# Patient Record
Sex: Female | Born: 1985 | Race: White | Hispanic: No | Marital: Single | Smoking: Current every day smoker
Health system: Southern US, Community
[De-identification: ages and names within clinical notes are randomized; demographics above are authoritative.]

## PROBLEM LIST (undated history)

## (undated) DIAGNOSIS — R319 Hematuria, unspecified: Secondary | ICD-10-CM

## (undated) DIAGNOSIS — M6282 Rhabdomyolysis: Principal | ICD-10-CM

## (undated) DIAGNOSIS — M6289 Other specified disorders of muscle: Secondary | ICD-10-CM

## (undated) DIAGNOSIS — Z87898 Personal history of other specified conditions: Secondary | ICD-10-CM

## (undated) DIAGNOSIS — R011 Cardiac murmur, unspecified: Secondary | ICD-10-CM

## (undated) DIAGNOSIS — I96 Gangrene, not elsewhere classified: Secondary | ICD-10-CM

## (undated) DIAGNOSIS — F1910 Other psychoactive substance abuse, uncomplicated: Secondary | ICD-10-CM

## (undated) DIAGNOSIS — F419 Anxiety disorder, unspecified: Secondary | ICD-10-CM

## (undated) HISTORY — PX: BACK SURGERY: SHX140

---
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
## 2006-03-11 ENCOUNTER — Emergency Department (HOSPITAL_COMMUNITY): Admission: EM | Admit: 2006-03-11 | Discharge: 2006-03-12 | Payer: Self-pay | Admitting: Emergency Medicine

## 2006-05-21 ENCOUNTER — Emergency Department (HOSPITAL_COMMUNITY): Admission: EM | Admit: 2006-05-21 | Discharge: 2006-05-21 | Payer: Self-pay | Admitting: Emergency Medicine

## 2006-08-02 ENCOUNTER — Emergency Department (HOSPITAL_COMMUNITY): Admission: EM | Admit: 2006-08-02 | Discharge: 2006-08-02 | Payer: Self-pay | Admitting: Emergency Medicine

## 2006-08-06 ENCOUNTER — Inpatient Hospital Stay (HOSPITAL_COMMUNITY): Admission: EM | Admit: 2006-08-06 | Discharge: 2006-08-10 | Payer: Self-pay | Admitting: Emergency Medicine

## 2006-08-11 ENCOUNTER — Observation Stay (HOSPITAL_COMMUNITY): Admission: EM | Admit: 2006-08-11 | Discharge: 2006-08-12 | Payer: Self-pay | Admitting: Emergency Medicine

## 2007-03-21 ENCOUNTER — Ambulatory Visit: Payer: Self-pay | Admitting: Obstetrics & Gynecology

## 2007-03-21 ENCOUNTER — Inpatient Hospital Stay (HOSPITAL_COMMUNITY): Admission: AD | Admit: 2007-03-21 | Discharge: 2007-03-23 | Payer: Self-pay | Admitting: Obstetrics & Gynecology

## 2007-11-03 ENCOUNTER — Emergency Department (HOSPITAL_COMMUNITY): Admission: EM | Admit: 2007-11-03 | Discharge: 2007-11-03 | Payer: Self-pay | Admitting: Emergency Medicine

## 2008-05-31 ENCOUNTER — Emergency Department (HOSPITAL_COMMUNITY): Admission: EM | Admit: 2008-05-31 | Discharge: 2008-05-31 | Payer: Self-pay | Admitting: Emergency Medicine

## 2008-07-17 ENCOUNTER — Emergency Department (HOSPITAL_COMMUNITY): Admission: EM | Admit: 2008-07-17 | Discharge: 2008-07-17 | Payer: Self-pay | Admitting: Emergency Medicine

## 2008-09-30 ENCOUNTER — Emergency Department (HOSPITAL_COMMUNITY): Admission: EM | Admit: 2008-09-30 | Discharge: 2008-09-30 | Payer: Self-pay | Admitting: Emergency Medicine

## 2008-11-13 ENCOUNTER — Emergency Department (HOSPITAL_COMMUNITY): Admission: EM | Admit: 2008-11-13 | Discharge: 2008-11-13 | Payer: Self-pay | Admitting: Emergency Medicine

## 2009-03-03 ENCOUNTER — Emergency Department (HOSPITAL_COMMUNITY): Admission: EM | Admit: 2009-03-03 | Discharge: 2009-03-03 | Payer: Self-pay | Admitting: Emergency Medicine

## 2009-03-04 ENCOUNTER — Other Ambulatory Visit: Admission: RE | Admit: 2009-03-04 | Discharge: 2009-03-04 | Payer: Self-pay | Admitting: Obstetrics & Gynecology

## 2009-05-14 ENCOUNTER — Emergency Department: Payer: Self-pay | Admitting: Emergency Medicine

## 2010-01-20 ENCOUNTER — Emergency Department: Payer: Self-pay | Admitting: Emergency Medicine

## 2010-04-23 ENCOUNTER — Emergency Department (HOSPITAL_COMMUNITY)
Admission: EM | Admit: 2010-04-23 | Discharge: 2010-04-23 | Payer: Self-pay | Source: Home / Self Care | Admitting: Emergency Medicine

## 2010-05-01 ENCOUNTER — Emergency Department (HOSPITAL_COMMUNITY)
Admission: EM | Admit: 2010-05-01 | Discharge: 2010-05-01 | Payer: Self-pay | Source: Home / Self Care | Admitting: Emergency Medicine

## 2010-06-06 ENCOUNTER — Emergency Department (HOSPITAL_COMMUNITY)
Admission: EM | Admit: 2010-06-06 | Discharge: 2010-06-06 | Disposition: A | Discharge status: Home / Self Care | Payer: Self-pay | Attending: Emergency Medicine | Admitting: Emergency Medicine

## 2010-06-06 ENCOUNTER — Emergency Department (HOSPITAL_COMMUNITY): Payer: Self-pay

## 2010-06-06 DIAGNOSIS — S0990XA Unspecified injury of head, initial encounter: Secondary | ICD-10-CM | POA: Insufficient documentation

## 2010-06-06 DIAGNOSIS — Y92009 Unspecified place in unspecified non-institutional (private) residence as the place of occurrence of the external cause: Secondary | ICD-10-CM | POA: Insufficient documentation

## 2010-06-06 DIAGNOSIS — R51 Headache: Secondary | ICD-10-CM | POA: Insufficient documentation

## 2010-06-06 DIAGNOSIS — M542 Cervicalgia: Secondary | ICD-10-CM | POA: Insufficient documentation

## 2010-07-16 LAB — CBC
HCT: 34.1 % — ABNORMAL LOW (ref 36.0–46.0)
MCV: 90.3 fL (ref 78.0–100.0)
Platelets: 203 10*3/uL (ref 150–400)
RBC: 3.77 MIL/uL — ABNORMAL LOW (ref 3.87–5.11)
WBC: 10 10*3/uL (ref 4.0–10.5)

## 2010-07-16 LAB — TYPE AND SCREEN
ABO/RH(D): O POS
Antibody Screen: NEGATIVE

## 2010-07-16 LAB — DIFFERENTIAL
Eosinophils Absolute: 0.3 10*3/uL (ref 0.0–0.7)
Eosinophils Relative: 3 % (ref 0–5)
Lymphocytes Relative: 19 % (ref 12–46)
Lymphs Abs: 1.9 10*3/uL (ref 0.7–4.0)
Monocytes Relative: 6 % (ref 3–12)
Neutrophils Relative %: 73 % (ref 43–77)

## 2010-07-16 LAB — URINALYSIS, ROUTINE W REFLEX MICROSCOPIC
Glucose, UA: NEGATIVE mg/dL
Ketones, ur: NEGATIVE mg/dL
Leukocytes, UA: NEGATIVE
Protein, ur: NEGATIVE mg/dL
pH: 6.5 (ref 5.0–8.0)

## 2010-07-16 LAB — URINE MICROSCOPIC-ADD ON

## 2010-07-19 LAB — URINALYSIS, ROUTINE W REFLEX MICROSCOPIC
Glucose, UA: 250 mg/dL — AB
Nitrite: POSITIVE — AB
Protein, ur: 100 mg/dL — AB
Urobilinogen, UA: 4 mg/dL — ABNORMAL HIGH (ref 0.0–1.0)

## 2010-07-19 LAB — URINE MICROSCOPIC-ADD ON

## 2010-07-19 LAB — URINE CULTURE: Colony Count: 60000

## 2010-07-19 LAB — PREGNANCY, URINE: Preg Test, Ur: NEGATIVE

## 2010-07-23 LAB — URINALYSIS, ROUTINE W REFLEX MICROSCOPIC
Bilirubin Urine: NEGATIVE
Glucose, UA: NEGATIVE mg/dL
Ketones, ur: NEGATIVE mg/dL
Nitrite: NEGATIVE
Protein, ur: 30 mg/dL — AB
Specific Gravity, Urine: 1.022 (ref 1.005–1.030)
Urobilinogen, UA: 1 mg/dL (ref 0.0–1.0)
pH: 7.5 (ref 5.0–8.0)

## 2010-07-23 LAB — URINE MICROSCOPIC-ADD ON

## 2010-07-29 LAB — URINALYSIS, ROUTINE W REFLEX MICROSCOPIC
Bilirubin Urine: NEGATIVE
Glucose, UA: NEGATIVE mg/dL
Hgb urine dipstick: NEGATIVE
Ketones, ur: NEGATIVE mg/dL
Protein, ur: NEGATIVE mg/dL
Urobilinogen, UA: 0.2 mg/dL (ref 0.0–1.0)

## 2010-07-29 LAB — COMPREHENSIVE METABOLIC PANEL
ALT: 20 U/L (ref 0–35)
AST: 27 U/L (ref 0–37)
CO2: 25 mEq/L (ref 19–32)
Chloride: 105 mEq/L (ref 96–112)
Creatinine, Ser: 0.89 mg/dL (ref 0.4–1.2)
GFR calc Af Amer: >60 mL/min (ref >60)
GFR calc non Af Amer: >60 mL/min (ref >60)
Glucose, Bld: 161 mg/dL — ABNORMAL HIGH (ref 70–99)
Total Bilirubin: 0.3 mg/dL (ref 0.3–1.2)

## 2010-07-29 LAB — CBC
Hemoglobin: 13.9 g/dL (ref 12.0–15.0)
MCV: 92.3 fL (ref 78.0–100.0)
RBC: 4.43 MIL/uL (ref 3.87–5.11)
WBC: 14.6 10*3/uL — ABNORMAL HIGH (ref 4.0–10.5)

## 2010-07-29 LAB — DIFFERENTIAL
Basophils Absolute: 0 10*3/uL (ref 0.0–0.1)
Eosinophils Absolute: 0 10*3/uL (ref 0.0–0.7)
Eosinophils Relative: 0 % (ref 0–5)
Lymphocytes Relative: 9 % — ABNORMAL LOW (ref 12–46)
Neutrophils Relative %: 90 % — ABNORMAL HIGH (ref 43–77)

## 2010-08-27 ENCOUNTER — Emergency Department (HOSPITAL_COMMUNITY)
Admission: EM | Admit: 2010-08-27 | Discharge: 2010-08-27 | Disposition: A | Discharge status: Home / Self Care | Payer: Self-pay | Attending: Emergency Medicine | Admitting: Emergency Medicine

## 2010-08-27 DIAGNOSIS — K089 Disorder of teeth and supporting structures, unspecified: Secondary | ICD-10-CM | POA: Insufficient documentation

## 2010-08-29 NOTE — Group Therapy Note (Signed)
   NAME:  Chelsea May, Chelsea May                     ACCOUNT NO.:  [id number]   MEDICAL RECORD NO.:  [id number]                   PATIENT TYPE:  INP   LOCATION:  A328                                 FACILITY:  [facility]   PHYSICIAN:  Angus G. Renard Matter, M.D.              DATE OF BIRTH:  07/06/1985   DATE OF PROCEDURE:  01/29/2003  DATE OF DISCHARGE:                                   PROGRESS NOTE   SUBJECTIVE:  This patient was admitted with viral gastroenteritis and  remains on IV fluids.  Apparently, she was admitted with vomiting and  diarrhea.   OBJECTIVE:  VITAL SIGNS:  Blood pressure 95/54, respirations 20, pulse 86,  temperature 29.  HEART:  Regular rhythm.  LUNGS:  Clear to P&A.  ABDOMEN:  No palpable organs and masses.   ASSESSMENT:  Gastroenteritis, viral.   PLAN:  Continue current regimen.      ___________________________________________                                            Ishmael Holter. Renard Matter, M.D.   AGM/MEDQ  D:  01/29/2003  T:  01/29/2003  Job:  [id number]

## 2010-08-29 NOTE — Op Note (Signed)
Chelsea May, Chelsea May           ACCOUNT NO.:  [id number]   MEDICAL RECORD NO.:  [id number]          PATIENT TYPE:  INP   LOCATION:  LDR4                          FACILITY:  [facility]   PHYSICIAN:  Tilda Burrow, M.D. DATE OF BIRTH:  February 08, 1986   DATE OF PROCEDURE:  09/30/2005  DATE OF DISCHARGE:                                  PROCEDURE NOTE   Length of first stage labor 7 hours 5 minutes.  Length of second stage labor  1 hour 1 minute.  Length of third stage labor 6 minutes.   DELIVERY NOTE:  [name] had a normal spontaneous vaginal delivery at 1605 of a  viable female infant.  Following delivery of the head, there was a spontaneous  retraction of shoulders and the infant delivered without difficulty.  Following delivery, the nose and mouth were thoroughly suctioned.  The cord  was clamped and cut.  Infant to mother's abdomen for newborn care.  The  infant had a strong cry, good movement of all extremities, and pinked up  well without difficulty.  Apgars were 9 and 9.  Upon inspection, the  perineum was noted to be intact.  Cord blood gas and cord blood was obtained  and sent to the laboratory.  The third stage of labor was actually managed  with 20 units of Pitocin and 1000 mL of D5LR to a rapid rate.  The placenta  was delivered spontaneously.  Three vessel cord is noted on inspection and  membranes are noted to be intact on inspection.  Estimated blood loss  approximately 350 mL.  The epidural catheter was removed.  The infant and  mother stabilized and transferred up to the postpartum unit in stable  condition.      Zerita Boers, Lanier Clam      Tilda Burrow, M.D.  Electronically Signed    DL/MEDQ  D:  16/01/9603  T:  09/30/2005  Job:  [id number]

## 2010-08-29 NOTE — Op Note (Signed)
NAMEEZMA, REHM           ACCOUNT NO.:  [id number]   MEDICAL RECORD NO.:  [id number]          PATIENT TYPE:  INP   LOCATION:  A412                          FACILITY:  [facility]   PHYSICIAN:  Lazaro Arms, M.D.   DATE OF BIRTH:  1985-06-19   DATE OF PROCEDURE:  09/30/2005  DATE OF DISCHARGE:  10/02/2005                                 OPERATIVE REPORT   PROCEDURE:  Epidural.   Abeni is a 25 year old, gravida 1, para 0 whose in active phase of labor  requesting an epidural be placed. She is placed in sitting position,  Betadine prep is used, 1% lidocaine is injected into the L3-4 interspace. A  17 gauge Tuohy needle was used and loss of resistance technique employed and  the epidural space found with one pass without difficulty. 10 mL of 0.125%  bupivacaine plain was given as a test dose, __________. The epidural  catheter is then fed 5 cm in the epidural space. An additional 10 mL is then  given. It is taped down and a continuous infusion begun at 12 mL/hour. The  patient tolerated it well and was given good pain relief and the blood  pressure stable.      Lazaro Arms, M.D.  Electronically Signed     LHE/MEDQ  D:  11/05/2005  T:  11/05/2005  Job:  [id number]

## 2010-08-29 NOTE — Discharge Summary (Signed)
Chelsea May, Chelsea May           ACCOUNT NO.:  [id number]   MEDICAL RECORD NO.:  [id number]          PATIENT TYPE:  INP   LOCATION:  A418                          FACILITY:  [facility]   PHYSICIAN:  Tilda Burrow, M.D. DATE OF BIRTH:  09-Oct-1985   DATE OF ADMISSION:  08/11/2006  DATE OF DISCHARGE:  05/01/2008LH                               DISCHARGE SUMMARY   ADMISSION DIAGNOSIS:  1. Pregnancy at seven weeks gestation.  2. Recurrent hyperemesis gravidarum.  3. Dehydration secondary to hyperemesis.   DISCHARGE DIAGNOSES:  1. Pregnancy at seven weeks gestation.  2. Recurrent hyperemesis gravidarum.  3. Dehydration secondary to hyperemesis, improved.   DISCHARGE MEDICATIONS:  1. Phenergan (promethazine) 25 mg p.o. q.6h. p.r.n. nausea.  2. Reglan 10 mg p.o. 30 minutes before meals.  3. Ondansetron (Zofran) 8 mg p.o. q.12h. p.r.n. nausea.  4. Protonix 40 mg p.o. daily for nausea.   HOSPITAL COURSE:  This 25 year old female was readmitted for recurrent  nausea and vomiting shortly after a recent hospitalization.  The patient  is very anxious about how she is doing with the pregnancy, has concerns  about her long term well being.  She was admitted and general exam on  admission showed a petite, Caucasian female in mild to moderate  discomfort with weight of 109 pounds (49 kilograms).  The patient had  laboratory evaluation including urinalysis 1.020, urine ketones greater  than 80 mg/dl and no evidence of urinary tract infection.  She was  admitted and received IV Reglan, Zofran and Phenergan.  She had  scopolamine transdermal patch placed behind the ear.  She did not  receive Robinul as she was not particularly having any problem with  salivation.  She received vigorous fluid hydration over the first two  hours, at first two liters of fluid and reduced fluids after that.  She  quickly was able to resume attempts at oral intake on 08/12/2006 and was  discharged on the afternoon of  08/12/2006 after maintaining oral intake  for lunch.   Followup will be in two weeks at [facility] or earlier p.r.n.  recurrence nausea and vomiting and dehydration.      Tilda Burrow, M.D.  Electronically Signed    JVF/MEDQ  D:  08/25/2006  T:  08/25/2006  Job:  [id number]

## 2010-08-29 NOTE — Discharge Summary (Signed)
Chelsea May, Chelsea May           ACCOUNT NO.:  [id number]   MEDICAL RECORD NO.:  [id number]          PATIENT TYPE:  INP   LOCATION:  A428                          FACILITY:  [facility]   PHYSICIAN:  Lazaro Arms, M.D.   DATE OF BIRTH:  [birth year]   DATE OF ADMISSION:  08/06/2006  DATE OF DISCHARGE:  04/29/2008LH                               DISCHARGE SUMMARY   DISCHARGE DIAGNOSES:  1. Intrauterine pregnancy at [redacted] weeks gestation.  2. Hyperemesis gravidarum with dehydration and metabolic disturbance.  3. Strong history of excessive nausea and vomiting in the past, even      when not pregnant.   Please refer to Dr. Rayna Sexton History and Physical for details of  admission to hospital.   HOSPITAL COURSE:  The patient was admitted, had a low potassium course.  All other labs were basically normal.  TSH was also normal.  Patient was  rehydrated, had a lot of nausea and vomiting initially with dry heaving  and some green bile, but subsequently that has significantly diminished  to just mostly in the morning.  She is keeping down liquids, Sprite,  water, popcicles.  She is encouraged to continue this at home.  She is  being maintained on Zofran and Phenergan, Protonix and Reglan.  She does  not have excessive spitting or ptyalism by history or from what I see in  the room.  Her exams have been normal.  We will discharge her home this  morning.  I went in the room and asked her did she want to go home and  she stated yes, so we will discharge her home on her Zofran, Phenergan,  Reglan and Protonix.  We will see her back in the office in a week to  see how she is doing.  It is noted she has already been to the ER three  times with this pregnancy with dehydration, so I am not sure how long  she will be kept on outpatient therapy, but we did go over diet  restrictions and how to try to manage this at home.  I have also  encouraged her to suck unlimited hard candy during the day.  I will see  her back next week.      Lazaro Arms, M.D.  Electronically Signed     LHE/MEDQ  D:  08/10/2006  T:  08/10/2006  Job:  [id number]

## 2010-08-29 NOTE — H&P (Signed)
NAME:  Chelsea May, PETRALIA                     ACCOUNT NO.:  [id number]   MEDICAL RECORD NO.:  [id number]                   PATIENT TYPE:  INP   LOCATION:  A328                                 FACILITY:  [facility]   PHYSICIAN:  Mila Homer. Sudie Bailey, M.D.           DATE OF BIRTH:  08/30/1985   DATE OF ADMISSION:  01/28/2003  DATE OF DISCHARGE:                                HISTORY & PHYSICAL   HISTORY OF PRESENT ILLNESS:  This 25 year old girl became sick around 2 this  morning.  She and her 5 year old brother had been visiting friends.  They  had all eaten together, eating [place] food.  She and her brother both  became sick about the same time, but none of the others became sick.  Mother  notes that the family are visiting, had what apparently was a stomach virus  last week and this affected all of them.  The patient had generally been  healthy.  She has no major medical problems, no surgery.   She was having severe vomiting and diarrhea at home.  She has had watery  stools.  Mom said she has never seen her sicker.   She was extensively vomiting in the emergency room.  She seemed to respond  to Compazine for her nausea.   PHYSICAL EXAMINATION:  GENERAL:  Examination on the floor showed a sleepy 59-  year-old.  Height 62 inches.  Weight 105.2 pounds.  VITAL SIGNS:  Temperature 99.1, pulse 81, respiratory rate 16, blood  pressure 105/54.  HEART:  Regular rhythm, rate of 70 on my examination.  LUNGS:  Clear throughout.  ABDOMEN:  Soft without hepatosplenomegaly, mass or tenderness.  EXTREMITIES:  At the time of my exam, there is no edema of the ankles.   LABORATORY DATA:  Admission blood work showed a white cell count 14,600 with  92% neutrophils, 5 lymphs.  Urine was cloudy with a specific gravity of  1.025, pH greater than 9 with greater than 80 ketones and 0-2 WBC's.   ASSESSMENT:  Initial assessment includes:  1. Viral gastroenteritis.  2. Dehydration.   PLAN:  Plan of  treatment includes IV fluids 125 cc/hr., 20 mg of KCl per  liter, Compazine 5 mg IV q.3h., Tylenol p.o., clear liquid diet.   Her mom says she seemed to get somewhat redder after having the Compazine.  We will go back to Phenergan 25 mg IV.    NOTE:  I came in on a Sunday night to see this patient and time of workup  including review of the paper record, review of the electrolyte record,  discussion with her mom, examination of the patient, formulating a plan,  dictation of note was 45 minutes.         ___________________________________________  Mila Homer. Sudie Bailey, M.D.   SDK/MEDQ  D:  01/28/2003  T:  01/29/2003  Job:  [id number]

## 2010-08-29 NOTE — Discharge Summary (Signed)
   NAME:  Chelsea May, Chelsea May                     ACCOUNT NO.:  [id number]   MEDICAL RECORD NO.:  [id number]                   PATIENT TYPE:  INP   LOCATION:  A328                                 FACILITY:  [facility]   PHYSICIAN:  Angus G. Renard Matter, M.D.              DATE OF BIRTH:  09-24-1985   DATE OF ADMISSION:  01/28/2003  DATE OF DISCHARGE:  01/29/2003                                 DISCHARGE SUMMARY   This patient was admitted January 28, 2003 and discharged January 29, 2003  for a 1-day hospitalization.   DIAGNOSES:  1. Viral gastroenteritis.  2. Dehydration.   CONDITION:  Stable and improved at the time of discharge.   HISTORY:  This 25 year old female became sick on the day of admission.  The  patient had experienced a stomach virus which had affected all of them  during the week prior to her admission.  She began vomiting and having water  stools.  She was admitted through the emergency room.  She seemed to respond  to Compazine.   OBJECTIVE:  VITAL SIGNS: Blood pressure 105/54, respirations 16, pulse 81,  temperature 99.1.  HEENT:  Eyes PERRLA.  TMs negative.  Oropharynx benign.  ABDOMEN: No palpable organs or masses.   LABORATORY DATA:  Admission CBC: WBC was 14,600; hemoglobin 12.7; hematocrit  36.6; 92 neutrophils 5 lymphocytes.  Chemistry: Sodium 137, potassium 3.6,  chloride 109, CO2 20, glucose 123, BUN 10, creatinine 0.8, calcium 9.4.  Urinalysis negative.  X-rays:  Acute abdominal series relatively gas-less  appearance of abdomen.  No visible dilated bowel loops or fluid levels.   HOSPITAL COURSE:  At the time of this patient's admission she was placed on  IV normal saline 200 cc per hour.  She was given IV Phenergan 12.5 mg q.4h.  p.r.n. for nausea, 20 mEq of KCl was added to each liter of fluid.  She was  given Compazine 5 mg IV q.3h. p.r.n. for nausea.  The patient showed  progressive improvement during hospital stay.  She was changed to a soft  diet on  January 29, 2003 and was subsequently discharged to be followed as  an outpatient. The patient was discharged on Imodium p.r.n.  Phenergan 25 mg  q.4h. p.r.n.  It was felt that the patient had suffered a viral  gastroenteritis.     ___________________________________________                                         Ishmael Holter. Renard Matter, M.D.   AGM/MEDQ  D:  02/09/2003  T:  02/09/2003  Job:  [id number]

## 2010-08-29 NOTE — H&P (Signed)
NAMEMARJAN, Chelsea May           ACCOUNT NO.:  [id number]   MEDICAL RECORD NO.:  [id number]          PATIENT TYPE:  INP   LOCATION:  A418                          FACILITY:  [facility]   PHYSICIAN:  Tilda Burrow, M.D. DATE OF BIRTH:  07-06-1985   DATE OF ADMISSION:  08/11/2006  DATE OF DISCHARGE:  LH                              HISTORY & PHYSICAL   ADMISSION DIAGNOSES:  1. Pregnancy at 7 weeks' gestation.  2. Hyperemesis gravidarum, recurrent.   HISTORY OF PRESENT ILLNESS:  This 25 year old, G2, P1 was readmitted  only 36 hours after recent discharge for hyperemesis.  [name] presents to  the emergency room claiming to have not kept anything at all down for  the past 2 days while she was at home.  While she was in the hospital,  she acknowledges she was able to keep down popsicles and liquids.  She  states that has not been possible at home.  The first day she was home,  her mother was with her at home.  Today, she was by herself caring for  her infant.  She was very anxious about how she is going to do with the  pregnancy asking if she is going to die, even though she is obviously  well-hydrated, alert, oriented, ambulatory with no problems other than  the vomiting at the present time.  When seen in the emergency room, she  had initial assessment with a temperature of 98.4, blood pressure  130/70, respirations 20 with weight recorded at 48 kg with 100% oxygen  saturation on room air.  Laboratory evaluation included urine specific  gravity 1.020 with greater than 80 mg/dl of ketonuria.  Urinalysis was  otherwise negative.  The patient is highly anxious that she will be sent  home and requests that she be admitted for fluids and reattempt p.o.  intake.  Tomorrow, her caregiver options will be her husband who will be  off of work for the next 2 days.  He is willing to consider caring for  her at home if she is able to take adequate p.o. to go home.   PAST MEDICAL HISTORY:   Benign.   SOCIAL HISTORY:  Nonsmoker, nondrinker and no drug abuse.  She lives  with supportive family.  Husband works daily.  She is at home caring for  her 81-month-old.   PHYSICAL EXAMINATION:  GENERAL:  An anxious, slim, Caucasian female in  no acute distress, oriented x3.  Respirations 20-24, deep and unlabored.  She has been vomiting watery liquid consistent with p.o. intake.  There  is no bile in this.  CARDIAC:  Unremarkable.  ABDOMEN:  Scaphoid with no guarding or rebound.  PELVIC:  She has prior documented ultrasound showing intrauterine  pregnancy with fetal heart motion present.   IMPRESSION:  Recurrent hyperemesis.   PLAN:  Restart IV Phenergan, IV promethazine, IV ondansetron, IV  Protonix 40 mg daily with daily weights and vigorous fluid hydrate  overnight.  Will attempt to keep hospitalization brief.  The patient  will need GI consult this admission.  As we plan future strategy, she  may be a candidate  for a midline catheter for long-term IV access.      Tilda Burrow, M.D.  Electronically Signed     JVF/MEDQ  D:  08/11/2006  T:  08/12/2006  Job:  [id number]   cc:   [facility]

## 2010-08-29 NOTE — H&P (Signed)
NAMETHAO, BAUZA           ACCOUNT NO.:  [id number]   MEDICAL RECORD NO.:  [id number]          PATIENT TYPE:  INP   LOCATION:  LDR4                          FACILITY:  [facility]   PHYSICIAN:  Tilda Burrow, M.D. DATE OF BIRTH:  [birth year]   DATE OF ADMISSION:  09/30/2005  DATE OF DISCHARGE:  LH                                HISTORY & PHYSICAL   REASON FOR ADMISSION:  Pregnancy at 38 weeks with active labor.   HISTORY OF PRESENT ILLNESS:  Inari presented in active labor, 5-6 cm, to  labor and delivery.   PAST MEDICAL HISTORY:  Negative.   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   ALLERGIES:  No known drug allergies.   MEDICATIONS:  She is not taking any medication.   PHYSICAL EXAMINATION:  VITAL SIGNS:  Stable.  Fetal heart rate is stable  with accelerations.  PELVIC:  Cervix is 5-6 cm.  Membranes are intact and bulging.   Prenatal course was essentially uneventful.  Toxo positive, UDS was positive  for THC.  Rubella is immune.  Hepatitis B surface antigen is negative, HIV  is negative, HSV is negative, serology is nonreactive.  GC and  Chlamydia are negative on both cultures.  AFP normal.  GBS was negative.  28-  week hemoglobin 11.5, 28-week hematocrit 33.1, 1-hour glucose 140.   PLAN:  We are going to admit and expect vaginal delivery.      Zerita Boers, Lanier Clam      Tilda Burrow, M.D.  Electronically Signed    DL/MEDQ  D:  56/21/3086  T:  09/30/2005  Job:  [id number]   cc:   [facility]   Jeoffrey Massed, MD  Fax: [phone]

## 2010-08-29 NOTE — H&P (Signed)
Chelsea May, Chelsea May           ACCOUNT NO.:  [id number]   MEDICAL RECORD NO.:  [id number]          PATIENT TYPE:  INP   LOCATION:  A428                          FACILITY:  [facility]   PHYSICIAN:  Tilda Burrow, M.D. DATE OF BIRTH:  [birth year]   DATE OF ADMISSION:  08/06/2006  DATE OF DISCHARGE:  LH                              HISTORY & PHYSICAL   ADMITTING DIAGNOSIS:  Pregnancy 6 weeks' gestation, hyperemesis  gravidarum.   HISTORY OF PRESENT ILLNESS:  This 25 year old gravida 2, para 1 is seen  in the emergency room the second time this week complaining of  persistent nausea and vomiting.  She was seen on Monday, August 02, 2006.  Received IV fluid hydration and was sent home.  She now has similar  complaints with the patient presenting at 10 a.m. and receiving fluids  for three hours.  Presenting with blood pressure 106/64, pulse 124,  respirations 20, temperature 97.8, O2 sat 100% on room air.  She  received IV fluid hydration.  Review of old records includes ultrasound  from earlier.  Quantitative HCG of 71,196.  Potassium 3, BUN 5,  creatinine 0.8.  White count 6400, hemoglobin 11, hematocrit 32.  Urinalysis shows specific gravity of 1.030 with 3+ ketonuria.  She is  admitted for continued hydration and antiemetics after continued to  vomit and remaining nauseated after three hours of fluid hydration.   PAST MEDICAL HISTORY:  Benign.   ALLERGIES:  Negative.   SURGICAL HISTORY:  Negative.   SOCIAL HISTORY:  Nonsmoker, nondrinker, former smoker, not smoking in  the present.   PHYSICAL EXAMINATION:  GENERAL:  Shows a petite Caucasian female.  VITAL SIGNS:  Weight approximately 110.  HEENT:  Pupils equal, round, reactive.  CARDIOVASCULAR:  Unremarkable.  ABDOMEN:  Bowel sounds present, watery peristalsis appreciable easily.  No guarding or rebound tenderness.  PELVIC:  Ultrasound earlier this week showed intrauterine pregnancy  consistent with 6 weeks.   PLAN:  Admit.   Antiemetics.  Vigorous fluid hydration.  Anticipate a 1-2  day stay.      Tilda Burrow, M.D.  Electronically Signed     JVF/MEDQ  D:  08/07/2006  T:  08/07/2006  Job:  [id number]

## 2010-08-29 NOTE — Group Therapy Note (Signed)
NAMEPIERRETTE, SCHEU           ACCOUNT NO.:  [id number]   MEDICAL RECORD NO.:  [id number]           PATIENT TYPE:  OIB   LOCATION:  LDR1                          FACILITY:  [facility]   PHYSICIAN:  Richardean Canal, M.D.  DATE OF BIRTH:  Feb 02, 1986   DATE OF PROCEDURE:  09/25/2005  DATE OF DISCHARGE:  09/25/2005                                   PROGRESS NOTE   PROCEDURE:  An NST interpretation.   This patient was seen in labor and delivery at [redacted] weeks gestation with  complaint of leaking fluid. The patient was assessed by the labor and  delivery nurse and noted to have negative Nitrazine. The patient was placed  on the fetal monitor, where the fetal heart pattern was reactive. There were  contractions indicative of uterine irritability but not a labor pattern. The  patient received terbutaline 0.25 mg subcutaneously, resulting in  disappearance of the uterine irritability and patient comfort. The patient  was discharged home in satisfactory condition.           ______________________________  Richardean Canal, M.D.     RW/MEDQ  D:  09/26/2005  T:  09/26/2005  Job:  [id number]

## 2010-11-23 ENCOUNTER — Emergency Department (HOSPITAL_COMMUNITY)
Admission: EM | Admit: 2010-11-23 | Discharge: 2010-11-23 | Disposition: A | Discharge status: Home / Self Care | Payer: Self-pay | Attending: Emergency Medicine | Admitting: Emergency Medicine

## 2010-11-23 DIAGNOSIS — K0889 Other specified disorders of teeth and supporting structures: Secondary | ICD-10-CM

## 2010-11-23 DIAGNOSIS — T24139A Burn of first degree of unspecified lower leg, initial encounter: Secondary | ICD-10-CM | POA: Insufficient documentation

## 2010-11-23 DIAGNOSIS — T300 Burn of unspecified body region, unspecified degree: Secondary | ICD-10-CM

## 2010-11-23 DIAGNOSIS — K029 Dental caries, unspecified: Secondary | ICD-10-CM

## 2010-11-23 DIAGNOSIS — X19XXXA Contact with other heat and hot substances, initial encounter: Secondary | ICD-10-CM | POA: Insufficient documentation

## 2010-11-23 MED ORDER — PENICILLIN V POTASSIUM 250 MG PO TABS: 250.0000 mg | ORAL_TABLET | Freq: Four times a day (QID) | ORAL | Status: AC

## 2010-11-23 MED ORDER — OXYCODONE-ACETAMINOPHEN 5-325 MG PO TABS: ORAL_TABLET | ORAL | Status: AC
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
## 2010-12-21 ENCOUNTER — Emergency Department (HOSPITAL_COMMUNITY)
Admission: EM | Admit: 2010-12-21 | Discharge: 2010-12-21 | Disposition: A | Discharge status: Home / Self Care | Payer: Self-pay | Attending: Emergency Medicine | Admitting: Emergency Medicine

## 2010-12-21 ENCOUNTER — Encounter (HOSPITAL_COMMUNITY): Payer: Self-pay

## 2010-12-21 DIAGNOSIS — K089 Disorder of teeth and supporting structures, unspecified: Secondary | ICD-10-CM | POA: Insufficient documentation

## 2010-12-21 DIAGNOSIS — K047 Periapical abscess without sinus: Secondary | ICD-10-CM | POA: Insufficient documentation

## 2010-12-21 DIAGNOSIS — K029 Dental caries, unspecified: Secondary | ICD-10-CM | POA: Insufficient documentation

## 2010-12-21 DIAGNOSIS — F17200 Nicotine dependence, unspecified, uncomplicated: Secondary | ICD-10-CM | POA: Insufficient documentation

## 2010-12-21 MED ORDER — AMOXICILLIN 500 MG PO CAPS: 500.0000 mg | ORAL_CAPSULE | Freq: Three times a day (TID) | ORAL | Status: AC

## 2010-12-21 MED ORDER — OXYCODONE-ACETAMINOPHEN 5-325 MG PO TABS: 1.0000 | ORAL_TABLET | Freq: Four times a day (QID) | ORAL | Status: AC | PRN

## 2010-12-21 MED ORDER — AMOXICILLIN 250 MG PO CAPS
500.0000 mg | ORAL_CAPSULE | Freq: Once | ORAL | Status: AC
  Administered 2010-12-21: 500 mg via ORAL
  Filled 2010-12-21: qty 2

## 2010-12-21 MED ORDER — OXYCODONE-ACETAMINOPHEN 5-325 MG PO TABS
1.0000 | ORAL_TABLET | Freq: Once | ORAL | Status: AC
  Administered 2010-12-21: 1 via ORAL
  Filled 2010-12-21: qty 1
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
## 2010-12-21 NOTE — ED Notes (Signed)
Pt presents with broken tooth to right upper jaw and broken tooth to left lower jaw. Left lower tooth is rubbing on tongue. Pt also states she feels like nerves are exposed on the tooth on the right. Pt being seen by health dept.

## 2010-12-22 NOTE — ED Provider Notes (Signed)
History     CSN: [id number] Arrival date & time: 12/21/2010  8:18 PM  Chief Complaint  Patient presents with  . Dental Pain   Patient is a 25 y.o. female presenting with tooth pain. The history is provided by the patient.  Dental PainThe primary symptoms include mouth pain. Primary symptoms do not include headaches, fever, shortness of breath or sore throat. The symptoms began yesterday. The symptoms are worsening. The symptoms occur constantly.  Additional symptoms include: dental sensitivity to temperature, gum swelling and gum tenderness. Additional symptoms do not include: jaw pain, facial swelling, ear pain and swollen glands.    Past Medical History  Diagnosis Date  . Brain damage   . Murmur   . Asthma   . Bronchitis   . Cancer     Past Surgical History  Procedure Date  . Back surgery     History reviewed. No pertinent family history.  History  Substance Use Topics  . Smoking status: Current Everyday Smoker -- 0.5 packs/day  . Smokeless tobacco: Not on file  . Alcohol Use: Yes     occ    OB History    Grav Para Term Preterm Abortions TAB SAB Ect Mult Living                  Review of Systems  Constitutional: Negative for fever.  HENT: Positive for dental problem. Negative for ear pain, congestion, sore throat, facial swelling and neck pain.   Eyes: Negative.   Respiratory: Negative for chest tightness and shortness of breath.   Cardiovascular: Negative for chest pain.  Gastrointestinal: Negative for nausea and abdominal pain.  Genitourinary: Negative.   Musculoskeletal: Negative for joint swelling and arthralgias.  Skin: Negative.  Negative for rash and wound.  Neurological: Negative for dizziness, weakness, light-headedness, numbness and headaches.  Hematological: Negative.   Psychiatric/Behavioral: Negative.     Physical Exam  BP 101/71  Pulse 120  Temp(Src) 98.4 F (36.9 C) (Oral)  Resp 16  Ht 5\' 2"  (1.575 m)  Wt 124 lb (56.246 kg)  BMI 22.68  kg/m2  SpO2 100%  Physical Exam  Nursing note and vitals reviewed. Constitutional: She is oriented to person, place, and time. She appears well-developed and well-nourished. No distress.  HENT:  Head: Normocephalic and atraumatic.  Right Ear: Tympanic membrane and external ear normal.  Left Ear: Tympanic membrane and external ear normal.  Mouth/Throat: Oropharynx is clear and moist and mucous membranes are normal. No oral lesions. Dental abscesses present.    Eyes: Conjunctivae are normal.  Neck: Normal range of motion. Neck supple.  Cardiovascular: Normal rate, regular rhythm, normal heart sounds and intact distal pulses.   Pulmonary/Chest: Effort normal and breath sounds normal. She has no wheezes.  Abdominal: Soft. Bowel sounds are normal. She exhibits no distension. There is no tenderness.  Musculoskeletal: Normal range of motion.  Lymphadenopathy:    She has no cervical adenopathy.  Neurological: She is alert and oriented to person, place, and time.  Skin: Skin is warm and dry. No erythema.  Psychiatric: She has a normal mood and affect.    ED Course  Procedures  MDM Dental abscess/decay.      Candis Musa, PA 12/22/10 [phone]

## 2010-12-22 NOTE — ED Provider Notes (Signed)
Medical screening examination/treatment/procedure(s) were performed by non-physician practitioner and as supervising physician I was immediately available for consultation/collaboration.  Donnetta Hutching, MD 12/22/10 2206

## 2011-01-11 ENCOUNTER — Emergency Department: Payer: Self-pay | Admitting: Emergency Medicine

## 2011-01-19 LAB — RPR: RPR Ser Ql: NONREACTIVE

## 2011-01-19 LAB — CBC
HCT: 34.7 — ABNORMAL LOW
MCHC: 35.1
MCV: 94.4
Platelets: 311
RDW: 12.4

## 2011-01-19 LAB — LACTATE DEHYDROGENASE: LDH: 186

## 2011-01-19 LAB — URIC ACID: Uric Acid, Serum: 3.6

## 2011-01-19 LAB — COMPREHENSIVE METABOLIC PANEL
Albumin: 2.8 — ABNORMAL LOW
Alkaline Phosphatase: 159 — ABNORMAL HIGH
BUN: 8
Creatinine, Ser: 0.67
Glucose, Bld: 82
Total Protein: 6

## 2011-01-20 ENCOUNTER — Emergency Department (HOSPITAL_COMMUNITY)
Admission: EM | Admit: 2011-01-20 | Discharge: 2011-01-20 | Disposition: A | Discharge status: Home / Self Care | Payer: Self-pay | Attending: Emergency Medicine | Admitting: Emergency Medicine

## 2011-01-20 ENCOUNTER — Encounter (HOSPITAL_COMMUNITY): Payer: Self-pay | Admitting: *Deleted

## 2011-01-20 DIAGNOSIS — F17200 Nicotine dependence, unspecified, uncomplicated: Secondary | ICD-10-CM | POA: Insufficient documentation

## 2011-01-20 DIAGNOSIS — K089 Disorder of teeth and supporting structures, unspecified: Secondary | ICD-10-CM | POA: Insufficient documentation

## 2011-01-20 DIAGNOSIS — K0889 Other specified disorders of teeth and supporting structures: Secondary | ICD-10-CM

## 2011-01-20 DIAGNOSIS — K029 Dental caries, unspecified: Secondary | ICD-10-CM | POA: Insufficient documentation

## 2011-01-20 MED ORDER — OXYCODONE-ACETAMINOPHEN 5-325 MG PO TABS: ORAL_TABLET | ORAL | Status: DC

## 2011-01-20 MED ORDER — OXYCODONE-ACETAMINOPHEN 5-325 MG PO TABS
1.0000 | ORAL_TABLET | Freq: Once | ORAL | Status: AC
  Administered 2011-01-20: 1 via ORAL
  Filled 2011-01-20: qty 1

## 2011-01-20 MED ORDER — IBUPROFEN 800 MG PO TABS
800.0000 mg | ORAL_TABLET | Freq: Once | ORAL | Status: AC
  Administered 2011-01-20: 800 mg via ORAL
  Filled 2011-01-20: qty 1

## 2011-01-20 MED ORDER — PENICILLIN V POTASSIUM 500 MG PO TABS: 500.0000 mg | ORAL_TABLET | Freq: Four times a day (QID) | ORAL | Status: AC

## 2011-01-20 MED ORDER — PENICILLIN V POTASSIUM 250 MG PO TABS
500.0000 mg | ORAL_TABLET | Freq: Once | ORAL | Status: AC
  Administered 2011-01-20: 500 mg via ORAL
  Filled 2011-01-20: qty 2

## 2011-01-20 NOTE — ED Notes (Signed)
Pt states she gritted her teeth today and a tooth in the back right lower jaw line. Pt also c/o pain to right upper jaw.

## 2011-01-20 NOTE — ED Provider Notes (Signed)
History     CSN: [id number] Arrival date & time: 01/20/2011  2:50 PM  Chief Complaint  Patient presents with  . Dental Pain    (Consider location/radiation/quality/duration/timing/severity/associated sxs/prior treatment) HPI Comments: Pt has an appt at a dental clinic but not until dec 2012.  Patient is a 25 y.o. female presenting with tooth pain. The history is provided by the patient. No language interpreter was used.  Dental PainThe primary symptoms include mouth pain. Primary symptoms do not include dental injury. The symptoms began 2 days ago. The symptoms are worsening. The symptoms occur constantly.  Additional symptoms include: dental sensitivity to temperature.    Past Medical History  Diagnosis Date  . Brain damage   . Murmur   . Asthma   . Bronchitis   . Cancer     Past Surgical History  Procedure Date  . Back surgery     History reviewed. No pertinent family history.  History  Substance Use Topics  . Smoking status: Current Everyday Smoker -- 0.5 packs/day  . Smokeless tobacco: Not on file  . Alcohol Use: Yes     occ    OB History    Grav Para Term Preterm Abortions TAB SAB Ect Mult Living                  Review of Systems  All other systems reviewed and are negative.    Allergies  Hydrocodone  Home Medications   Current Outpatient Rx  Name Route Sig Dispense Refill  . IBUPROFEN 200 MG PO TABS Oral Take 800 mg by mouth 3 (three) times daily as needed. For dental pain     . ETONOGESTREL 68 MG [place] IMPL Subcutaneous Inject 1 each into the skin once.      . MEGESTROL ACETATE 40 MG PO TABS Oral Take 120 mg by mouth daily.        BP 123/66  Pulse 91  Temp(Src) 98 F (36.7 C) (Oral)  Resp 18  SpO2 100%  Physical Exam  Nursing note and vitals reviewed. Constitutional: She is oriented to person, place, and time. Vital signs are normal. She appears well-developed and well-nourished. No distress.  HENT:  Head: Normocephalic and atraumatic.    Right Ear: External ear normal.  Left Ear: External ear normal.  Nose: Nose normal.  Mouth/Throat: Uvula is midline. Normal dentition. Dental caries present. No dental abscesses or uvula swelling. No oropharyngeal exudate.    Eyes: Conjunctivae and EOM are normal. Pupils are equal, round, and reactive to light. Right eye exhibits no discharge. Left eye exhibits no discharge. No scleral icterus.  Neck: Normal range of motion. Neck supple. No JVD present. No tracheal deviation present. No thyromegaly present.  Cardiovascular: Normal rate, regular rhythm, normal heart sounds, intact distal pulses and normal pulses.  Exam reveals no gallop and no friction rub.   No murmur heard. Pulmonary/Chest: Effort normal and breath sounds normal. No stridor. No respiratory distress. She has no wheezes. She has no rales. She exhibits no tenderness.  Abdominal: Soft. Normal appearance and bowel sounds are normal. She exhibits no distension and no mass. There is no tenderness. There is no rebound and no guarding.  Musculoskeletal: Normal range of motion. She exhibits no edema and no tenderness.  Lymphadenopathy:    She has no cervical adenopathy.  Neurological: She is alert and oriented to person, place, and time. She has normal reflexes. Coordination normal. GCS eye subscore is 4. GCS verbal subscore is 5. GCS motor subscore  is 6.  Skin: Skin is warm and dry. No rash noted. She is not diaphoretic.  Psychiatric: She has a normal mood and affect. Her speech is normal and behavior is normal. Judgment and thought content normal. Cognition and memory are normal.    ED Course  Procedures (including critical care time)  Labs Reviewed - No data to display No results found.   No diagnosis found.    MDM          Worthy Rancher, PA 01/20/11 [phone]

## 2011-01-29 NOTE — ED Provider Notes (Signed)
Medical screening examination/treatment/procedure(s) were performed by non-physician practitioner and as supervising physician I was immediately available for consultation/collaboration.  Geoffery Lyons, MD 01/29/11 1321

## 2011-02-17 ENCOUNTER — Emergency Department: Payer: Self-pay | Admitting: *Deleted

## 2011-03-27 ENCOUNTER — Emergency Department (HOSPITAL_COMMUNITY)
Admission: EM | Admit: 2011-03-27 | Discharge: 2011-03-27 | Disposition: A | Discharge status: Home / Self Care | Payer: Self-pay | Attending: Emergency Medicine | Admitting: Emergency Medicine

## 2011-03-27 ENCOUNTER — Encounter (HOSPITAL_COMMUNITY): Payer: Self-pay

## 2011-03-27 DIAGNOSIS — K0381 Cracked tooth: Secondary | ICD-10-CM | POA: Insufficient documentation

## 2011-03-27 DIAGNOSIS — K089 Disorder of teeth and supporting structures, unspecified: Secondary | ICD-10-CM | POA: Insufficient documentation

## 2011-03-27 DIAGNOSIS — K0889 Other specified disorders of teeth and supporting structures: Secondary | ICD-10-CM

## 2011-03-27 DIAGNOSIS — F17200 Nicotine dependence, unspecified, uncomplicated: Secondary | ICD-10-CM | POA: Insufficient documentation

## 2011-03-27 DIAGNOSIS — Z859 Personal history of malignant neoplasm, unspecified: Secondary | ICD-10-CM | POA: Insufficient documentation

## 2011-03-27 MED ORDER — OXYCODONE-ACETAMINOPHEN 5-325 MG PO TABS
1.0000 | ORAL_TABLET | Freq: Once | ORAL | Status: AC
  Administered 2011-03-27: 1 via ORAL
  Filled 2011-03-27: qty 1

## 2011-03-27 MED ORDER — PENICILLIN V POTASSIUM 250 MG PO TABS
500.0000 mg | ORAL_TABLET | Freq: Once | ORAL | Status: AC
  Administered 2011-03-27: 500 mg via ORAL
  Filled 2011-03-27: qty 2

## 2011-03-27 MED ORDER — PENICILLIN V POTASSIUM 500 MG PO TABS: 500.0000 mg | ORAL_TABLET | Freq: Four times a day (QID) | ORAL | Status: AC

## 2011-03-27 MED ORDER — IBUPROFEN 800 MG PO TABS
800.0000 mg | ORAL_TABLET | Freq: Once | ORAL | Status: AC
  Administered 2011-03-27: 800 mg via ORAL
  Filled 2011-03-27: qty 1

## 2011-03-27 MED ORDER — OXYCODONE-ACETAMINOPHEN 5-325 MG PO TABS: ORAL_TABLET | ORAL | Status: DC

## 2011-03-27 NOTE — ED Notes (Signed)
Pt c/o toothache to right upper tooth located on right upper side in very back, pt states that the tooth broke last pm while eating butter brittle, pt also has toothache to left lower side located in very back, pt states that she had [facility] to see dental clinic but she did make an appointment with the dental clinic for two weeks from now,

## 2011-03-27 NOTE — ED Provider Notes (Signed)
History     CSN: [id number] Arrival date & time: 03/27/2011  1:32 PM   First MD Initiated Contact with Patient 03/27/11 1549      Chief Complaint  Patient presents with  . Dental Pain    (Consider location/radiation/quality/duration/timing/severity/associated sxs/prior treatment) HPI Comments: Eating candy last PM and broke tooth.  Patient is a 25 y.o. female presenting with tooth pain. The history is provided by the patient. No language interpreter was used.  Dental PainThe primary symptoms include mouth pain and dental injury. Episode onset: yest. The symptoms are unchanged. The symptoms occur constantly.    Past Medical History  Diagnosis Date  . Brain damage   . Murmur   . Asthma   . Bronchitis   . Cancer     Past Surgical History  Procedure Date  . Back surgery     No family history on file.  History  Substance Use Topics  . Smoking status: Current Everyday Smoker -- 0.5 packs/day  . Smokeless tobacco: Not on file  . Alcohol Use: Yes     occ    OB History    Grav Para Term Preterm Abortions TAB SAB Ect Mult Living                  Review of Systems  HENT: Positive for dental problem.   All other systems reviewed and are negative.    Allergies  Hydrocodone  Home Medications   Current Outpatient Rx  Name Route Sig Dispense Refill  . MEGESTROL ACETATE 40 MG PO TABS Oral Take 120 mg by mouth daily.      . ETONOGESTREL 68 MG [place] IMPL Subcutaneous Inject 1 each into the skin once.        BP 122/73  Pulse 94  Temp(Src) 97.5 F (36.4 C) (Oral)  Resp 18  Ht 5\' 3"  (1.6 m)  Wt 121 lb (54.885 kg)  BMI 21.43 kg/m2  SpO2 100%  Physical Exam  Nursing note and vitals reviewed. Constitutional: She is oriented to person, place, and time. She appears well-developed and well-nourished. No distress.  HENT:  Head: Normocephalic and atraumatic.  Mouth/Throat: Uvula is midline. Abnormal dentition.    Eyes: EOM are normal.  Neck: Normal range of  motion.  Cardiovascular: Normal rate, regular rhythm and normal heart sounds.   Pulmonary/Chest: Effort normal and breath sounds normal.  Abdominal: Soft. She exhibits no distension. There is no tenderness.  Musculoskeletal: Normal range of motion.  Neurological: She is alert and oriented to person, place, and time.  Skin: Skin is warm and dry.  Psychiatric: She has a normal mood and affect. Judgment normal.    ED Course  Procedures (including critical care time)  Labs Reviewed - No data to display No results found.   No diagnosis found.    MDM          Worthy Rancher, PA 03/27/11 [phone]

## 2011-03-27 NOTE — ED Provider Notes (Signed)
Medical screening examination/treatment/procedure(s) were performed by non-physician practitioner and as supervising physician I was immediately available for consultation/collaboration.  Rollie Hynek, MD 03/27/11 2047 

## 2011-03-27 NOTE — ED Notes (Signed)
Pt presents with right upper dental pain. Pt states she broke her tooth on candy yesterday. Pt was at [facility] but the dental dept was closed.

## 2011-05-19 ENCOUNTER — Other Ambulatory Visit (HOSPITAL_COMMUNITY)
Admission: RE | Admit: 2011-05-19 | Discharge: 2011-05-19 | Disposition: A | Discharge status: Home / Self Care | Payer: Self-pay | Source: Ambulatory Visit | Attending: Unknown Physician Specialty | Admitting: Unknown Physician Specialty

## 2011-05-19 ENCOUNTER — Other Ambulatory Visit: Payer: Self-pay | Admitting: Nurse Practitioner

## 2011-05-19 ENCOUNTER — Other Ambulatory Visit (HOSPITAL_COMMUNITY)
Admission: RE | Admit: 2011-05-19 | Discharge: 2011-05-19 | Disposition: A | Discharge status: Home / Self Care | Payer: Self-pay | Source: Ambulatory Visit | Attending: Family Medicine | Admitting: Family Medicine

## 2011-05-19 DIAGNOSIS — N870 Mild cervical dysplasia: Secondary | ICD-10-CM | POA: Insufficient documentation

## 2011-05-19 DIAGNOSIS — Z01419 Encounter for gynecological examination (general) (routine) without abnormal findings: Secondary | ICD-10-CM | POA: Insufficient documentation

## 2011-05-19 DIAGNOSIS — B977 Papillomavirus as the cause of diseases classified elsewhere: Secondary | ICD-10-CM | POA: Insufficient documentation

## 2011-06-05 ENCOUNTER — Encounter (HOSPITAL_COMMUNITY): Payer: Self-pay | Admitting: *Deleted

## 2011-06-05 ENCOUNTER — Inpatient Hospital Stay (HOSPITAL_COMMUNITY)
Admission: AD | Admit: 2011-06-05 | Discharge: 2011-06-08 | DRG: 897 | Disposition: A | Discharge status: Home / Self Care | Payer: 59 | Source: Ambulatory Visit | Attending: Psychiatry | Admitting: Psychiatry

## 2011-06-05 ENCOUNTER — Emergency Department (HOSPITAL_COMMUNITY)
Admission: EM | Admit: 2011-06-05 | Discharge: 2011-06-05 | Disposition: A | Discharge status: Other Acute Inpatient Hospital | Payer: Self-pay | Attending: Emergency Medicine | Admitting: Emergency Medicine

## 2011-06-05 DIAGNOSIS — F1910 Other psychoactive substance abuse, uncomplicated: Secondary | ICD-10-CM

## 2011-06-05 DIAGNOSIS — F17200 Nicotine dependence, unspecified, uncomplicated: Secondary | ICD-10-CM

## 2011-06-05 DIAGNOSIS — J45990 Exercise induced bronchospasm: Secondary | ICD-10-CM

## 2011-06-05 DIAGNOSIS — F1110 Opioid abuse, uncomplicated: Secondary | ICD-10-CM

## 2011-06-05 DIAGNOSIS — Z888 Allergy status to other drugs, medicaments and biological substances status: Secondary | ICD-10-CM

## 2011-06-05 DIAGNOSIS — F1120 Opioid dependence, uncomplicated: Secondary | ICD-10-CM | POA: Insufficient documentation

## 2011-06-05 DIAGNOSIS — Z859 Personal history of malignant neoplasm, unspecified: Secondary | ICD-10-CM

## 2011-06-05 DIAGNOSIS — K3184 Gastroparesis: Secondary | ICD-10-CM

## 2011-06-05 DIAGNOSIS — F1210 Cannabis abuse, uncomplicated: Secondary | ICD-10-CM | POA: Insufficient documentation

## 2011-06-05 DIAGNOSIS — J45909 Unspecified asthma, uncomplicated: Secondary | ICD-10-CM | POA: Insufficient documentation

## 2011-06-05 DIAGNOSIS — Z79899 Other long term (current) drug therapy: Secondary | ICD-10-CM

## 2011-06-05 HISTORY — DX: Other psychoactive substance abuse, uncomplicated: F19.10

## 2011-06-05 LAB — BASIC METABOLIC PANEL
CO2: 27 mEq/L (ref 19–32)
Calcium: 10 mg/dL (ref 8.4–10.5)
Creatinine, Ser: 0.73 mg/dL (ref 0.50–1.10)
GFR calc non Af Amer: >90 mL/min (ref >90)
Glucose, Bld: 126 mg/dL — ABNORMAL HIGH (ref 70–99)

## 2011-06-05 LAB — DIFFERENTIAL
Eosinophils Absolute: 0.1 10*3/uL (ref 0.0–0.7)
Eosinophils Relative: 2 % (ref 0–5)
Lymphocytes Relative: 31 % (ref 12–46)
Lymphs Abs: 1.5 10*3/uL (ref 0.7–4.0)
Monocytes Absolute: 0.5 10*3/uL (ref 0.1–1.0)

## 2011-06-05 LAB — RAPID URINE DRUG SCREEN, HOSP PERFORMED
Amphetamines: NOT DETECTED
Benzodiazepines: NOT DETECTED
Opiates: POSITIVE — AB

## 2011-06-05 LAB — CBC
HCT: 36.6 % (ref 36.0–46.0)
MCH: 32.3 pg (ref 26.0–34.0)
MCV: 95.3 fL (ref 78.0–100.0)
Platelets: 204 10*3/uL (ref 150–400)
RBC: 3.84 MIL/uL — ABNORMAL LOW (ref 3.87–5.11)
RDW: 13.1 % (ref 11.5–15.5)
WBC: 4.9 10*3/uL (ref 4.0–10.5)

## 2011-06-05 MED ORDER — METHOCARBAMOL 500 MG PO TABS: 500.0000 mg | ORAL_TABLET | Freq: Three times a day (TID) | ORAL | Status: DC | PRN

## 2011-06-05 MED ORDER — LORAZEPAM 1 MG PO TABS
1.0000 mg | ORAL_TABLET | Freq: Once | ORAL | Status: AC
  Administered 2011-06-05: 1 mg via ORAL
  Filled 2011-06-05: qty 1

## 2011-06-05 MED ORDER — CLONIDINE HCL 0.1 MG PO TABS
0.1000 mg | ORAL_TABLET | Freq: Four times a day (QID) | ORAL | Status: AC
  Administered 2011-06-05 – 2011-06-06 (×2): 0.1 mg via ORAL
  Filled 2011-06-05 (×8): qty 1

## 2011-06-05 MED ORDER — MAGNESIUM HYDROXIDE 400 MG/5ML PO SUSP: 30.0000 mL | Freq: Every day | ORAL | Status: DC | PRN

## 2011-06-05 MED ORDER — ACETAMINOPHEN 325 MG PO TABS
650.0000 mg | ORAL_TABLET | Freq: Four times a day (QID) | ORAL | Status: DC | PRN
  Administered 2011-06-08: 650 mg via ORAL

## 2011-06-05 MED ORDER — LOPERAMIDE HCL 2 MG PO CAPS
2.0000 mg | ORAL_CAPSULE | ORAL | Status: DC | PRN
  Administered 2011-06-06 (×2): 2 mg via ORAL

## 2011-06-05 MED ORDER — ALUM & MAG HYDROXIDE-SIMETH 200-200-20 MG/5ML PO SUSP: 30.0000 mL | ORAL | Status: DC | PRN

## 2011-06-05 MED ORDER — NAPROXEN 500 MG PO TABS
500.0000 mg | ORAL_TABLET | Freq: Two times a day (BID) | ORAL | Status: DC | PRN
  Administered 2011-06-06: 500 mg via ORAL
  Filled 2011-06-05: qty 1

## 2011-06-05 MED ORDER — LOPERAMIDE HCL 2 MG PO CAPS: 2.0000 mg | ORAL_CAPSULE | ORAL | Status: DC | PRN

## 2011-06-05 MED ORDER — DICYCLOMINE HCL 20 MG PO TABS
20.0000 mg | ORAL_TABLET | ORAL | Status: DC | PRN
  Administered 2011-06-06: 20 mg via ORAL
  Filled 2011-06-05: qty 1

## 2011-06-05 MED ORDER — HYDROXYZINE HCL 25 MG PO TABS
25.0000 mg | ORAL_TABLET | Freq: Four times a day (QID) | ORAL | Status: DC | PRN
  Administered 2011-06-06 – 2011-06-07 (×5): 25 mg via ORAL

## 2011-06-05 MED ORDER — DIPHENHYDRAMINE HCL 50 MG PO CAPS
50.0000 mg | ORAL_CAPSULE | Freq: Once | ORAL | Status: AC
  Administered 2011-06-05: 50 mg via ORAL
  Filled 2011-06-05: qty 1

## 2011-06-05 MED ORDER — CLONIDINE HCL 0.1 MG PO TABS
0.1000 mg | ORAL_TABLET | ORAL | Status: DC
  Administered 2011-06-06: 0.1 mg via ORAL
  Filled 2011-06-05 (×2): qty 1

## 2011-06-05 MED ORDER — HYDROXYZINE HCL 25 MG PO TABS: 25.0000 mg | ORAL_TABLET | Freq: Four times a day (QID) | ORAL | Status: DC | PRN

## 2011-06-05 MED ORDER — ONDANSETRON 4 MG PO TBDP: 4.0000 mg | ORAL_TABLET | Freq: Four times a day (QID) | ORAL | Status: DC | PRN

## 2011-06-05 MED ORDER — NAPROXEN 500 MG PO TABS: 500.0000 mg | ORAL_TABLET | Freq: Two times a day (BID) | ORAL | Status: DC | PRN

## 2011-06-05 MED ORDER — ONDANSETRON 8 MG PO TBDP
8.0000 mg | ORAL_TABLET | Freq: Once | ORAL | Status: AC
  Administered 2011-06-05: 8 mg via ORAL
  Filled 2011-06-05: qty 1

## 2011-06-05 MED ORDER — CLONIDINE HCL 0.1 MG PO TABS: 0.1000 mg | ORAL_TABLET | Freq: Every day | ORAL | Status: DC

## 2011-06-05 MED ORDER — DICYCLOMINE HCL 20 MG PO TABS: 20.0000 mg | ORAL_TABLET | ORAL | Status: DC | PRN

## 2011-06-05 MED ORDER — ONDANSETRON 4 MG PO TBDP
4.0000 mg | ORAL_TABLET | Freq: Four times a day (QID) | ORAL | Status: DC | PRN
  Administered 2011-06-06 – 2011-06-08 (×5): 4 mg via ORAL
  Filled 2011-06-05 (×3): qty 1

## 2011-06-05 NOTE — ED Notes (Signed)
[facility] called earlier for transport to [facility]. 

## 2011-06-05 NOTE — Progress Notes (Signed)
Patient ID: Chelsea May, female   DOB: 02-Nov-1985, 26 y.o.   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 Reports using for the last three years but use has escalated over the last five months. Last used around 4 am on 06/05/11. Patient is starting to have withdrawal symptoms and complains of feeling very irritable. Was able to cooperative well with the admission process. Denies SI/HI. Oriented to the 300 hall unit and routine.
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
## 2011-06-05 NOTE — ED Provider Notes (Signed)
History     CSN: [id number]  Arrival date & time 06/05/11  1318   First MD Initiated Contact with Patient 06/05/11 1329      Chief Complaint  Patient presents with  . V70.1    HPI Pt was seen at 1345.  Per pt, c/o gradual onset and persistence of constant narcotic abuse for the past 3 years.  Pt requesting detox today.  Pt states she went to [facility] and was told to come to the ED for further eval.  Pt states her LD oxycontin was this morning.  Endorses she has tried to detox on her own but she "gets too sick."  Denies SI/SA, no HI.  Denies abd pain, no N/V/D, no fevers, no CP/SOB.       Past Medical History  Diagnosis Date  . Brain damage   . Murmur   . Asthma   . Bronchitis   . Cancer   . Substance abuse     Past Surgical History  Procedure Date  . Back surgery     History  Substance Use Topics  . Smoking status: Current Everyday Smoker -- 0.5 packs/day  . Smokeless tobacco: Not on file  . Alcohol Use: Yes     occ    Review of Systems ROS: Statement: All systems negative except as marked or noted in the HPI; Constitutional: Negative for fever and chills. ; ; Eyes: Negative for eye pain, redness and discharge. ; ; ENMT: Negative for ear pain, hoarseness, nasal congestion, sinus pressure and sore throat. ; ; Cardiovascular: Negative for chest pain, palpitations, diaphoresis, dyspnea and peripheral edema. ; ; Respiratory: Negative for cough, wheezing and stridor. ; ; Gastrointestinal: Negative for nausea, vomiting, diarrhea, abdominal pain, blood in stool, hematemesis, jaundice and rectal bleeding. . ; ; Genitourinary: Negative for dysuria, flank pain and hematuria. ; ; Musculoskeletal: Negative for back pain and neck pain. Negative for swelling and trauma.; ; Skin: Negative for pruritus, rash, abrasions, blisters, bruising and skin lesion.; ; Neuro: Negative for headache, lightheadedness and neck stiffness. Negative for weakness, altered level of consciousness , altered  mental status, extremity weakness, paresthesias, involuntary movement, seizure and syncope.; Psych:  No SI, no SA, no HI, no hallucinations.      Allergies  Hydrocodone  Home Medications   Current Outpatient Rx  Name Route Sig Dispense Refill  . ETONOGESTREL 68 MG [place] IMPL Subcutaneous Inject 1 each into the skin once.      . MEGESTROL ACETATE 20 MG PO TABS Oral Take 20 mg by mouth daily. Patient is supposed to take 2 tablets 3xdaily to equal dose of 120mg , has only been taking for a little over a week, then is supposed to taper down to 1 tablet daily over a period of 6 weeks    . OXYCODONE-ACETAMINOPHEN 5-325 MG PO TABS  One po QID prn pain 15 tablet 0    BP 106/53  Pulse 89  Temp(Src) 98 F (36.7 C) (Oral)  Resp 20  Ht 5\' 3"  (1.6 m)  Wt 125 lb (56.7 kg)  BMI 22.14 kg/m2  SpO2 98%  Physical Exam 1350: Physical examination:  Nursing notes reviewed; Vital signs and O2 SAT reviewed;  Constitutional: Well developed, Well nourished, Well hydrated, In no acute distress; Head:  Normocephalic, atraumatic; Eyes: EOMI, PERRL, No scleral icterus; ENMT: Mouth and pharynx normal, Mucous membranes moist; Neck: Supple, Full range of motion, No lymphadenopathy; Cardiovascular: Regular rate and rhythm, No murmur, rub, or gallop; Respiratory: Breath sounds clear &  equal bilaterally, No rales, rhonchi, wheezes, or rub, Normal respiratory effort/excursion; Chest: Nontender, Movement normal; Extremities: Pulses normal, No tenderness, No edema, No calf edema or asymmetry.; Neuro: AA&Ox3, Major CN grossly intact.  No gross focal motor or sensory deficits in extremities.; Skin: Color normal, Warm, Dry; Psych:  Calm, cooperative, no SI.     ED Course  Procedures   3:01 PM:  ACT Santina Evans will come to eval pt.  Pt denies any withdrawal symptoms currently.   5:45 PM:  Pt accepted to [facility], will transfer stable.    MDM  MDM Reviewed: nursing note and vitals Interpretation: labs   Results for orders  placed during the hospital encounter of 06/05/11  URINE RAPID DRUG SCREEN (HOSP PERFORMED)      Component Value Range   Opiates POSITIVE (*) NONE DETECTED    Cocaine NONE DETECTED  NONE DETECTED    Benzodiazepines NONE DETECTED  NONE DETECTED    Amphetamines NONE DETECTED  NONE DETECTED    Tetrahydrocannabinol POSITIVE (*) NONE DETECTED    Barbiturates NONE DETECTED  NONE DETECTED   CBC      Component Value Range   WBC 4.9  4.0 - 10.5 (K/uL)   RBC 3.84 (*) 3.87 - 5.11 (MIL/uL)   Hemoglobin 12.4  12.0 - 15.0 (g/dL)   HCT 78.2  95.6 - 21.3 (%)   MCV 95.3  78.0 - 100.0 (fL)   MCH 32.3  26.0 - 34.0 (pg)   MCHC 33.9  30.0 - 36.0 (g/dL)   RDW 08.6  57.8 - 46.9 (%)   Platelets 204  150 - 400 (K/uL)  DIFFERENTIAL      Component Value Range   Neutrophils Relative 57  43 - 77 (%)   Neutro Abs 2.8  1.7 - 7.7 (K/uL)   Lymphocytes Relative 31  12 - 46 (%)   Lymphs Abs 1.5  0.7 - 4.0 (K/uL)   Monocytes Relative 9  3 - 12 (%)   Monocytes Absolute 0.5  0.1 - 1.0 (K/uL)   Eosinophils Relative 2  0 - 5 (%)   Eosinophils Absolute 0.1  0.0 - 0.7 (K/uL)   Basophils Relative 0  0 - 1 (%)   Basophils Absolute 0.0  0.0 - 0.1 (K/uL)  BASIC METABOLIC PANEL      Component Value Range   Sodium 137  135 - 145 (mEq/L)   Potassium 4.2  3.5 - 5.1 (mEq/L)   Chloride 100  96 - 112 (mEq/L)   CO2 27  19 - 32 (mEq/L)   Glucose, Bld 126 (*) 70 - 99 (mg/dL)   BUN 13  6 - 23 (mg/dL)   Creatinine, Ser 6.29  0.50 - 1.10 (mg/dL)   Calcium 52.8  8.4 - 10.5 (mg/dL)   GFR calc non Af Amer >90  >90 (mL/min)   GFR calc Af Amer >90  >90 (mL/min)  ETHANOL      Component Value Range   Alcohol, Ethyl (B) <11  0 - 11 (mg/dL)            Laray Anger, DO 06/08/11 1216

## 2011-06-05 NOTE — ED Notes (Signed)
[facility] unable to take report at this time.  

## 2011-06-05 NOTE — ED Notes (Signed)
Report given to laura davis

## 2011-06-05 NOTE — ED Notes (Signed)
States she is here for medical detox, states she uses oxycontin

## 2011-06-06 DIAGNOSIS — F1110 Opioid abuse, uncomplicated: Secondary | ICD-10-CM

## 2011-06-06 MED ORDER — TRAZODONE HCL 50 MG PO TABS
50.0000 mg | ORAL_TABLET | Freq: Every evening | ORAL | Status: DC | PRN
  Administered 2011-06-06 – 2011-06-07 (×3): 50 mg via ORAL
  Filled 2011-06-06 (×3): qty 1

## 2011-06-06 MED ORDER — NICOTINE 21 MG/24HR TD PT24
MEDICATED_PATCH | TRANSDERMAL | Status: AC
  Filled 2011-06-06: qty 1

## 2011-06-06 MED ORDER — NICOTINE 21 MG/24HR TD PT24
21.0000 mg | MEDICATED_PATCH | Freq: Every day | TRANSDERMAL | Status: DC
  Administered 2011-06-06 – 2011-06-08 (×3): 21 mg via TRANSDERMAL
  Filled 2011-06-06 (×3): qty 1
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
## 2011-06-06 NOTE — Progress Notes (Signed)
06/06/2011 10:09 AM                         Case Management Group:                                     Pt did not attend after care planning group. Darlin Stenseth, LPCA

## 2011-06-06 NOTE — Progress Notes (Addendum)
   [facility] Adult Inpatient Family/Significant Other Suicide Prevention Education  Suicide Prevention Education:  Patient Refusal for Family/Significant Other Suicide Prevention Education: The patient JENIPHER HAVEL has refused to provide written consent for family/significant other to be provided Family/Significant Other Suicide Prevention Education during admission and/or prior to discharge.  Physician notified.  Casimira Sutphin 06/06/2011, 10:30 AM
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
## 2011-06-06 NOTE — Progress Notes (Signed)
Pt has been out in milieu this shift and attending groups. Explained detox protocol medications and usual withdrawal symptoms for days 1-3.  C/O nausea,cramps,body aches and anxiety and prn medications requested and received.  Denies SI.  Reports poor sleep and low energy. Rates depression and hopelessness at 4.  Verbal support given. Heat packs offered. 15' checks cont for safety.

## 2011-06-06 NOTE — H&P (Signed)
Chelsea May is an 26 y.o. female.   Chief Complaint: wants medically supported detox from opiates. HPI: Was prescribed Opioids after a dental procedure and liked how it made her feel . Has been buying from the street and will loose her children if she doesn't clean up.   Past Medical History  Diagnosis Date  . Brain damage   . Murmur   . Asthma   . Bronchitis   . Cancer   . Substance abuse     Past Surgical History  Procedure Date  . Back surgery     No family history on file. Social History:  reports that she has been smoking.  She does not have any smokeless tobacco history on file. She reports that she drinks alcohol. She reports that she does not use illicit drugs.  Allergies:  Allergies  Allergen Reactions  . Hydrocodone Itching    Medications Prior to Admission  Medication Dose Route Frequency Provider Last Rate Last Dose  . acetaminophen (TYLENOL) tablet 650 mg  650 mg Oral Q6H PRN Nehemiah Settle, MD      . alum & mag hydroxide-simeth (MAALOX/MYLANTA) 200-200-20 MG/5ML suspension 30 mL  30 mL Oral Q4H PRN Nehemiah Settle, MD      . cloNIDine (CATAPRES) tablet 0.1 mg  0.1 mg Oral QID Nehemiah Settle, MD   0.1 mg at 06/06/11 0820   Followed by  . cloNIDine (CATAPRES) tablet 0.1 mg  0.1 mg Oral BH-qamhs Nehemiah Settle, MD       Followed by  . cloNIDine (CATAPRES) tablet 0.1 mg  0.1 mg Oral QAC breakfast Nehemiah Settle, MD      . dicyclomine (BENTYL) tablet 20 mg  20 mg Oral Q4H PRN Nehemiah Settle, MD   20 mg at 06/06/11 1130  . diphenhydrAMINE (BENADRYL) capsule 50 mg  50 mg Oral Once Nehemiah Settle, MD   50 mg at 06/05/11 2148  . hydrOXYzine (ATARAX/VISTARIL) tablet 25 mg  25 mg Oral Q6H PRN Nehemiah Settle, MD   25 mg at 06/06/11 1406  . loperamide (IMODIUM) capsule 2-4 mg  2-4 mg Oral PRN Nehemiah Settle, MD   2 mg at 06/06/11 1405  . LORazepam (ATIVAN) tablet 1 mg  1  mg Oral Once Laray Anger, DO   1 mg at 06/05/11 1540  . magnesium hydroxide (MILK OF MAGNESIA) suspension 30 mL  30 mL Oral Daily PRN Nehemiah Settle, MD      . methocarbamol (ROBAXIN) tablet 500 mg  500 mg Oral Q8H PRN Nehemiah Settle, MD      . naproxen (NAPROSYN) tablet 500 mg  500 mg Oral BID PRN Nehemiah Settle, MD   500 mg at 06/06/11 1131  . nicotine (NICODERM CQ - dosed in mg/24 hours) patch 21 mg  21 mg Transdermal Daily Verne Spurr, PA   21 mg at 06/06/11 1347  . ondansetron (ZOFRAN-ODT) disintegrating tablet 4 mg  4 mg Oral Q6H PRN Nehemiah Settle, MD   4 mg at 06/06/11 4098  . ondansetron (ZOFRAN-ODT) disintegrating tablet 8 mg  8 mg Oral Once Laray Anger, DO   8 mg at 06/05/11 1540  . DISCONTD: dicyclomine (BENTYL) tablet 20 mg  20 mg Oral Q4H PRN Nehemiah Settle, MD      . DISCONTD: hydrOXYzine (ATARAX/VISTARIL) tablet 25 mg  25 mg Oral Q6H PRN Nehemiah Settle, MD      . DISCONTD: loperamide (  IMODIUM) capsule 2-4 mg  2-4 mg Oral PRN Nehemiah Settle, MD      . DISCONTD: methocarbamol (ROBAXIN) tablet 500 mg  500 mg Oral Q8H PRN Nehemiah Settle, MD      . DISCONTD: naproxen (NAPROSYN) tablet 500 mg  500 mg Oral BID PRN Nehemiah Settle, MD      . DISCONTD: ondansetron (ZOFRAN-ODT) disintegrating tablet 4 mg  4 mg Oral Q6H PRN Nehemiah Settle, MD       Medications Prior to Admission  Medication Sig Dispense Refill  . Etonogestrel (IMPLANON) 68 MG IMPL Inject 1 each into the skin once.          Results for orders placed during the hospital encounter of 06/05/11 (from the past 48 hour(s))  URINE RAPID DRUG SCREEN (HOSP PERFORMED)     Status: Abnormal   Collection Time   06/05/11  1:35 PM      Component Value Range Comment   Opiates POSITIVE (*) NONE DETECTED     Cocaine NONE DETECTED  NONE DETECTED     Benzodiazepines NONE DETECTED  NONE DETECTED     Amphetamines NONE  DETECTED  NONE DETECTED     Tetrahydrocannabinol POSITIVE (*) NONE DETECTED     Barbiturates NONE DETECTED  NONE DETECTED    PREGNANCY, URINE     Status: Normal   Collection Time   06/05/11  1:35 PM      Component Value Range Comment   Preg Test, Ur NEGATIVE  NEGATIVE    CBC     Status: Abnormal   Collection Time   06/05/11  1:43 PM      Component Value Range Comment   WBC 4.9  4.0 - 10.5 (K/uL)    RBC 3.84 (*) 3.87 - 5.11 (MIL/uL)    Hemoglobin 12.4  12.0 - 15.0 (g/dL)    HCT 11.9  14.7 - 82.9 (%)    MCV 95.3  78.0 - 100.0 (fL)    MCH 32.3  26.0 - 34.0 (pg)    MCHC 33.9  30.0 - 36.0 (g/dL)    RDW 56.2  13.0 - 86.5 (%)    Platelets 204  150 - 400 (K/uL)   DIFFERENTIAL     Status: Normal   Collection Time   06/05/11  1:43 PM      Component Value Range Comment   Neutrophils Relative 57  43 - 77 (%)    Neutro Abs 2.8  1.7 - 7.7 (K/uL)    Lymphocytes Relative 31  12 - 46 (%)    Lymphs Abs 1.5  0.7 - 4.0 (K/uL)    Monocytes Relative 9  3 - 12 (%)    Monocytes Absolute 0.5  0.1 - 1.0 (K/uL)    Eosinophils Relative 2  0 - 5 (%)    Eosinophils Absolute 0.1  0.0 - 0.7 (K/uL)    Basophils Relative 0  0 - 1 (%)    Basophils Absolute 0.0  0.0 - 0.1 (K/uL)   BASIC METABOLIC PANEL     Status: Abnormal   Collection Time   06/05/11  1:43 PM      Component Value Range Comment   Sodium 137  135 - 145 (mEq/L)    Potassium 4.2  3.5 - 5.1 (mEq/L)    Chloride 100  96 - 112 (mEq/L)    CO2 27  19 - 32 (mEq/L)    Glucose, Bld 126 (*) 70 - 99 (mg/dL)    BUN 13  6 - 23 (mg/dL)    Creatinine, Ser 1.61  0.50 - 1.10 (mg/dL)    Calcium 09.6  8.4 - 10.5 (mg/dL)    GFR calc non Af Amer >90  >90 (mL/min)    GFR calc Af Amer >90  >90 (mL/min)   ETHANOL     Status: Normal   Collection Time   06/05/11  1:43 PM      Component Value Range Comment   Alcohol, Ethyl (B) <11  0 - 11 (mg/dL)    No results found.  Review of Systems  Constitutional: Negative.        Normal withdrawl   HENT: Negative.     Eyes: Negative.   Respiratory: Negative.   Cardiovascular: Negative.   Gastrointestinal: Negative.   Genitourinary: Negative.   Musculoskeletal: Negative.   Skin: Negative.   Neurological: Negative.   Endo/Heme/Allergies: Negative.   Psychiatric/Behavioral: Positive for substance abuse.    Blood pressure 94/54, pulse 84, temperature 98.2 F (36.8 C), temperature source Oral, resp. rate 18, height 5\' 5"  (1.651 m), weight 56.246 kg (124 lb). Physical Exam  Constitutional: She is oriented to person, place, and time. She appears well-developed and well-nourished.  HENT:  Head: Normocephalic and atraumatic.  Right Ear: External ear normal.  Left Ear: External ear normal.  Nose: Nose normal.  Mouth/Throat: Oropharynx is clear and moist.  Eyes: Pupils are equal, round, and reactive to light.  Neck: Normal range of motion. Neck supple.  Cardiovascular: Normal rate and regular rhythm.   Murmur heard. Respiratory: Effort normal and breath sounds normal.  GI: Soft. Bowel sounds are normal.  Genitourinary:       Takes megestrol 120 mg daily   Musculoskeletal: Normal range of motion.  Neurological: She is alert and oriented to person, place, and time. She has normal reflexes.  Skin: Skin is warm and dry.  Psychiatric: She has a normal mood and affect. Her behavior is normal. Judgment and thought content normal.     Assessment/Plan Admit for medically supported opioid detox.   Margarine Grosshans,MICKIE D. 06/06/2011, 5:49 PM

## 2011-06-06 NOTE — Progress Notes (Signed)
Medicated for withdrawal sx  At 02:40  with good results.  Q 16 min safety checks are progress.
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
## 2011-06-06 NOTE — [facility] Suicide Risk Assessment (Signed)
Suicide Risk Assessment  Admission Assessment     Demographic factors:  Assessment Details Time of Assessment: Admission Information Obtained From: Patient Current Mental Status:    Loss Factors:    Historical Factors:  Historical Factors: Family history of mental illness or substance abuse;Impulsivity Risk Reduction Factors:  Risk Reduction Factors: Responsible for children under 25 years of age;Religious beliefs about death;Living with another person, especially a relative;Positive social support  CLINICAL FACTORS:   Alcohol/Substance Abuse/Dependencies  COGNITIVE FEATURES THAT CONTRIBUTE TO RISK:  Polarized thinking    SUICIDE RISK:   Minimal: No identifiable suicidal ideation.  Patients presenting with no risk factors but with morbid ruminations; may be classified as minimal risk based on the severity of the depressive symptoms  PLAN OF CARE: Admitted voluntarily for opioid detoxification.   Lourdez Mcgahan,JANARDHAHA R. 06/06/2011, 1:58 PM

## 2011-06-06 NOTE — Progress Notes (Signed)
Writer observed patient in her room talking to her visitor. Patient was informed writer would speak to her concerning her scheduled medications after her visitor left. Patient came to nursing station shortly after her visitor left c/o feeling nauseated. Writer gave patient zofran before she attended group. Patient reported that she was not looking forward to group earlier but once she attended group she actually was glad she went because it was good. Patient was informed of her scheduled medication and encouraged to drink plenty of fluids to hydrate herself because her blood pressure was slightly low earlier in the day which was why her clonidine was held. Patient was agreeable, she denied having pain, -si/hi/a/v hall. Safety maintained on unit, will continue to monitor.

## 2011-06-07 DIAGNOSIS — F1120 Opioid dependence, uncomplicated: Principal | ICD-10-CM

## 2011-06-07 NOTE — Progress Notes (Signed)
[facility] Group Notes:  (Counselor/Nursing/MHT/Case Management/Adjunct)  06/07/2011 3:21 PM  Type of Therapy:  Group Therapy  Participation Level:  Active  Participation Quality:  Appropriate and Attentive  Affect:  Appropriate  Cognitive:  Appropriate  Insight:  Good  Engagement in Group:  Good  Engagement in Therapy:  Good  Modes of Intervention:  Problem-solving, Support and exploration  Summary of Progress/Problems:  Pt was able to participate in group therapy and explore healthy supports, unhealthy supports, where to find supports and how to be a support to yourself. Pt was able to share that she plans to change people places and things to be a support for herself. Vanetta Mulders, LPCA      Avel Ogawa Garret Reddish 06/07/2011, 3:21 PM

## 2011-06-07 NOTE — H&P (Signed)
Patient was seen, case reviewed and agree with treatment plan.  

## 2011-06-07 NOTE — Progress Notes (Signed)
Pt started this shift irritable and stating she "was ready to go home" and that she could finish out her detox herself. Had a long 1:1 about early recovery and gave loving support and encouragement.  Rates depression and hopelessness at 1 with a good appetite and poor sleep.  Active group participation and good insight into SA issues. Declined clonidine proptocol medications. Support and encouragement given and 15' checks cont for safety.
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
## 2011-06-07 NOTE — Progress Notes (Signed)
Patient ID: Chelsea May, female   DOB: [birth year], 26 y.o.   MRN: [id number] Pt. attended and participated in aftercare planning group. Pt. accepted information on suicide prevention, warning signs to look for with suicide and crisis line numbers to use. The pt. agreed to call crisis line numbers if having warning signs or having thoughts of suicide. Pt. listed their current anxiety level as low.  Additionally, patient accepted NA meeting schedule.

## 2011-06-07 NOTE — Progress Notes (Signed)
Writer spoke with patient and she informed me that she has had a good day and has not taken any medications and has felt fine. Patient informed Clinical research associate that her friend that visited her today was told by her  that she could no longer be around him if he continued to use because she know that she has to change her routine and the friends that she hangs out with. Patient reports that she knew she had to talk to him today about this and is probably the reason she did not sleep well last night. Patient was informed of her scheduled medications if she changed her mind and she reported that she did not want to take her clonidine. Patient was supported and encouraged, currently denies having pain but requested a heat pack for abdominal cramps from her cycle. -si/hi/a/v hall. Safety maintained on unit will continue to monitor.

## 2011-06-08 DIAGNOSIS — F1110 Opioid abuse, uncomplicated: Secondary | ICD-10-CM

## 2011-06-08 LAB — POCT PREGNANCY, URINE: Preg Test, Ur: NEGATIVE

## 2011-06-08 MED ORDER — HYDROXYZINE HCL 25 MG PO TABS: 25.0000 mg | ORAL_TABLET | Freq: Four times a day (QID) | ORAL | Status: AC | PRN

## 2011-06-08 MED ORDER — ETONOGESTREL 68 MG ~~LOC~~ IMPL: 1.0000 | DRUG_IMPLANT | Freq: Once | SUBCUTANEOUS | Status: DC

## 2011-06-08 MED ORDER — TRAZODONE HCL 50 MG PO TABS: 50.0000 mg | ORAL_TABLET | Freq: Every evening | ORAL | Status: DC | PRN

## 2011-06-08 NOTE — Progress Notes (Signed)
Writer unsuccessful in attempts to contact patient's mother, Chelsea May, today at 10:55AM  and 1:05PM to obtain collateral information as pt states she will be returning to Mother's home.  [name] 06/08/2011 1:08 PM

## 2011-06-08 NOTE — Progress Notes (Signed)
[facility] Group Notes:  (Counselor/Nursing/MHT/Case Management/Adjunct)  06/08/2011 2:03 PM  Type of Therapy:  Group Therapy  Participation Level:  Did Not Attend Patient was in for introductions yet left group room as soon as topic announced  Oliva Bustard 06/08/2011, 2:03 PMBHH Group Notes:  (Counselor/Nursing/MHT/Case Management/Adjunct)  06/08/2011   Type of Therapy:  Nurse Education  Participation Level:  Active  Participation Quality:  Attentive  Affect:  Appropriate  Cognitive:  Alert  Insight:  Good  Engagement in Group:  Good  Engagement in Therapy:  Good  Modes of Intervention:  Education  Summary of Progress/Problems:        She was discharged. Voiced understanding of discharge instruction and of follow up plan.  Denies thoughts of SI. All belonging taken home   Oliva Bustard 06/08/2011, 2:03 PM

## 2011-06-08 NOTE — Progress Notes (Signed)
[facility] Case Management Discharge Plan:  Will you be returning to the same living situation after discharge: Yes,  home with mother At discharge, do you have transportation home?:Yes,  friend Do you have the ability to pay for your medications:Yes,  mental health  Interagency Information:     Release of information consent forms completed and in the chart;  Patient's signature needed at discharge.  Patient to Follow up at:  Follow-up Information    Follow up with [facility] on 06/10/2011. (8 AM)    Contact information:   [address]  [place], [place]  [phone]      Follow up with AA/NA mtgs  [facility]  in [place]  [address]  Most evenings at [employer] .   Contact information:   [address] [place]         Patient denies SI/HI:   Yes,  yes    Aeronautical engineer and Suicide Prevention discussed:  Yes,  yes  Barrier to discharge identified:No.  Summary and Recommendations:   Ida Rogue 06/08/2011, 11:19 AM
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
## 2011-06-08 NOTE — Treatment Plan (Signed)
Interdisciplinary Treatment Plan Update (Adult)  Date: 06/08/2011  Time Reviewed: 10:42 AM   Progress in Treatment: Attending groups: Yes Participating in groups: Yes Taking medication as prescribed: Yes Tolerating medication: Yes   Family/Significant othe contact made:  Yes, counselor Patient understands diagnosis:  Yes  As evidenced by asking for help with opiate addiction Discussing patient identified problems/goals with staff:  Yes  See below Medical problems stabilized or resolved:  Yes Denies suicidal/homicidal ideation: Yes  In tx team Issues/concerns per patient self-inventory: Yes  Headache Other:  New problem(s) identified: N/A  Reason for Continuation of Hospitalization: Other; describe D/C today  Interventions implemented related to continuation of hospitalization:   Additional comments:  Estimated length of stay: D/C today  Discharge Plan: Return home,  Follow up outpt and AA mtgs  New goal(s): N/A  Review of initial/current patient goals per problem list:   1.  Goal(s):Safely detox from Opiates  Met:  Yes  Target date:2/25  As evidenced ZO:[id number] in COWS score from current to 0  2.  Goal (s):Identify comprehensive sobriety plan  Met:  Yes  Target date:2/25  As evidenced UJ:WJXB report  3.  Goal(s):  Met:  Yes  Target date:  As evidenced by:  4.  Goal(s):  Met:  Yes  Target date:  As evidenced by:  Attendee.s: Patient:  Chelsea May 06/08/2011 10:42 AM  Family:     Physician:  Lupe Carney 06/08/2011 10:42 AM   Nursing: Carolynn Comment   06/08/2011 10:42 AM   Case Manager:  Richelle Ito, LCSW 06/08/2011 10:42 AM   Counselor:  Ronda Fairly, LCSWA 06/08/2011 10:42 AM   Other:     Other:     Other:     Other:      Scribe for Treatment Team:   Ida Rogue, 06/08/2011 10:42 AM

## 2011-06-08 NOTE — Progress Notes (Signed)
[facility] Group Notes:  (Counselor/Nursing/MHT/Case Management/Adjunct)  06/08/2011 1:09 PM  Type of Therapy:  Group Therapy  Participation Level:  Did Not Attend Patient was in for introductions yet left group room as soon as topic announced  [name] 06/08/2011, 1:09 PMBHH Group Notes:  (Counselor/Nursing/MHT/Case Management/Adjunct)  06/08/2011   Type of Therapy:  Group Therapy  Participation Level:  Did Not Attend   [name] 06/08/2011, 1:09 PM

## 2011-06-08 NOTE — [facility] Suicide Risk Assessment (Signed)
Suicide Risk Assessment  Discharge Assessment     Demographic Factors: See chart.  Current Mental Status:  Patient seen and evaluated in treatment team. Chart reviewed. Patient stated that her mood was "good". Her affect was mood congruent and euthymic. She denied any current thoughts of self injurious behavior, suicidal ideation or homicidal ideation. There were no auditory or visual hallucinations, paranoia, delusional thought processes, or mania noted.  Thought process was linear and goal directed.  No psychomotor agitation or retardation was noted. Speech was normal rate, tone and volume. Eye contact was good. Judgment and insight are fair.  Patient has been up and engaged on the unit.  No acute safety concerns reported from team.  No sig withdrawal s/s noted at this time.  Pt off detox meds for past 2 days.  Pt not interested in residential Tx at this time.  Discussed outpt options.  Risk Reduction Factors:  Risk Reduction Factors: Responsible for children under 23 years of age;Religious beliefs about death;Living with another person, especially a relative;Positive social support; children; willingness to f/u with AA/NA.  Denied FamHx BPAD.  CLINICAL FACTORS: Opioid Dependence; Cannabis Abuse; W/D resolved  COGNITIVE FEATURES THAT CONTRIBUTE TO RISK: limited insight; impulsivity  SUICIDE RISK: Patient is currently viewed as a low risk of harm to herself and others in light of her history and risk factors. There are no acute safety concerns and she is stable for discharge. Continued sobriety, potential medication management and followup will mitigate against any potential increased risk in the future.   VS:  Filed Vitals:   06/08/11 0601  BP: 134/87  Pulse: 90  Temp:   Resp:     PLAN OF CARE: Pt stable for and requesting discharge. Pt contracting for safety and does not currently meet [facility] involuntary commitment criteria for continued hospitalization.  Mental health treatment and continued  sobriety will mitigate against the increased risk of harm to self and/or others.  Discussed the importance of recovery further with pt, as well as, tools to move forward in a healthy & safe manner.  Pt agreeable with the plan.  Discussed with the team.  Please see orders, follow up plans per team and full discharge summary completed by physician extender.   Lupe Carney 06/08/2011, 2:32 PM

## 2011-06-09 ENCOUNTER — Encounter (HOSPITAL_COMMUNITY): Payer: Self-pay | Admitting: *Deleted

## 2011-06-09 ENCOUNTER — Emergency Department (HOSPITAL_COMMUNITY)
Admission: EM | Admit: 2011-06-09 | Discharge: 2011-06-09 | Disposition: A | Discharge status: Home / Self Care | Payer: Self-pay | Attending: Emergency Medicine | Admitting: Emergency Medicine

## 2011-06-09 DIAGNOSIS — Z79899 Other long term (current) drug therapy: Secondary | ICD-10-CM | POA: Insufficient documentation

## 2011-06-09 DIAGNOSIS — R112 Nausea with vomiting, unspecified: Secondary | ICD-10-CM | POA: Insufficient documentation

## 2011-06-09 DIAGNOSIS — F17200 Nicotine dependence, unspecified, uncomplicated: Secondary | ICD-10-CM | POA: Insufficient documentation

## 2011-06-09 MED ORDER — PROMETHAZINE HCL 25 MG/ML IJ SOLN
12.5000 mg | Freq: Once | INTRAMUSCULAR | Status: AC
  Administered 2011-06-09: 12.5 mg via INTRAVENOUS
  Filled 2011-06-09: qty 1

## 2011-06-09 MED ORDER — ONDANSETRON HCL 4 MG/2ML IJ SOLN
INTRAMUSCULAR | Status: AC
  Administered 2011-06-09: 4 mg
  Filled 2011-06-09: qty 2

## 2011-06-09 MED ORDER — PROMETHAZINE HCL 25 MG RE SUPP: 25.0000 mg | Freq: Four times a day (QID) | RECTAL | Status: DC | PRN

## 2011-06-09 MED ORDER — SODIUM CHLORIDE 0.9 % IV BOLUS (SEPSIS)
1000.0000 mL | Freq: Once | INTRAVENOUS | Status: AC
  Administered 2011-06-09: 1000 mL via INTRAVENOUS

## 2011-06-09 MED ORDER — ONDANSETRON 4 MG PO TBDP
4.0000 mg | ORAL_TABLET | Freq: Once | ORAL | Status: AC
  Administered 2011-06-09: 4 mg via ORAL
  Filled 2011-06-09: qty 1

## 2011-06-09 MED ORDER — PANTOPRAZOLE SODIUM 40 MG IV SOLR
40.0000 mg | Freq: Once | INTRAVENOUS | Status: AC
  Administered 2011-06-09: 40 mg via INTRAVENOUS
  Filled 2011-06-09: qty 40

## 2011-06-09 NOTE — ED Provider Notes (Signed)
History   This chart was scribed for [employer]. Colon Branch, MD by Clarita Crane. The patient was seen in room APA19/APA19. Patient's care was started at 0539.    CSN: [id number]  Arrival date & time 06/09/11  8469   First MD Initiated Contact with Patient 06/09/11 [phone]      Chief Complaint  Patient presents with  . Emesis    (Consider location/radiation/quality/duration/timing/severity/associated sxs/prior treatment) HPI Chelsea May is a 26 y.o. female who presents to the Emergency Department complaining of constant moderate to severe nausea and vomiting onset yesterday while receiving a ride home from behavioral health for detox and persistent since with no associated symptoms. Patient notes that symptoms have improved moderately within the past hour after administration of 4mg  Zofran via IV in ED. States nausea and vomiting was not relieved with use of Zofran PO yesterday. Patient was discharged yesterday after 4 day hospitalization at [facility] for detoxification from opiate abuse. Patient with h/o asthma, bronchitis, CA.  Past Medical History  Diagnosis Date  . Brain damage   . Murmur   . Asthma   . Bronchitis   . Cancer   . Substance abuse     Past Surgical History  Procedure Date  . Back surgery     No family history on file.  History  Substance Use Topics  . Smoking status: Current Everyday Smoker -- 0.5 packs/day  . Smokeless tobacco: Not on file  . Alcohol Use: Yes     occ    OB History    Grav Para Term Preterm Abortions TAB SAB Ect Mult Living                  Review of Systems 10 Systems reviewed and are negative for acute change except as noted in the HPI.  Allergies  Hydrocodone  Home Medications   Current Outpatient Rx  Name Route Sig Dispense Refill  . ETONOGESTREL 68 MG Tat Momoli IMPL Subcutaneous Inject 1 each (68 mg total) into the skin once. Birth control method.  No prescription given !! 1 each 0  . HYDROXYZINE HCL 25 MG PO TABS  Oral Take 1 tablet (25 mg total) by mouth every 6 (six) hours as needed for anxiety. For anxiety 90 tablet 0  . TRAZODONE HCL 50 MG PO TABS Oral Take 1 tablet (50 mg total) by mouth at bedtime as needed for sleep (May repeat x 1). For sleep 30 tablet 0    BP 124/67  Pulse 96  Temp(Src) 97.3 F (36.3 C) (Oral)  Resp 20  Ht 5\' 3"  (1.6 m)  Wt 125 lb (56.7 kg)  BMI 22.14 kg/m2  SpO2 100%  Physical Exam  Nursing note and vitals reviewed. Constitutional: She is oriented to person, place, and time. She appears well-developed and well-nourished. No distress.  HENT:  Head: Normocephalic and atraumatic.  Eyes: EOM are normal. Pupils are equal, round, and reactive to light.  Neck: Neck supple. No tracheal deviation present.  Cardiovascular: Normal rate and regular rhythm.  Exam reveals no gallop and no friction rub.   No murmur heard. Pulmonary/Chest: Effort normal. No respiratory distress. She has no wheezes. She has no rales.  Abdominal: Soft. Bowel sounds are normal. She exhibits no distension. There is no tenderness.  Musculoskeletal: Normal range of motion. She exhibits no edema.  Neurological: She is alert and oriented to person, place, and time. No sensory deficit.  Skin: Skin is warm and dry.  Psychiatric: She has a normal mood  and affect. Her behavior is normal.    ED Course  Procedures (including critical care time)  DIAGNOSTIC STUDIES: Oxygen Saturation is 100% on room air, normal by my interpretation.    COORDINATION OF CARE: 7:19AM- Patient reports that her nausea and vomiting has moderately improved since arrival in ED. Patient was administered 4mg  Zofran via IV.  8:15AM- Patient states she is feeling better at this time following administration of Phenergan-12.5mg . Will d/c home.     MDM  Patient with persistent vomiting after discharge from detox off opioids. Given IVF, antiemetics with relief. Able to take both PO fluids and snacks. Pt feels improved after  observation and/or treatment in ED.Pt stable in ED with no significant deterioration in condition.The patient appears reasonably screened and/or stabilized for discharge and I doubt any other medical condition or other [facility] requiring further screening, evaluation, or treatment in the ED at this time prior to discharge.  I personally performed the services described in this documentation, which was scribed in my presence. The recorded information has been reviewed and considered.  - MDM Reviewed: nursing note, vitals and previous chart Reviewed previous: labs           [facility]. Colon Branch, MD 06/09/11 [phone]

## 2011-06-09 NOTE — ED Notes (Signed)
Pt reports still nauseas.  Notified edp

## 2011-06-09 NOTE — ED Notes (Signed)
C/o vomiting onset 1400 yesterday after being discharged yesterday from [facility] for medical detox. States has been compliant with medications.

## 2011-06-11 NOTE — Progress Notes (Signed)
Patient Discharge Instructions:  Psychiatric Admission Assessment Note Faxed,  06/11/2011 Discharge Summary Note Faxed,   06/11/2011 After Visit Summary (AVS) Faxed,  06/11/2011 Face Sheet Faxed, 06/11/2011 Faxed to the Next Level Care provider:  06/11/2011  Faxed to [facility] @ [phone] No fax available for [facility] sent via certified mail to  [address] [place], [place] [address]  Wandra Scot, 06/11/2011, 11:18 AM

## 2011-06-11 NOTE — Progress Notes (Signed)
Patient Discharge Instructions:  Psychiatric Admission Assessment Note Faxed,  06/10/2011 Discharge Summary Note Faxed,   06/10/2011 After Visit Summary (AVS) Faxed,  06/10/2011 Face Sheet Faxed, 06/10/2011 Faxed to the Next Level Care provider:  06/10/2011  Faxed to [facility] @ [phone]  Heloise Purpura, Eduard Clos, 06/11/2011, 11:24 AM

## 2011-10-11 ENCOUNTER — Emergency Department: Payer: Self-pay | Admitting: Internal Medicine

## 2011-11-09 ENCOUNTER — Encounter (HOSPITAL_COMMUNITY): Payer: Self-pay | Admitting: Emergency Medicine

## 2011-11-09 ENCOUNTER — Emergency Department (HOSPITAL_COMMUNITY)
Admission: EM | Admit: 2011-11-09 | Discharge: 2011-11-10 | Disposition: A | Discharge status: Home / Self Care | Payer: Self-pay | Attending: Emergency Medicine | Admitting: Emergency Medicine

## 2011-11-09 DIAGNOSIS — J45909 Unspecified asthma, uncomplicated: Secondary | ICD-10-CM | POA: Insufficient documentation

## 2011-11-09 DIAGNOSIS — R0682 Tachypnea, not elsewhere classified: Secondary | ICD-10-CM | POA: Insufficient documentation

## 2011-11-09 DIAGNOSIS — F17200 Nicotine dependence, unspecified, uncomplicated: Secondary | ICD-10-CM | POA: Insufficient documentation

## 2011-11-09 DIAGNOSIS — R112 Nausea with vomiting, unspecified: Secondary | ICD-10-CM | POA: Insufficient documentation

## 2011-11-09 LAB — CBC WITH DIFFERENTIAL/PLATELET
Basophils Absolute: 0 K/uL (ref 0.0–0.1)
Basophils Relative: 0 % (ref 0–1)
Eosinophils Absolute: 0.2 K/uL (ref 0.0–0.7)
Eosinophils Relative: 1 % (ref 0–5)
HCT: 41.9 % (ref 36.0–46.0)
Hemoglobin: 14.3 g/dL (ref 12.0–15.0)
Lymphocytes Relative: 8 % — ABNORMAL LOW (ref 12–46)
Lymphs Abs: 1.6 K/uL (ref 0.7–4.0)
MCH: 31.2 pg (ref 26.0–34.0)
MCHC: 34.1 g/dL (ref 30.0–36.0)
MCV: 91.5 fL (ref 78.0–100.0)
Monocytes Absolute: 1.1 K/uL — ABNORMAL HIGH (ref 0.1–1.0)
Monocytes Relative: 5 % (ref 3–12)
Neutro Abs: 17.6 K/uL — ABNORMAL HIGH (ref 1.7–7.7)
Neutrophils Relative %: 86 % — ABNORMAL HIGH (ref 43–77)
Platelets: 244 K/uL (ref 150–400)
RBC: 4.58 MIL/uL (ref 3.87–5.11)
RDW: 12.6 % (ref 11.5–15.5)
WBC: 20.6 K/uL — ABNORMAL HIGH (ref 4.0–10.5)

## 2011-11-09 LAB — URINALYSIS, ROUTINE W REFLEX MICROSCOPIC
Hgb urine dipstick: NEGATIVE
Leukocytes, UA: NEGATIVE
Nitrite: NEGATIVE
Protein, ur: NEGATIVE mg/dL
Specific Gravity, Urine: 1.015 (ref 1.005–1.030)
Urobilinogen, UA: 0.2 mg/dL (ref 0.0–1.0)

## 2011-11-09 LAB — BASIC METABOLIC PANEL
BUN: 8 mg/dL (ref 6–23)
Chloride: 101 mEq/L (ref 96–112)
GFR calc Af Amer: >90 mL/min (ref >90)
Potassium: 3.8 mEq/L (ref 3.5–5.1)
Sodium: 140 mEq/L (ref 135–145)

## 2011-11-09 LAB — PREGNANCY, URINE: Preg Test, Ur: NEGATIVE

## 2011-11-09 LAB — HEPATIC FUNCTION PANEL
ALT: 24 U/L (ref 0–35)
AST: 36 U/L (ref 0–37)
Albumin: 4.6 g/dL (ref 3.5–5.2)
Alkaline Phosphatase: 65 U/L (ref 39–117)
Bilirubin, Direct: <0.1 mg/dL (ref 0.0–0.3)
Total Bilirubin: 0.3 mg/dL (ref 0.3–1.2)
Total Protein: 7.7 g/dL (ref 6.0–8.3)

## 2011-11-09 LAB — LIPASE, BLOOD: Lipase: 19 U/L (ref 11–59)

## 2011-11-09 MED ORDER — DIPHENHYDRAMINE HCL 50 MG/ML IJ SOLN
25.0000 mg | Freq: Once | INTRAMUSCULAR | Status: AC
  Administered 2011-11-09: 50 mg via INTRAVENOUS
  Filled 2011-11-09: qty 1

## 2011-11-09 MED ORDER — SODIUM CHLORIDE 0.9 % IV SOLN: Freq: Once | INTRAVENOUS | Status: DC

## 2011-11-09 MED ORDER — METOCLOPRAMIDE HCL 5 MG/ML IJ SOLN
10.0000 mg | Freq: Once | INTRAMUSCULAR | Status: AC
  Administered 2011-11-09: 10 mg via INTRAVENOUS
  Filled 2011-11-09: qty 2

## 2011-11-09 MED ORDER — SODIUM CHLORIDE 0.9 % IV BOLUS (SEPSIS)
1000.0000 mL | Freq: Once | INTRAVENOUS | Status: AC
  Administered 2011-11-09: 1000 mL via INTRAVENOUS

## 2011-11-09 MED ORDER — METOCLOPRAMIDE HCL 10 MG PO TABS: 10.0000 mg | ORAL_TABLET | Freq: Four times a day (QID) | ORAL | Status: DC | PRN

## 2011-11-09 NOTE — ED Notes (Signed)
Pt states with abd pain and vomiting for several hours, pt in room making noises and spitting in an emesis bag, no active vomiting noted at this time

## 2011-11-09 NOTE — ED Notes (Signed)
Patient complaining of abdominal pain and vomiting starting today at 1200. States "I just have this problem that I start vomiting and I can't stop and I come here and they fix me."

## 2011-11-09 NOTE — ED Provider Notes (Signed)
History     CSN: [id number]  Arrival date & time 11/09/11  2125   First MD Initiated Contact with Patient 11/09/11 2230      Chief Complaint  Patient presents with  . Emesis  . Abdominal Pain    (Consider location/radiation/quality/duration/timing/severity/associated sxs/prior treatment) Patient is a 26 y.o. female presenting with vomiting and abdominal pain. The history is provided by the patient.  Emesis  Associated symptoms include abdominal pain.  Abdominal Pain The primary symptoms of the illness include abdominal pain and vomiting.  She had been in her usual state of health until about noon today when she started having severe nausea and vomiting. She states that she has abdominal soreness and not truly abdominal pain. She is vomited multiple times and still is complaining of nausea. She has chronic constipation which is unchanged. She's had similar episodes in the past and has had to come to the emergency department for treatment but has never had a diagnosis made. She feels like she is dehydrated and she has a headache secondary to being dehydrated. Symptoms are severe. Nothing makes it feel better and nothing makes it worse. She denies fever or chills or sweats. Also, for the last week, she has had difficulty urinating and that she has to bear down to pass in the urine. There is no dysuria and no urinary urgency. She does have a history of narcotic abuse but states that she has not used narcotics in the last 4 months.  Past Medical History  Diagnosis Date  . Brain damage   . Murmur   . Asthma   . Bronchitis   . Cancer   . Substance abuse     Past Surgical History  Procedure Date  . Back surgery     History reviewed. No pertinent family history.  History  Substance Use Topics  . Smoking status: Current Everyday Smoker -- 0.5 packs/day  . Smokeless tobacco: Not on file  . Alcohol Use: Yes     occ    OB History    Grav Para Term Preterm Abortions TAB SAB Ect  Mult Living                  Review of Systems  Gastrointestinal: Positive for vomiting and abdominal pain.  All other systems reviewed and are negative.    Allergies  Hydrocodone  Home Medications   Current Outpatient Rx  Name Route Sig Dispense Refill  . MEGESTROL ACETATE 20 MG PO TABS Oral Take 20 mg by mouth 2 (two) times daily.    . ETONOGESTREL 68 MG [place] IMPL Subcutaneous Inject 1 each (68 mg total) into the skin once. Birth control method.  No prescription given !! 1 each 0    BP 94/76  Pulse 100  Temp 97.4 F (36.3 C) (Oral)  Resp 22  Ht 5\' 2"  (1.575 m)  Wt 120 lb (54.432 kg)  BMI 21.95 kg/m2  SpO2 100%  Physical Exam  Nursing note and vitals reviewed. 26 year old female is resting comfortably and in no acute distress. Vital signs are significant for mild tachypnea with respiratory rate of 22. Oxygen saturation is 100% which is normal. Head is normocephalic and atraumatic. PERRLA, EOMI. There is no scleral icterus. Oropharynx is clear. Neck is nontender and supple. Back is nontender. Lungs are clear without rales, wheezes, rhonchi. Heart has regular rate rhythm without murmur. Abdomen is soft, flat, nontender without masses or hepatosplenomegaly. Peristalsis is decreased. Extremities have full range of motion, no cyanosis or  edema. Skin is warm and dry without rash. Neurologic: Mental status is normal, cranial nerves are intact, there are no motor or sensory deficits.  ED Course  Procedures (including critical care time)  Results for orders placed during the hospital encounter of 11/09/11  LIPASE, BLOOD      Component Value Range   Lipase 19  11 - 59 U/L  CBC WITH DIFFERENTIAL      Component Value Range   WBC 20.6 (*) 4.0 - 10.5 K/uL   RBC 4.58  3.87 - 5.11 MIL/uL   Hemoglobin 14.3  12.0 - 15.0 g/dL   HCT 96.0  45.4 - 09.8 %   MCV 91.5  78.0 - 100.0 fL   MCH 31.2  26.0 - 34.0 pg   MCHC 34.1  30.0 - 36.0 g/dL   RDW 11.9  14.7 - 82.9 %   Platelets 244  150  - 400 K/uL   Neutrophils Relative 86 (*) 43 - 77 %   Neutro Abs 17.6 (*) 1.7 - 7.7 K/uL   Lymphocytes Relative 8 (*) 12 - 46 %   Lymphs Abs 1.6  0.7 - 4.0 K/uL   Monocytes Relative 5  3 - 12 %   Monocytes Absolute 1.1 (*) 0.1 - 1.0 K/uL   Eosinophils Relative 1  0 - 5 %   Eosinophils Absolute 0.2  0.0 - 0.7 K/uL   Basophils Relative 0  0 - 1 %   Basophils Absolute 0.0  0.0 - 0.1 K/uL  BASIC METABOLIC PANEL      Component Value Range   Sodium 140  135 - 145 mEq/L   Potassium 3.8  3.5 - 5.1 mEq/L   Chloride 101  96 - 112 mEq/L   CO2 28  19 - 32 mEq/L   Glucose, Bld 127 (*) 70 - 99 mg/dL   BUN 8  6 - 23 mg/dL   Creatinine, Ser 5.62  0.50 - 1.10 mg/dL   Calcium 9.7  8.4 - 13.0 mg/dL   GFR calc non Af Amer >90  >90 mL/min   GFR calc Af Amer >90  >90 mL/min  PREGNANCY, URINE      Component Value Range   Preg Test, Ur NEGATIVE  NEGATIVE  URINALYSIS, ROUTINE W REFLEX MICROSCOPIC      Component Value Range   Color, Urine [place] (*) YELLOW   APPearance CLEAR  CLEAR   Specific Gravity, Urine 1.015  1.005 - 1.030   pH 6.5  5.0 - 8.0   Glucose, UA NEGATIVE  NEGATIVE mg/dL   Hgb urine dipstick NEGATIVE  NEGATIVE   Bilirubin Urine NEGATIVE  NEGATIVE   Ketones, ur NEGATIVE  NEGATIVE mg/dL   Protein, ur NEGATIVE  NEGATIVE mg/dL   Urobilinogen, UA 0.2  0.0 - 1.0 mg/dL   Nitrite NEGATIVE  NEGATIVE   Leukocytes, UA NEGATIVE  NEGATIVE  HEPATIC FUNCTION PANEL      Component Value Range   Total Protein 7.7  6.0 - 8.3 g/dL   Albumin 4.6  3.5 - 5.2 g/dL   AST 36  0 - 37 U/L   ALT 24  0 - 35 U/L   Alkaline Phosphatase 65  39 - 117 U/L   Total Bilirubin 0.3  0.3 - 1.2 mg/dL   Bilirubin, Direct <8.6  0.0 - 0.3 mg/dL   Indirect Bilirubin NOT CALCULATED  0.3 - 0.9 mg/dL    1. Nausea and vomiting       MDM  Episodic vomiting. This may  be cyclic vomiting syndrome. Urinary difficulty which may represent a urinary tract infection. She'll be given IV fluids and IV antiemetics and  reassessed.  WBC has come back very high. Prior records are reviewed and she has had several ED visits for similar complaints and has had significant leukocytosis the past. Her abdomen is benign at this time and she does not appear toxic and I both feel that the elevated WBC is stress related.  Following IV hydration and IV metoclopramide, and she is sleeping comfortably. She'll be discharged with a prescription for metoclopramide.      Dione Booze, MD 11/09/11 2350

## 2011-12-01 ENCOUNTER — Emergency Department (HOSPITAL_COMMUNITY)
Admission: EM | Admit: 2011-12-01 | Discharge: 2011-12-01 | Disposition: A | Discharge status: Home / Self Care | Payer: Self-pay | Attending: Emergency Medicine | Admitting: Emergency Medicine

## 2011-12-01 ENCOUNTER — Emergency Department (HOSPITAL_COMMUNITY): Payer: Self-pay

## 2011-12-01 ENCOUNTER — Encounter (HOSPITAL_COMMUNITY): Payer: Self-pay | Admitting: *Deleted

## 2011-12-01 DIAGNOSIS — R5383 Other fatigue: Secondary | ICD-10-CM | POA: Insufficient documentation

## 2011-12-01 DIAGNOSIS — R109 Unspecified abdominal pain: Secondary | ICD-10-CM | POA: Insufficient documentation

## 2011-12-01 DIAGNOSIS — R5381 Other malaise: Secondary | ICD-10-CM | POA: Insufficient documentation

## 2011-12-01 DIAGNOSIS — R112 Nausea with vomiting, unspecified: Secondary | ICD-10-CM | POA: Insufficient documentation

## 2011-12-01 HISTORY — DX: Cardiac murmur, unspecified: R01.1

## 2011-12-01 HISTORY — DX: Personal history of other specified conditions: Z87.898

## 2011-12-01 LAB — COMPREHENSIVE METABOLIC PANEL
AST: 28 U/L (ref 0–37)
CO2: 26 mEq/L (ref 19–32)
Calcium: 10.5 mg/dL (ref 8.4–10.5)
Creatinine, Ser: 0.79 mg/dL (ref 0.50–1.10)
GFR calc non Af Amer: >90 mL/min (ref >90)

## 2011-12-01 LAB — CBC WITH DIFFERENTIAL/PLATELET
Basophils Relative: 0 % (ref 0–1)
Eosinophils Absolute: 0.3 10*3/uL (ref 0.0–0.7)
MCH: 32.4 pg (ref 26.0–34.0)
MCHC: 35.1 g/dL (ref 30.0–36.0)
Monocytes Absolute: 1.6 10*3/uL — ABNORMAL HIGH (ref 0.1–1.0)
Neutrophils Relative %: 80 % — ABNORMAL HIGH (ref 43–77)
Platelets: 285 10*3/uL (ref 150–400)
RDW: 13.1 % (ref 11.5–15.5)

## 2011-12-01 LAB — URINALYSIS, ROUTINE W REFLEX MICROSCOPIC
Hgb urine dipstick: NEGATIVE
Leukocytes, UA: NEGATIVE
Nitrite: NEGATIVE
Protein, ur: NEGATIVE mg/dL
Specific Gravity, Urine: 1.02 (ref 1.005–1.030)
Urobilinogen, UA: 0.2 mg/dL (ref 0.0–1.0)

## 2011-12-01 LAB — LIPASE, BLOOD: Lipase: 16 U/L (ref 11–59)

## 2011-12-01 LAB — PREGNANCY, URINE: Preg Test, Ur: NEGATIVE

## 2011-12-01 MED ORDER — SODIUM CHLORIDE 0.9 % IV BOLUS (SEPSIS)
1000.0000 mL | Freq: Once | INTRAVENOUS | Status: AC
  Administered 2011-12-01: 1000 mL via INTRAVENOUS

## 2011-12-01 MED ORDER — SODIUM CHLORIDE 0.9 % IV BOLUS (SEPSIS)
500.0000 mL | Freq: Once | INTRAVENOUS | Status: AC
  Administered 2011-12-01: 500 mL via INTRAVENOUS

## 2011-12-01 MED ORDER — FAMOTIDINE IN NACL 20-0.9 MG/50ML-% IV SOLN
20.0000 mg | Freq: Once | INTRAVENOUS | Status: AC
  Administered 2011-12-01: 20 mg via INTRAVENOUS
  Filled 2011-12-01: qty 50

## 2011-12-01 MED ORDER — PROMETHAZINE HCL 25 MG RE SUPP: 25.0000 mg | Freq: Four times a day (QID) | RECTAL | Status: DC | PRN

## 2011-12-01 MED ORDER — PROMETHAZINE HCL 25 MG/ML IJ SOLN
12.5000 mg | Freq: Once | INTRAMUSCULAR | Status: AC
  Administered 2011-12-01: 12.5 mg via INTRAVENOUS
  Filled 2011-12-01: qty 1

## 2011-12-01 MED ORDER — SODIUM CHLORIDE 0.9 % IV SOLN
INTRAVENOUS | Status: DC
  Administered 2011-12-01: 16:00:00 via INTRAVENOUS

## 2011-12-01 NOTE — ED Notes (Signed)
Pt assisted to bathroom, states still cannot "pee".  Pt states every-time she"  is here they give me 2 bags of fluid and more medicine before I have to pee".  Continue to wait on urine sample before pt can go to radiology.

## 2011-12-01 NOTE — ED Provider Notes (Signed)
History     CSN: [id number]  Arrival date & time 12/01/11  1417   First MD Initiated Contact with Patient 12/01/11 1420      Chief Complaint  Patient presents with  . Emesis    HPI Pt was seen at 1430.  Per pt, c/o gradual onset and persistence of multiple intermittent episodes of N/V since last night.  Pt states she has a "long" history of same symptoms, as not been eval by PMD or GI, and does not have a clear diagnosis.  States she "just comes to the ED" when she has symptoms.  Denies abd pain, no diarrhea, no fevers, no back pain, no CP/SOB, no black or blood in stools or emesis.      Past Medical History  Diagnosis Date  . Substance abuse     opiates, detox 05/2011  . Heart murmur   . Hx of nausea and vomiting     Past Surgical History  Procedure Date  . Back surgery     "tumor" removed from her back    History  Substance Use Topics  . Smoking status: Current Everyday Smoker -- 0.5 packs/day  . Smokeless tobacco: Not on file  . Alcohol Use: Yes     occ    Review of Systems ROS: Statement: All systems negative except as marked or noted in the HPI; Constitutional: Negative for fever and chills. ; ; Eyes: Negative for eye pain, redness and discharge. ; ; ENMT: Negative for ear pain, hoarseness, nasal congestion, sinus pressure and sore throat. ; ; Cardiovascular: Negative for chest pain, palpitations, diaphoresis, dyspnea and peripheral edema. ; ; Respiratory: Negative for cough, wheezing and stridor. ; ; Gastrointestinal: +N/V. Negative for diarrhea, abdominal pain, blood in stool, hematemesis, jaundice and rectal bleeding.;; Genitourinary: Negative for dysuria, flank pain and hematuria. ; ; Musculoskeletal: Negative for back pain and neck pain. Negative for swelling and trauma.; ; Skin: Negative for pruritus, rash, abrasions, blisters, bruising and skin lesion.; ; Neuro: Negative for headache, lightheadedness and neck stiffness. Negative for weakness, altered level of  consciousness , altered mental status, extremity weakness, paresthesias, involuntary movement, seizure and syncope.     Allergies  Hydrocodone  Home Medications   Current Outpatient Rx  Name Route Sig Dispense Refill  . ETONOGESTREL 68 MG [place] IMPL Subcutaneous Inject 1 each (68 mg total) into the skin once. Birth control method.  No prescription given !! 1 each 0  . MEGESTROL ACETATE 20 MG PO TABS Oral Take 20 mg by mouth 2 (two) times daily.    Marland Kitchen METOCLOPRAMIDE HCL 10 MG PO TABS Oral Take 1 tablet (10 mg total) by mouth every 6 (six) hours as needed (nausea). 30 tablet 0    BP 108/90  Pulse 101  Temp 97.8 F (36.6 C) (Oral)  Resp 20  SpO2 100%  Physical Exam 1435: Physical examination:  Nursing notes reviewed; Vital signs and O2 SAT reviewed;  Constitutional: Well developed, Well nourished, Well hydrated, +loudly retching on arrival to ED; Head:  Normocephalic, atraumatic; Eyes: EOMI, PERRL, No scleral icterus; ENMT: Mouth and pharynx normal, Mucous membranes moist; Neck: Supple, Full range of motion, No lymphadenopathy; Cardiovascular: Regular rate and rhythm, No murmur, rub, or gallop; Respiratory: Breath sounds clear & equal bilaterally, No rales, rhonchi, wheezes.  Speaking full sentences with ease, Normal respiratory effort/excursion; Chest: Nontender, Movement normal; Abdomen: +retching loudly and spitting into emesis bag. Soft, Nontender, Nondistended, Normal bowel sounds; Genitourinary: No CVA tenderness; Extremities: Pulses normal, No tenderness,  No edema, No calf edema or asymmetry.; Neuro: AA&Ox3, Major CN grossly intact.  Speech clear. No gross focal motor or sensory deficits in extremities.; Skin: Color normal, Warm, Dry.   ED Course  Procedures   MDM  MDM Reviewed: nursing note, vitals and previous chart Reviewed previous: labs Interpretation: labs and x-ray     Results for orders placed during the hospital encounter of 12/01/11  CBC WITH DIFFERENTIAL       Component Value Range   WBC 26.0 (*) 4.0 - 10.5 K/uL   RBC 5.03  3.87 - 5.11 MIL/uL   Hemoglobin 16.3 (*) 12.0 - 15.0 g/dL   HCT 95.2 (*) 84.1 - 32.4 %   MCV 92.2  78.0 - 100.0 fL   MCH 32.4  26.0 - 34.0 pg   MCHC 35.1  30.0 - 36.0 g/dL   RDW 40.1  02.7 - 25.3 %   Platelets 285  150 - 400 K/uL   Neutrophils Relative 80 (*) 43 - 77 %   Lymphocytes Relative 13  12 - 46 %   Monocytes Relative 6  3 - 12 %   Eosinophils Relative 1  0 - 5 %   Basophils Relative 0  0 - 1 %   Neutro Abs 20.7 (*) 1.7 - 7.7 K/uL   Lymphs Abs 3.4  0.7 - 4.0 K/uL   Monocytes Absolute 1.6 (*) 0.1 - 1.0 K/uL   Eosinophils Absolute 0.3  0.0 - 0.7 K/uL   Basophils Absolute 0.0  0.0 - 0.1 K/uL   WBC Morphology WHITE COUNT CONFIRMED ON SMEAR     Smear Review LARGE PLATELETS PRESENT    COMPREHENSIVE METABOLIC PANEL      Component Value Range   Sodium 138  135 - 145 mEq/L   Potassium 3.7  3.5 - 5.1 mEq/L   Chloride 100  96 - 112 mEq/L   CO2 26  19 - 32 mEq/L   Glucose, Bld 153 (*) 70 - 99 mg/dL   BUN 9  6 - 23 mg/dL   Creatinine, Ser 6.64  0.50 - 1.10 mg/dL   Calcium 40.3  8.4 - 47.4 mg/dL   Total Protein 8.0  6.0 - 8.3 g/dL   Albumin 4.6  3.5 - 5.2 g/dL   AST 28  0 - 37 U/L   ALT 25  0 - 35 U/L   Alkaline Phosphatase 79  39 - 117 U/L   Total Bilirubin 0.3  0.3 - 1.2 mg/dL   GFR calc non Af Amer >90  >90 mL/min   GFR calc Af Amer >90  >90 mL/min  LIPASE, BLOOD      Component Value Range   Lipase 16  11 - 59 U/L  URINALYSIS, ROUTINE W REFLEX MICROSCOPIC      Component Value Range   Color, Urine [place] (*) YELLOW   APPearance CLOUDY (*) CLEAR   Specific Gravity, Urine 1.020  1.005 - 1.030   pH 6.5  5.0 - 8.0   Glucose, UA NEGATIVE  NEGATIVE mg/dL   Hgb urine dipstick NEGATIVE  NEGATIVE   Bilirubin Urine NEGATIVE  NEGATIVE   Ketones, ur NEGATIVE  NEGATIVE mg/dL   Protein, ur NEGATIVE  NEGATIVE mg/dL   Urobilinogen, UA 0.2  0.0 - 1.0 mg/dL   Nitrite NEGATIVE  NEGATIVE   Leukocytes, UA NEGATIVE   NEGATIVE  PREGNANCY, URINE      Component Value Range   Preg Test, Ur NEGATIVE  NEGATIVE    Dg Abd Acute  W/chest 12/01/2011  *RADIOLOGY REPORT*  Clinical Data: 26 year old female with abdominal pain nausea vomiting diarrhea weakness.  ACUTE ABDOMEN SERIES (ABDOMEN 2 VIEW & CHEST 1 VIEW)  Comparison: 09/30/2008.  Findings: Tiny probable calcified granuloma in the right mid lung is stable.  Otherwise, the lungs remain clear.  Cardiac size and mediastinal contours are within normal limits.  No pneumothorax or pneumoperitoneum.  Fluid level of the hepatic flexure compatible with diarrhea. Retained stool throughout the colon elsewhere.  No dilated loops. Nonobstructed bowel gas pattern. No acute osseous abnormality identified.  IMPRESSION: Nonobstructed bowel gas pattern, no free air. No acute cardiopulmonary abnormality.   Original Report Authenticated By: Harley Hallmark, M.D.      1815:  Pt states she feels "better" now after sleeping after being given phenergan IV.  WBC elevated, but pt was actively, continually, and forcefully retching on ED arrival and until IV phenergan given.  Likely demargination, as pt has had previous ED visits for same complaints also with leukocytosis.  VS remain stable, abd remains benign. Doubt need for further imaging at this time. Pt has ambulated with steady gait, easy resps.  Pt has tol PO well without N/V.  States she is ready to go home now.  Pt encouraged to f/u with GI MD. Dx testing d/w pt.  Questions answered.  Verb understanding, agreeable to d/c home with outpt f/u.       Laray Anger, DO 12/03/11 1810

## 2011-12-01 NOTE — ED Notes (Signed)
Pt tolerated po fluid and crackers. Pt states is feeling much better.

## 2011-12-01 NOTE — ED Notes (Signed)
Pt given a sprite 

## 2011-12-01 NOTE — ED Notes (Signed)
Pt presents with hyperemesis, with constant moaning, retching with periodic emesis noted of a green/yellow fluid. Pt states has been sick since last night with worsening vomiting today. Pt is diaphoretic, cool to touch and pallor in color. Pt denies fever and diarrhea at this time. No deformities of abdomin noted at this time.

## 2011-12-01 NOTE — ED Notes (Signed)
Pt up to bathroom without assistance. Pt ambulated with steady gait.

## 2011-12-01 NOTE — ED Notes (Signed)
Pt states can not void at this time 

## 2011-12-01 NOTE — ED Notes (Signed)
Constant gagging , moaning, Says vomiting since last pm. Abd pain

## 2011-12-15 ENCOUNTER — Emergency Department: Payer: Self-pay | Admitting: Emergency Medicine

## 2011-12-15 LAB — URINALYSIS, COMPLETE
Blood: NEGATIVE
Glucose,UR: NEGATIVE mg/dL (ref 0–75)
Leukocyte Esterase: NEGATIVE
Nitrite: NEGATIVE
Ph: 8 (ref 4.5–8.0)
RBC,UR: <1 /HPF (ref 0–5)
WBC UR: 1 /HPF (ref 0–5)

## 2011-12-15 LAB — CBC
HCT: 46.2 % (ref 35.0–47.0)
MCH: 31.5 pg (ref 26.0–34.0)
MCHC: 34.3 g/dL (ref 32.0–36.0)
Platelet: 280 10*3/uL (ref 150–440)

## 2011-12-15 LAB — COMPREHENSIVE METABOLIC PANEL
Alkaline Phosphatase: 75 U/L (ref 50–136)
Calcium, Total: 10.3 mg/dL — ABNORMAL HIGH (ref 8.5–10.1)
Chloride: 106 mmol/L (ref 98–107)
Co2: 25 mmol/L (ref 21–32)
Creatinine: 0.93 mg/dL (ref 0.60–1.30)
Osmolality: 276 (ref 275–301)
Potassium: 3.9 mmol/L (ref 3.5–5.1)
SGPT (ALT): 28 U/L (ref 12–78)
Sodium: 138 mmol/L (ref 136–145)

## 2011-12-15 LAB — LIPASE, BLOOD: Lipase: 151 U/L (ref 73–393)

## 2011-12-17 ENCOUNTER — Emergency Department: Payer: Self-pay | Admitting: Emergency Medicine

## 2011-12-17 LAB — COMPREHENSIVE METABOLIC PANEL
Alkaline Phosphatase: 63 U/L (ref 50–136)
Anion Gap: 7 (ref 7–16)
BUN: 17 mg/dL (ref 7–18)
Bilirubin,Total: 0.7 mg/dL (ref 0.2–1.0)
Calcium, Total: 9.3 mg/dL (ref 8.5–10.1)
Chloride: 107 mmol/L (ref 98–107)
Co2: 26 mmol/L (ref 21–32)
EGFR (African American): >60
EGFR (Non-African Amer.): >60
Glucose: 107 mg/dL — ABNORMAL HIGH (ref 65–99)
Osmolality: 281 (ref 275–301)
Potassium: 3.1 mmol/L — ABNORMAL LOW (ref 3.5–5.1)
SGOT(AST): 25 U/L (ref 15–37)
Sodium: 140 mmol/L (ref 136–145)

## 2011-12-17 LAB — DRUG SCREEN, URINE
Amphetamines, Ur Screen: NEGATIVE (ref ?–1000)
Barbiturates, Ur Screen: NEGATIVE (ref ?–200)
Benzodiazepine, Ur Scrn: NEGATIVE (ref ?–200)
Cannabinoid 50 Ng, Ur ~~LOC~~: POSITIVE (ref ?–50)
MDMA (Ecstasy)Ur Screen: NEGATIVE (ref ?–500)
Methadone, Ur Screen: NEGATIVE (ref ?–300)
Opiate, Ur Screen: POSITIVE (ref ?–300)
Phencyclidine (PCP) Ur S: NEGATIVE (ref ?–25)

## 2011-12-17 LAB — CBC WITH DIFFERENTIAL/PLATELET
Eosinophil #: 0.1 10*3/uL (ref 0.0–0.7)
Eosinophil %: 0.6 %
Lymphocyte #: 2 10*3/uL (ref 1.0–3.6)
MCH: 31.2 pg (ref 26.0–34.0)
MCHC: 34.2 g/dL (ref 32.0–36.0)
MCV: 91 fL (ref 80–100)
Monocyte #: 0.4 x10 3/mm (ref 0.2–0.9)
Neutrophil #: 6.5 10*3/uL (ref 1.4–6.5)
Platelet: 234 10*3/uL (ref 150–440)
RBC: 4.22 10*6/uL (ref 3.80–5.20)
RDW: 12.7 % (ref 11.5–14.5)

## 2011-12-17 LAB — URINALYSIS, COMPLETE
Bilirubin,UR: NEGATIVE
Glucose,UR: NEGATIVE mg/dL (ref 0–75)
Leukocyte Esterase: NEGATIVE
Nitrite: NEGATIVE
Ph: 9 (ref 4.5–8.0)
Protein: NEGATIVE
RBC,UR: 1 /HPF (ref 0–5)

## 2011-12-17 LAB — LIPASE, BLOOD: Lipase: 145 U/L (ref 73–393)

## 2011-12-17 LAB — PREGNANCY, URINE: Pregnancy Test, Urine: NEGATIVE m[IU]/mL

## 2012-04-29 ENCOUNTER — Emergency Department (HOSPITAL_COMMUNITY)
Admission: EM | Admit: 2012-04-29 | Discharge: 2012-04-29 | Disposition: A | Discharge status: Home / Self Care | Payer: Self-pay | Attending: Emergency Medicine | Admitting: Emergency Medicine

## 2012-04-29 ENCOUNTER — Encounter (HOSPITAL_COMMUNITY): Payer: Self-pay | Admitting: *Deleted

## 2012-04-29 DIAGNOSIS — R61 Generalized hyperhidrosis: Secondary | ICD-10-CM | POA: Insufficient documentation

## 2012-04-29 DIAGNOSIS — Z79899 Other long term (current) drug therapy: Secondary | ICD-10-CM | POA: Insufficient documentation

## 2012-04-29 DIAGNOSIS — M545 Low back pain, unspecified: Secondary | ICD-10-CM | POA: Insufficient documentation

## 2012-04-29 DIAGNOSIS — R197 Diarrhea, unspecified: Secondary | ICD-10-CM | POA: Insufficient documentation

## 2012-04-29 DIAGNOSIS — M25559 Pain in unspecified hip: Secondary | ICD-10-CM | POA: Insufficient documentation

## 2012-04-29 DIAGNOSIS — Z8719 Personal history of other diseases of the digestive system: Secondary | ICD-10-CM | POA: Insufficient documentation

## 2012-04-29 DIAGNOSIS — Z3202 Encounter for pregnancy test, result negative: Secondary | ICD-10-CM | POA: Insufficient documentation

## 2012-04-29 DIAGNOSIS — F17200 Nicotine dependence, unspecified, uncomplicated: Secondary | ICD-10-CM | POA: Insufficient documentation

## 2012-04-29 DIAGNOSIS — N390 Urinary tract infection, site not specified: Secondary | ICD-10-CM | POA: Insufficient documentation

## 2012-04-29 DIAGNOSIS — R509 Fever, unspecified: Secondary | ICD-10-CM | POA: Insufficient documentation

## 2012-04-29 DIAGNOSIS — R110 Nausea: Secondary | ICD-10-CM | POA: Insufficient documentation

## 2012-04-29 DIAGNOSIS — R011 Cardiac murmur, unspecified: Secondary | ICD-10-CM | POA: Insufficient documentation

## 2012-04-29 DIAGNOSIS — F1111 Opioid abuse, in remission: Secondary | ICD-10-CM | POA: Insufficient documentation

## 2012-04-29 LAB — URINALYSIS, ROUTINE W REFLEX MICROSCOPIC
Bilirubin Urine: NEGATIVE
Glucose, UA: NEGATIVE mg/dL
Hgb urine dipstick: NEGATIVE
Specific Gravity, Urine: 1.01 (ref 1.005–1.030)

## 2012-04-29 LAB — COMPREHENSIVE METABOLIC PANEL
ALT: 18 U/L (ref 0–35)
AST: 29 U/L (ref 0–37)
Albumin: 3.9 g/dL (ref 3.5–5.2)
CO2: 23 mEq/L (ref 19–32)
Chloride: 100 mEq/L (ref 96–112)
Creatinine, Ser: 0.78 mg/dL (ref 0.50–1.10)
Sodium: 134 mEq/L — ABNORMAL LOW (ref 135–145)
Total Bilirubin: 0.3 mg/dL (ref 0.3–1.2)

## 2012-04-29 LAB — CBC WITH DIFFERENTIAL/PLATELET
Basophils Absolute: 0 10*3/uL (ref 0.0–0.1)
Basophils Relative: 0 % (ref 0–1)
HCT: 36.3 % (ref 36.0–46.0)
Lymphocytes Relative: 11 % — ABNORMAL LOW (ref 12–46)
MCHC: 34.2 g/dL (ref 30.0–36.0)
Monocytes Absolute: 0.5 10*3/uL (ref 0.1–1.0)
Neutro Abs: 6.2 10*3/uL (ref 1.7–7.7)
Neutrophils Relative %: 82 % — ABNORMAL HIGH (ref 43–77)
Platelets: 161 10*3/uL (ref 150–400)
RDW: 13.9 % (ref 11.5–15.5)
WBC: 7.6 10*3/uL (ref 4.0–10.5)

## 2012-04-29 LAB — PREGNANCY, URINE: Preg Test, Ur: NEGATIVE

## 2012-04-29 MED ORDER — SODIUM CHLORIDE 0.9 % IV BOLUS (SEPSIS)
1000.0000 mL | Freq: Once | INTRAVENOUS | Status: AC
  Administered 2012-04-29: 1000 mL via INTRAVENOUS

## 2012-04-29 MED ORDER — DEXTROSE 5 % IV SOLN
1.0000 g | Freq: Once | INTRAVENOUS | Status: AC
  Filled 2012-04-29: qty 10

## 2012-04-29 MED ORDER — OXYCODONE-ACETAMINOPHEN 5-325 MG PO TABS
2.0000 | ORAL_TABLET | Freq: Once | ORAL | Status: AC
  Administered 2012-04-29: 2 via ORAL
  Filled 2012-04-29: qty 2

## 2012-04-29 MED ORDER — ONDANSETRON HCL 4 MG/2ML IJ SOLN
4.0000 mg | Freq: Once | INTRAMUSCULAR | Status: AC
Start: 2012-04-29 — End: 2012-04-29
  Administered 2012-04-29: 4 mg via INTRAVENOUS
  Filled 2012-04-29: qty 2

## 2012-04-29 MED ORDER — LORAZEPAM 2 MG/ML IJ SOLN
1.0000 mg | Freq: Once | INTRAMUSCULAR | Status: AC
  Administered 2012-04-29: 1 mg via INTRAVENOUS
  Filled 2012-04-29: qty 1

## 2012-04-29 MED ORDER — OXYCODONE HCL 5 MG PO TABS: 5.0000 mg | ORAL_TABLET | Freq: Four times a day (QID) | ORAL | Status: DC | PRN

## 2012-04-29 MED ORDER — MORPHINE SULFATE 4 MG/ML IJ SOLN
6.0000 mg | Freq: Once | INTRAMUSCULAR | Status: AC
  Administered 2012-04-29: 6 mg via INTRAVENOUS
  Filled 2012-04-29: qty 2

## 2012-04-29 MED ORDER — DEXTROSE 5 % IV SOLN
INTRAVENOUS | Status: AC
  Administered 2012-04-29: 22:00:00
  Filled 2012-04-29: qty 10

## 2012-04-29 MED ORDER — CEPHALEXIN 500 MG PO CAPS: 500.0000 mg | ORAL_CAPSULE | Freq: Four times a day (QID) | ORAL | Status: DC

## 2012-04-29 MED ORDER — OXYCODONE-ACETAMINOPHEN 5-325 MG PO TABS: 1.0000 | ORAL_TABLET | Freq: Four times a day (QID) | ORAL | Status: DC | PRN

## 2012-04-29 MED ORDER — HYDROMORPHONE HCL PF 1 MG/ML IJ SOLN
1.0000 mg | Freq: Once | INTRAMUSCULAR | Status: AC
  Administered 2012-04-29: 1 mg via INTRAVENOUS
  Filled 2012-04-29: qty 1

## 2012-04-29 NOTE — ED Provider Notes (Signed)
History  This chart was scribed for Benny Lennert, MD by Erskine Emery, ED Scribe. This patient was seen in room [facility]01/[facility]01 and the patient's care was started at 20:00.   CSN: [id number]  Arrival date & time 04/29/12  1952   First MD Initiated Contact with Patient 04/29/12 2000      Chief Complaint  Patient presents with  . Dysuria    (Consider location/radiation/quality/duration/timing/severity/associated sxs/prior Treatment) Chelsea May is a 27 y.o. female who presents to the Emergency Department complaining of dysuria and nausea for about a week and diarrhea, fever, low back pain, diaphoresis, and bilateral hip pain since last night. She had a fever of 104.2 at 3 am this morning but got it down to 101 by this afternoon, and now 99.7. Pt reports she recently saw her PCP who says she probably has a kidney infection. She reports she has been taking azo which doesn't seem to relieve her symptoms. Pt has no menstrual period because she has a birth control implant in her arm. Patient is a 27 y.o. female presenting with dysuria. The history is provided by the patient. No language interpreter was used.  Dysuria  This is a new problem. The current episode started yesterday. The problem occurs every urination. The problem has not changed since onset.The quality of the pain is described as burning. The pain is moderate. The maximum temperature recorded prior to her arrival was 103 to 104 F. She is sexually active. There is no history of pyelonephritis. Associated symptoms include sweats, nausea and flank pain. Pertinent negatives include no vomiting, no frequency, no hematuria and no possible pregnancy. Treatments tried: azo. Her past medical history does not include single kidney or catheterization.    Past Medical History  Diagnosis Date  . Substance abuse     opiates, detox 05/2011  . Heart murmur   . Hx of nausea and vomiting     Past Surgical History  Procedure Date  . Back  surgery     "tumor" removed from her back    History reviewed. No pertinent family history.  History  Substance Use Topics  . Smoking status: Current Every Day Smoker -- 0.5 packs/day    Types: Cigarettes  . Smokeless tobacco: Not on file  . Alcohol Use: No     Comment: occ    OB History    Grav Para Term Preterm Abortions TAB SAB Ect Mult Living                  Review of Systems  Constitutional: Positive for fever and diaphoresis. Negative for fatigue.  HENT: Negative for congestion, sinus pressure and ear discharge.   Eyes: Negative for discharge.  Respiratory: Negative for cough.   Cardiovascular: Negative for chest pain.  Gastrointestinal: Positive for nausea and diarrhea. Negative for vomiting and abdominal pain.  Genitourinary: Positive for dysuria and flank pain. Negative for frequency and hematuria.  Musculoskeletal: Positive for back pain.  Skin: Negative for rash.  Neurological: Negative for seizures and headaches.  Hematological: Negative.   Psychiatric/Behavioral: Negative for hallucinations.  All other systems reviewed and are negative.    Allergies  Hydrocodone  Home Medications   Current Outpatient Rx  Name  Route  Sig  Dispense  Refill  . ETONOGESTREL 68 MG [place] IMPL   Subcutaneous   Inject 1 each (68 mg total) into the skin once. Birth control method.  No prescription given !!   1 each   0   .  MEGESTROL ACETATE 20 MG PO TABS   Oral   Take 20 mg by mouth 2 (two) times daily.         Marland Kitchen PROMETHAZINE HCL 25 MG RE SUPP   Rectal   Place 1 suppository (25 mg total) rectally every 6 (six) hours as needed for nausea.   12 each   0   . PROMETHAZINE HCL 25 MG RE SUPP   Rectal   Place 1 suppository (25 mg total) rectally every 6 (six) hours as needed for nausea.   10 each   0     Triage Vitals: BP 100/68  Pulse 135  Temp 99.7 F (37.6 C) (Oral)  Resp 20  Ht 5\' 2"  (1.575 m)  Wt 120 lb (54.432 kg)  BMI 21.95 kg/m2  SpO2  100%  Physical Exam  Nursing note and vitals reviewed. Constitutional: She is oriented to person, place, and time. She appears well-developed.  HENT:  Head: Normocephalic and atraumatic.  Eyes: Conjunctivae normal and EOM are normal. No scleral icterus.  Neck: Neck supple. No thyromegaly present.  Cardiovascular: Normal rate and regular rhythm.  Exam reveals no gallop and no friction rub.   No murmur heard. Pulmonary/Chest: No stridor. She has no wheezes. She has no rales. She exhibits no tenderness.  Abdominal: Soft. She exhibits no distension. There is tenderness. There is no rebound.       RLQ tenderness  Musculoskeletal: Normal range of motion. She exhibits tenderness. She exhibits no edema.       Tender in the right flank  Lymphadenopathy:    She has no cervical adenopathy.  Neurological: She is oriented to person, place, and time. Coordination normal.  Skin: No rash noted. No erythema.  Psychiatric: She has a normal mood and affect. Her behavior is normal.    ED Course  Procedures (including critical care time) DIAGNOSTIC STUDIES: Oxygen Saturation is 100% on room air, normal by my interpretation.    COORDINATION OF CARE: 20:05--I evaluated the patient and we discussed a treatment plan including medication, urinalysis, and blood work to which the pt agreed.   22:04--I rechecked the pt and notified her that she has a kidney infection for which I am putting her on antibiotics. Pt reports a right upper tooth abscess, which I inspected. I told the pt that she needs to see a dentist. She requests a name of a dentist.    Labs Reviewed  CBC WITH DIFFERENTIAL  COMPREHENSIVE METABOLIC PANEL  URINALYSIS, ROUTINE W REFLEX MICROSCOPIC   No results found.   No diagnosis found.    MDM  The chart was scribed for me under my direct supervision.  I personally performed the history, physical, and medical decision making and all procedures in the evaluation of this  patient.Benny Lennert, MD 04/29/12 2218

## 2012-04-29 NOTE — ED Notes (Signed)
Hurting all over, dysuria, nausea, no vomiting , Has had diarrhea..  Fever 104 last night.

## 2012-04-29 NOTE — Discharge Instructions (Signed)
Follow up next week with a family md.  Follow up with a dentist.         RESOURCE GUIDE  Chronic Pain Problems: Contact [facility]  [phone] Patients need to be referred by their primary care doctor.  Insufficient Money for Medicine: Contact United Way:  call "211."   No Primary Care Doctor: - Call Health Connect  [phone] - can help you locate a primary care doctor that  accepts your insurance, provides certain services, etc. - Physician Referral Service- [phone]  Agencies that provide inexpensive medical care: - [facility]  [phone] - [facility]  [phone] - [facility]  [phone] - Women's Clinic  [phone] - Planned Parenthood  [phone] - [facility]  [phone]  Medicaid-accepting [facility] Providers: - [facility]- [address]  [phone], Mon-Fri 9am-7pm, Sat 9am-1pm - [facility]- [address] [place], Suite [place]  [phone] - [facility]- [address], Suite [place]  [phone] [facility]- [address]  [phone] - Renaye Rakers- [address] [place], [address], [phone]  Only accepts [place] patients after they have their name  applied to their card  Self Pay (no insurance) in [place]: - Sickle Cell Patients: Dr Willey Blade, [facility]  [address] [place], [phone] - [facility]- [address] [place]  [phone]       [facility]- [address]       -     [facility]- see information above (Speak to [employer] if you do not have insurance)       -  [facility]- [address],  [phone]       -  [facility]- [address] [phone]       -  Dr Julio Sicks-  [address], [place] [employer], [phone]       -  [facility] - [address],  [phone]       -  [facility]- [address] [phone], also [address] [phone]       -    [facility]- [address] [place], [phone], 1st & 3rd Saturday        every month, 10am-1pm  1) Find a Doctor and Pay Out of Pocket Although you won't have to find out who is covered by your insurance plan, it is a good idea to ask around and get recommendations. You will then need to call the office and see if the doctor you have chosen will accept you as a new patient and what types of options they offer for patients who are self-pay. Some doctors offer discounts or will set up payment plans for their patients who do not have insurance, but you will need to ask so you aren't surprised when you get to your appointment.  2) Contact Your Local Health Department Not all health departments have doctors that can see patients for sick visits, but many do, so it is worth a call to see if yours does. If you don't know where your local health department is, you can check in your phone book. The CDC also has a tool to help you locate your state's  health department, and many state websites also have listings of all of their local health departments.  3) Find a Walk-in Clinic If your illness is not likely to be very severe or complicated, you may want to try a walk in clinic. These are popping up all over the country in pharmacies, drugstores, and shopping centers. They're usually staffed by nurse practitioners or physician assistants that have been trained to treat common illnesses and complaints. They're usually fairly quick and inexpensive. However, if you have serious medical issues or chronic medical problems, these are probably not your best option  STD Testing - [facility] Department of [facility], STD Clinic, [address], [place], phone [phone] or [phone].  Monday - Friday, call for an appointment. [facility] Department of  [employer], STD Clinic, [address], [place], phone [phone] or [phone].  Monday - Friday, call for an appointment.  Abuse/Neglect: [facility] Child Abuse Hotline 240-545-8343 [facility] Child Abuse Hotline 651-386-9689 (After Hours)  Emergency Shelter:  [facility] (249)[phone]  Maternity Homes: - Room at the [place] [facility] (801)[phone] - [facility] [phone]  MRSA Hotline #:   817 362 3149  [facility] Resources [facility]  United Way [facility] Dept. [address]                 [address]         [address] [place] [address]  Blondell Reveal Phone:  [phone]                                  Phone:  [phone]                   Phone:  [phone]  [facility], [phone] - [facility] [place]- [phone]       -     [facility] in [place], [address]             [phone], Insurance  [place] Child Abuse Hotline (518)538-7289 or (561)174-5019 (After Hours)  Dental Assistance  If unable to pay or uninsured, contact:  [facility]. to become qualified for the adult dental clinic.  Patients with Medicaid: [facility] [phone] [address], [phone] [address] [phone]  If unable to pay, or uninsured, contact [facility] [phone] in [place], [phone] in [facility]) to become qualified for the adult dental clinic  Other Low-Cost Community Dental Services: - [facility]- [address] [place], [place], [place], [address], [phone], Ext. 123, 2nd and 4th Thursday of the month at 6:30am.  10 clients each day by appointment, can sometimes see walk-in patients if someone does not show for an  appointment. [facility]- [address] [place], [place], [address], [phone] -  [facility]- [address] [place], [place], [address], [phone] [facility] Health Department- [phone] [facility] Health Department- [phone] [facility] Health [phone]       Behavioral Health Resources in the [facility]  Intensive Outpatient Programs: [facility]      [address] [place], [place] [phone] Both a day and evening program       [facility] Outpatient     [address] [place] [address]        [place], [place] [address] [phone]         ADS: [facility] [address] [place] [phone]  [facility] Mental Health ACCESS LINE: [phone] or [phone] [address] [place], [place] [address] [email or web address]  Behavioral Health Services  Substance Abuse Resources: - [facility]  [phone]) [phone] - [facility] [phone] - The [phone] [place] [address] [phone] - Residential & Outpatient Substance Abuse Program  ([phone]  Psychological Services: [facility]  [phone] [facility]  [phone] - [facility], [phone] [place]. [address] [place], ACCESS LINE: (825) 527-7136 or [phone], [email or web address]  Mobile Crisis Teams:                                        Therapeutic Alternatives         Mobile Crisis Care Unit [phone]             [facility] [address] [place]in[place] Otto [phone]                                         Interventionist [address] [place] [phone]  Self-Help/Support Groups: Mental Health Assoc. of [employer] of support groups [phone] (call for more info)   Narcotics Anonymous  (NA) Caring Services [address] [place] - 2 meetings at this location  Residential Treatment Programs:  [facility]      [address]        [place]       [phone]         [facility] [address] [place] [address] [place], [place]  [phone]  [facility]  [address] [place], [place] [address] [phone] Admissions: 8am-3pm M-F  [facility]     [address]        [place], [place] [address]       [phone]         [facility] [address] [place], [place] [phone]  [facility] [address] [place], [place] [phone]  [facility] - Intensive Outpatient      [address]     [place], [place]       [phone]         [facility] (Addiction Recovery [facility]     [address] [place], [place] [phone] or [phone]  Residential Treatment Services (RTS), Medicaid [address] [place], [place] [phone]  [facility]  [address] [place] [phone]  [facility] Resources: [employer]- [phone]               General Therapy                                                Angie Fava, PhD        [address] [place], [place] [address]         [phone]   Insurance  [facility]al   [address] [place], [place] [address] [phone]  [facility] [address] [place], [place] [address] [phone] Insurance/Medicaid/sponsorship through [facility]                                              [address]                                        [place], [place] [address]    Therapy/tele-psych/case         [phone]          [facility] [address], [place]  [address]  Adolescent/group  home/case management [phone]                                           Creola Corn PhD       General therapy       Insurance   [phone]         Dr. Lolly Mustache, Insurance, M-F [phone]

## 2012-04-29 NOTE — ED Notes (Signed)
Pt c/o severe pain and EDP notified.

## 2012-04-29 NOTE — ED Notes (Signed)
Pt wants a prescription for Roxicet instead of percocet, EDP aware.

## 2012-05-02 LAB — URINE CULTURE: Colony Count: >=100000

## 2012-05-03 NOTE — ED Notes (Signed)
+   Urine Patient treated with Keflex-sensitive to same-chart appended per protocol MD. 

## 2012-06-21 ENCOUNTER — Encounter (HOSPITAL_COMMUNITY): Payer: Self-pay | Admitting: *Deleted

## 2012-06-21 ENCOUNTER — Emergency Department (HOSPITAL_COMMUNITY)
Admission: EM | Admit: 2012-06-21 | Discharge: 2012-06-21 | Disposition: A | Discharge status: Home / Self Care | Payer: Self-pay | Attending: Emergency Medicine | Admitting: Emergency Medicine

## 2012-06-21 DIAGNOSIS — R112 Nausea with vomiting, unspecified: Secondary | ICD-10-CM

## 2012-06-21 DIAGNOSIS — F1110 Opioid abuse, uncomplicated: Secondary | ICD-10-CM | POA: Insufficient documentation

## 2012-06-21 DIAGNOSIS — R197 Diarrhea, unspecified: Secondary | ICD-10-CM | POA: Insufficient documentation

## 2012-06-21 DIAGNOSIS — R109 Unspecified abdominal pain: Secondary | ICD-10-CM | POA: Insufficient documentation

## 2012-06-21 DIAGNOSIS — F1920 Other psychoactive substance dependence, uncomplicated: Secondary | ICD-10-CM | POA: Insufficient documentation

## 2012-06-21 DIAGNOSIS — F1121 Opioid dependence, in remission: Secondary | ICD-10-CM

## 2012-06-21 DIAGNOSIS — F17200 Nicotine dependence, unspecified, uncomplicated: Secondary | ICD-10-CM | POA: Insufficient documentation

## 2012-06-21 DIAGNOSIS — R011 Cardiac murmur, unspecified: Secondary | ICD-10-CM | POA: Insufficient documentation

## 2012-06-21 LAB — LIPASE, BLOOD: Lipase: 20 U/L (ref 11–59)

## 2012-06-21 LAB — CBC WITH DIFFERENTIAL/PLATELET
Eosinophils Relative: 0 % (ref 0–5)
HCT: 41 % (ref 36.0–46.0)
Lymphocytes Relative: 19 % (ref 12–46)
Lymphs Abs: 1.5 10*3/uL (ref 0.7–4.0)
MCV: 88.6 fL (ref 78.0–100.0)
Monocytes Absolute: 0.3 10*3/uL (ref 0.1–1.0)
Monocytes Relative: 4 % (ref 3–12)
RBC: 4.63 MIL/uL (ref 3.87–5.11)
WBC: 8.1 10*3/uL (ref 4.0–10.5)

## 2012-06-21 LAB — COMPREHENSIVE METABOLIC PANEL
ALT: 26 U/L (ref 0–35)
BUN: 12 mg/dL (ref 6–23)
CO2: 21 mEq/L (ref 19–32)
Calcium: 9.8 mg/dL (ref 8.4–10.5)
Creatinine, Ser: 0.68 mg/dL (ref 0.50–1.10)
GFR calc Af Amer: >90 mL/min (ref >90)
GFR calc non Af Amer: >90 mL/min (ref >90)
Glucose, Bld: 130 mg/dL — ABNORMAL HIGH (ref 70–99)
Sodium: 137 mEq/L (ref 135–145)

## 2012-06-21 MED ORDER — LORAZEPAM 1 MG PO TABS: 1.0000 mg | ORAL_TABLET | Freq: Four times a day (QID) | ORAL | Status: DC | PRN

## 2012-06-21 MED ORDER — GI COCKTAIL ~~LOC~~
30.0000 mL | Freq: Once | ORAL | Status: AC
  Administered 2012-06-21: 30 mL via ORAL
  Filled 2012-06-21: qty 30

## 2012-06-21 MED ORDER — SODIUM CHLORIDE 0.9 % IV SOLN
1000.0000 mL | Freq: Once | INTRAVENOUS | Status: AC
  Administered 2012-06-21: 1000 mL via INTRAVENOUS

## 2012-06-21 MED ORDER — PROMETHAZINE HCL 25 MG/ML IJ SOLN: 12.5000 mg | Freq: Once | INTRAMUSCULAR | Status: AC

## 2012-06-21 MED ORDER — FAMOTIDINE IN NACL 20-0.9 MG/50ML-% IV SOLN
20.0000 mg | Freq: Once | INTRAVENOUS | Status: AC
  Administered 2012-06-21: 20 mg via INTRAVENOUS
  Filled 2012-06-21: qty 50

## 2012-06-21 MED ORDER — PROMETHAZINE HCL 25 MG/ML IJ SOLN
INTRAMUSCULAR | Status: AC
  Administered 2012-06-21: 12.5 mg via INTRAVENOUS
  Filled 2012-06-21: qty 1

## 2012-06-21 MED ORDER — PROMETHAZINE HCL 25 MG RE SUPP: 25.0000 mg | Freq: Four times a day (QID) | RECTAL | Status: DC | PRN

## 2012-06-21 MED ORDER — ONDANSETRON HCL 4 MG/2ML IJ SOLN
4.0000 mg | Freq: Once | INTRAMUSCULAR | Status: AC
  Administered 2012-06-21: 4 mg via INTRAVENOUS
  Filled 2012-06-21: qty 2

## 2012-06-21 MED ORDER — METOCLOPRAMIDE HCL 5 MG/ML IJ SOLN
10.0000 mg | Freq: Once | INTRAMUSCULAR | Status: AC
  Administered 2012-06-21: 10 mg via INTRAVENOUS
  Filled 2012-06-21: qty 2

## 2012-06-21 MED ORDER — DIPHENHYDRAMINE HCL 50 MG/ML IJ SOLN
25.0000 mg | Freq: Once | INTRAMUSCULAR | Status: AC
  Administered 2012-06-21: 25 mg via INTRAVENOUS
  Filled 2012-06-21: qty 1

## 2012-06-21 MED ORDER — ONDANSETRON HCL 4 MG/2ML IJ SOLN
4.0000 mg | Freq: Four times a day (QID) | INTRAMUSCULAR | Status: DC | PRN
  Administered 2012-06-21: 4 mg via INTRAVENOUS
  Filled 2012-06-21: qty 2

## 2012-06-21 MED ORDER — SODIUM CHLORIDE 0.9 % IV SOLN
1000.0000 mL | INTRAVENOUS | Status: DC
  Administered 2012-06-21: 1000 mL via INTRAVENOUS

## 2012-06-21 MED ORDER — LORAZEPAM 2 MG/ML IJ SOLN
1.0000 mg | Freq: Once | INTRAMUSCULAR | Status: AC
  Administered 2012-06-21: 1 mg via INTRAVENOUS
  Filled 2012-06-21: qty 1

## 2012-06-21 NOTE — ED Notes (Signed)
Pt with abd pain with vomiting and diarrhea since last night, per pt hx of same and unable to find out cause

## 2012-06-21 NOTE — ED Provider Notes (Signed)
History  This chart was scribed for Ward Givens, MD by Bennett Scrape, ED Scribe. This patient was seen in room APA01/APA01 and the patient's care was started at 1:37 PM.  CSN: [id number]  Arrival date & time 06/21/12  1305   First MD Initiated Contact with Patient 06/21/12 1337      Chief Complaint  Patient presents with  . Emesis  . Abdominal Pain  . Diarrhea    Patient is a 27 y.o. female presenting with abdominal pain. The history is provided by the patient. No language interpreter was used.  Abdominal Pain Pain location:  LUQ, RUQ and epigastric Pain quality: sharp   Pain radiates to:  Does not radiate Onset quality:  Gradual Duration:  12 hours Timing:  Constant Progression:  Worsening Chronicity:  Recurrent Associated symptoms: diarrhea, nausea and vomiting   Associated symptoms: no fever      Chelsea May is a 27 y.o. female who presents to the Emergency Department complaining of approximately 12 hours of gradual onset, gradually worsening, constant upper abdominal pain described as sharp with associated "a million" episodes of emesis and 2 to 3 episodes of diarrhea. She reports having frequent episodes of the same and reports that she is treated with medications (Ativan, dilaudid and phenergan) in the ED. She states that her last visit to the ED for the same was "a really long time ago. I have been doing so well". She denies having any prior testing or following up with a GI specialist. She states that she took some imodium with improvement in the diarrhea. She reports taking tagamet for acid but states that she last took the medication several days ago. She reports burning and an acid taste in her throat after vomiting which is similar to her prior episodes. She denies having any sick contacts with similar symptoms and denies any suspect food intakes. She denies being on any daily prescribed medications. She denies fevers, cough or chills as associated symptoms. She  denies smoking and alcohol use. 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 She states that she completed detox at the end of February. She denies any increased stress. She states she usually gets morphine or dilaudid for pain.  No PCP.  Past Medical History  Diagnosis Date  . Substance abuse     opiates, detox 05/2011  . Heart murmur   . Hx of nausea and vomiting     Past Surgical History  Procedure Laterality Date  . Back surgery      "tumor" removed from her back    History reviewed. No pertinent family history.  History  Substance Use Topics  . Smoking status: Current Every Day Smoker -- 0.50 packs/day    Types: Cigarettes  . Smokeless tobacco: Not on file  . Alcohol Use: No     Comment: occ  Pt reports that she has children Unemployed   No OB history provided.  Review of Systems  Constitutional: Negative for fever.  Gastrointestinal: Positive for nausea, vomiting, abdominal pain and diarrhea.    Allergies  Review of patient's allergies indicates no known allergies.  Home Medications   Current Outpatient Rx  Name  Route  Sig  Dispense  Refill  . etonogestrel (IMPLANON) 68 MG IMPL implant   Subcutaneous   Inject 1 each (68 mg total) into the skin once. Birth control method.  No prescription given !!   1 each   0  Triage Vitals: BP 100/68  Pulse 135  Temp(Src) 97.9 F (36.6 C) (Oral)  Resp 20  Ht 5\' 2"  (1.575 m)  Wt 120 lb (54.432 kg)  BMI 21.94 kg/m2  SpO2 100%  Vital signs normal except for tachycardia   Physical Exam  Nursing note and vitals reviewed. Constitutional: She is oriented to person, place, and time. She appears well-developed and well-nourished.  Non-toxic appearance. She does not appear ill. No distress.  Patient could be heard in triage retching mildly  HENT:  Head: Normocephalic and atraumatic.  Right Ear: External ear normal.  Left Ear: External ear normal.  Nose: Nose normal. No  mucosal edema or rhinorrhea.  Mouth/Throat: Oropharynx is clear and moist. No dental abscesses or edematous.  Minimally dry tongue  Eyes: Conjunctivae and EOM are normal. Pupils are equal, round, and reactive to light.  Neck: Normal range of motion and full passive range of motion without pain. Neck supple.  Cardiovascular: Normal rate, regular rhythm and normal heart sounds.  Exam reveals no gallop and no friction rub.   No murmur heard. Pulmonary/Chest: Effort normal and breath sounds normal. No respiratory distress. She has no wheezes. She has no rhonchi. She has no rales. She exhibits no tenderness and no crepitus.  Abdominal: Soft. Normal appearance and bowel sounds are normal. She exhibits no distension. There is generalized tenderness. There is no rebound and no guarding.  Diffuse upper abdominal tenderness mostly centered around the epigastric  Musculoskeletal: Normal range of motion. She exhibits no edema and no tenderness.  Moves all extremities well.   Neurological: She is alert and oriented to person, place, and time. She has normal strength. No cranial nerve deficit.  Skin: Skin is warm, dry and intact. No rash noted. No erythema. There is pallor.  Psychiatric: Her speech is normal and behavior is normal. Her mood appears not anxious.  Flat affect    ED Course  Procedures (including critical care time) Medications  0.9 %  sodium chloride infusion (0 mLs Intravenous Stopped 06/21/12 1639)    Followed by  0.9 %  sodium chloride infusion (0 mLs Intravenous Stopped 06/21/12 1639)    Followed by  0.9 %  sodium chloride infusion (0 mLs Intravenous Stopped 06/21/12 1910)  ondansetron (ZOFRAN) injection 4 mg (4 mg Intravenous Given 06/21/12 1616)  ondansetron (ZOFRAN) injection 4 mg (4 mg Intravenous Given 06/21/12 1333)  metoCLOPramide (REGLAN) injection 10 mg (10 mg Intravenous Given 06/21/12 1433)  diphenhydrAMINE (BENADRYL) injection 25 mg (25 mg Intravenous Given 06/21/12 1433)   LORazepam (ATIVAN) injection 1 mg (1 mg Intravenous Given 06/21/12 1433)  famotidine (PEPCID) IVPB 20 mg (0 mg Intravenous Stopped 06/21/12 1639)  gi cocktail (Maalox,Lidocaine,Donnatal) (30 mLs Oral Given 06/21/12 1616)  promethazine (PHENERGAN) injection 12.5 mg (12.5 mg Intravenous Given 06/21/12 1718)  promethazine (PHENERGAN) injection 12.5 mg (12.5 mg Intravenous Given 06/21/12 1830)     DIAGNOSTIC STUDIES: Oxygen Saturation is 100% on room air, normal by my interpretation.    COORDINATION OF CARE: 2:18 PM-Discussed treatment plan which includes medications and CBC panel with pt at bedside and pt agreed to plan.   2:30 PM- Ordered 4 mg Zofran injection, 20 mg Pepcid injection, 1 mg Ativan injection, 25 mg Benadryl injection and 10 mg Reglan injection  Pt given IV fluids and several nausea meds. At one point she said she wasn't asking to get pain medications, she just wanted to get phenergan suppositories.  Also requesting more ativan at time of discharge.   1. Nausea  vomiting and diarrhea   2. History of narcotic addiction     New Prescriptions   LORAZEPAM (ATIVAN) 1 MG TABLET    Take 1 tablet (1 mg total) by mouth every 6 (six) hours as needed (abdominal spasms).   PROMETHAZINE (PHENERGAN) 25 MG SUPPOSITORY    Place 1 suppository (25 mg total) rectally every 6 (six) hours as needed for nausea.   Plan discharge  Devoria Albe, MD, FACEP   MDM  I personally performed the services described in this documentation, which was scribed in my presence. The recorded information has been reviewed and considered.  Devoria Albe, MD, Armando Gang         Ward Givens, MD 06/21/12 [phone]

## 2012-06-23 ENCOUNTER — Emergency Department (HOSPITAL_COMMUNITY): Payer: Self-pay

## 2012-06-23 ENCOUNTER — Encounter (HOSPITAL_COMMUNITY): Payer: Self-pay | Admitting: *Deleted

## 2012-06-23 ENCOUNTER — Emergency Department (HOSPITAL_COMMUNITY)
Admission: EM | Admit: 2012-06-23 | Discharge: 2012-06-23 | Disposition: A | Discharge status: Home / Self Care | Payer: Self-pay | Attending: Emergency Medicine | Admitting: Emergency Medicine

## 2012-06-23 DIAGNOSIS — F1210 Cannabis abuse, uncomplicated: Secondary | ICD-10-CM | POA: Insufficient documentation

## 2012-06-23 DIAGNOSIS — Z3202 Encounter for pregnancy test, result negative: Secondary | ICD-10-CM | POA: Insufficient documentation

## 2012-06-23 DIAGNOSIS — R109 Unspecified abdominal pain: Secondary | ICD-10-CM | POA: Insufficient documentation

## 2012-06-23 DIAGNOSIS — R031 Nonspecific low blood-pressure reading: Secondary | ICD-10-CM | POA: Insufficient documentation

## 2012-06-23 DIAGNOSIS — Z765 Malingerer [conscious simulation]: Secondary | ICD-10-CM | POA: Insufficient documentation

## 2012-06-23 DIAGNOSIS — F17200 Nicotine dependence, unspecified, uncomplicated: Secondary | ICD-10-CM | POA: Insufficient documentation

## 2012-06-23 DIAGNOSIS — R197 Diarrhea, unspecified: Secondary | ICD-10-CM | POA: Insufficient documentation

## 2012-06-23 DIAGNOSIS — R112 Nausea with vomiting, unspecified: Secondary | ICD-10-CM | POA: Insufficient documentation

## 2012-06-23 DIAGNOSIS — R011 Cardiac murmur, unspecified: Secondary | ICD-10-CM | POA: Insufficient documentation

## 2012-06-23 LAB — PREGNANCY, URINE: Preg Test, Ur: NEGATIVE

## 2012-06-23 LAB — URINALYSIS, ROUTINE W REFLEX MICROSCOPIC
Hgb urine dipstick: NEGATIVE
Leukocytes, UA: NEGATIVE
Protein, ur: NEGATIVE mg/dL
Specific Gravity, Urine: 1.01 (ref 1.005–1.030)
Urobilinogen, UA: 0.2 mg/dL (ref 0.0–1.0)

## 2012-06-23 LAB — LIPASE, BLOOD: Lipase: 20 U/L (ref 11–59)

## 2012-06-23 LAB — COMPREHENSIVE METABOLIC PANEL
AST: 15 U/L (ref 0–37)
Albumin: 4.3 g/dL (ref 3.5–5.2)
BUN: 8 mg/dL (ref 6–23)
Calcium: 9.4 mg/dL (ref 8.4–10.5)
Chloride: 102 mEq/L (ref 96–112)
Creatinine, Ser: 0.7 mg/dL (ref 0.50–1.10)
GFR calc non Af Amer: >90 mL/min (ref >90)
Total Bilirubin: 0.3 mg/dL (ref 0.3–1.2)

## 2012-06-23 LAB — CBC WITH DIFFERENTIAL/PLATELET
Basophils Absolute: 0 10*3/uL (ref 0.0–0.1)
Basophils Relative: 0 % (ref 0–1)
Lymphocytes Relative: 20 % (ref 12–46)
MCHC: 34.6 g/dL (ref 30.0–36.0)
Neutro Abs: 6.7 10*3/uL (ref 1.7–7.7)
Neutrophils Relative %: 75 % (ref 43–77)
Platelets: 227 10*3/uL (ref 150–400)
RDW: 13.9 % (ref 11.5–15.5)
WBC: 8.8 10*3/uL (ref 4.0–10.5)

## 2012-06-23 LAB — OCCULT BLOOD, POC DEVICE: Fecal Occult Bld: NEGATIVE

## 2012-06-23 MED ORDER — PROMETHAZINE HCL 25 MG/ML IJ SOLN
25.0000 mg | Freq: Once | INTRAMUSCULAR | Status: AC
  Administered 2012-06-23: 25 mg via INTRAMUSCULAR
  Filled 2012-06-23: qty 1

## 2012-06-23 MED ORDER — ONDANSETRON HCL 4 MG/2ML IJ SOLN
4.0000 mg | Freq: Once | INTRAMUSCULAR | Status: AC
  Administered 2012-06-23: 4 mg via INTRAVENOUS
  Filled 2012-06-23: qty 2

## 2012-06-23 MED ORDER — ONDANSETRON 4 MG PO TBDP: 4.0000 mg | ORAL_TABLET | Freq: Three times a day (TID) | ORAL | Status: DC | PRN

## 2012-06-23 MED ORDER — ONDANSETRON HCL 4 MG/2ML IJ SOLN: 4.0000 mg | Freq: Once | INTRAMUSCULAR | Status: AC

## 2012-06-23 MED ORDER — SODIUM CHLORIDE 0.9 % IV BOLUS (SEPSIS)
1000.0000 mL | Freq: Once | INTRAVENOUS | Status: AC
  Administered 2012-06-23: 1000 mL via INTRAVENOUS

## 2012-06-23 MED ORDER — PROMETHAZINE HCL 25 MG RE SUPP: 25.0000 mg | Freq: Four times a day (QID) | RECTAL | Status: DC | PRN

## 2012-06-23 MED ORDER — HYDROMORPHONE HCL PF 1 MG/ML IJ SOLN
1.0000 mg | Freq: Once | INTRAMUSCULAR | Status: AC
  Administered 2012-06-23: 1 mg via INTRAVENOUS
  Filled 2012-06-23: qty 1

## 2012-06-23 MED ORDER — LORAZEPAM 2 MG/ML IJ SOLN
1.0000 mg | Freq: Once | INTRAMUSCULAR | Status: AC
  Administered 2012-06-23: 1 mg via INTRAVENOUS
  Filled 2012-06-23: qty 1

## 2012-06-23 NOTE — ED Notes (Signed)
Discharge instructions reviewed with pt, questions answered. Pt verbalized understanding.  

## 2012-06-23 NOTE — ED Provider Notes (Signed)
History     CSN: [id number]  Arrival date & time 06/23/12  1229   First MD Initiated Contact with Patient 06/23/12 1253      Chief Complaint  Patient presents with  . Nausea  . Emesis    (Consider location/radiation/quality/duration/timing/severity/associated sxs/prior treatment) HPI Comments: 27 year old female who has a history of frequent nausea and vomiting episodes and marijuana use who presents with a complaint of nausea vomiting and diarrhea. She states this started about 3 days ago, this is a recurrent episode, she has been seen several times in the past for similar symptoms. She was last seen 2 days ago, fell when she did not improve when she left the hospital, has continued to try to take rectal Phenergan since that time with only minimal improvement. She continues to vomit and have watery diarrhea. She does admit to using marijuana, she has used marijuana since the age of 82, a total of 11 years. She denies abdominal surgery and still has her gallbladder, has not had pancreatitis and denies using alcohol. Symptoms are severe, nothing makes better or worse  Patient is a 27 y.o. female presenting with vomiting. The history is provided by the patient and medical records.  Emesis   Past Medical History  Diagnosis Date  . Substance abuse     opiates, detox 05/2011  . Heart murmur   . Hx of nausea and vomiting     Past Surgical History  Procedure Laterality Date  . Back surgery      "tumor" removed from her back    History reviewed. No pertinent family history.  History  Substance Use Topics  . Smoking status: Current Every Day Smoker -- 0.50 packs/day    Types: Cigarettes  . Smokeless tobacco: Not on file  . Alcohol Use: No     Comment: occ    OB History   Grav Para Term Preterm Abortions TAB SAB Ect Mult Living   2 2 2              Review of Systems  Gastrointestinal: Positive for vomiting.  All other systems reviewed and are negative.    Allergies   Review of patient's allergies indicates no known allergies.  Home Medications   Current Outpatient Rx  Name  Route  Sig  Dispense  Refill  . promethazine (PHENERGAN) 25 MG suppository   Rectal   Place 1 suppository (25 mg total) rectally every 6 (six) hours as needed for nausea.   6 each   0   . etonogestrel (IMPLANON) 68 MG IMPL implant   Subcutaneous   Inject 1 each (68 mg total) into the skin once. Birth control method.  No prescription given !!   1 each   0   . ondansetron (ZOFRAN ODT) 4 MG disintegrating tablet   Oral   Take 1 tablet (4 mg total) by mouth every 8 (eight) hours as needed for nausea.   10 tablet   0   . promethazine (PHENERGAN) 25 MG suppository   Rectal   Place 1 suppository (25 mg total) rectally every 6 (six) hours as needed for nausea.   12 each   0     BP 102/69  Pulse 89  Temp(Src) 98.4 F (36.9 C) (Oral)  Resp 18  Ht 5\' 2"  (1.575 m)  Wt 120 lb (54.432 kg)  BMI 21.94 kg/m2  SpO2 99%  Physical Exam  Nursing note and vitals reviewed. Constitutional: She appears well-developed and well-nourished. No distress.  HENT:  Head: Normocephalic and atraumatic.  Mouth/Throat: No oropharyngeal exudate.  Mucous membranes mildly dehydrated  Eyes: Conjunctivae and EOM are normal. Pupils are equal, round, and reactive to light. Right eye exhibits no discharge. Left eye exhibits no discharge. No scleral icterus.  Neck: Normal range of motion. Neck supple. No JVD present. No thyromegaly present.  Cardiovascular: Normal rate, regular rhythm, normal heart sounds and intact distal pulses.  Exam reveals no gallop and no friction rub.   No murmur heard. Pulmonary/Chest: Effort normal and breath sounds normal. No respiratory distress. She has no wheezes. She has no rales.  Abdominal: Soft. Bowel sounds are normal. She exhibits no distension and no mass. There is tenderness ( Tenderness located primarily in the epigastrium and less so in the right upper  quadrant, no pain at McBurney's point, no peritoneal signs, normal bowel sounds).  Musculoskeletal: Normal range of motion. She exhibits no edema and no tenderness.  Lymphadenopathy:    She has no cervical adenopathy.  Neurological: She is alert. Coordination normal.  Skin: Skin is warm and dry. No rash noted. No erythema.  Psychiatric: She has a normal mood and affect. Her behavior is normal.    ED Course  Procedures (including critical care time)  Labs Reviewed  COMPREHENSIVE METABOLIC PANEL - Abnormal; Notable for the following:    Glucose, Bld 112 (*)    All other components within normal limits  LIPASE, BLOOD  URINALYSIS, ROUTINE W REFLEX MICROSCOPIC  PREGNANCY, URINE  CBC WITH DIFFERENTIAL  OCCULT BLOOD, POC DEVICE   US Abdomen Complete  06/23/2012  *RADIOLOGY REPORT*  Clinical Data:  Right upper quadrant abdominal pain and epigastric abdominal pain.  Nausea and vomiting.  COMPLETE ABDOMINAL ULTRASOUND  Comparison:  CT abdomen and pelvis 05/31/2008.  Findings:  Gallbladder:  No shadowing gallstones or echogenic sludge.  No gallbladder wall thickening or pericholecystic fluid.  Negative sonographic Murphy's sign according to the ultrasound technologist.  Common bile duct:  Upper normal in caliber proximally measuring 7 mm diameter, tapering to approximately 3 mm diameter distally.  No intraluminal mass or stone.  Liver:  Normal size and echotexture without focal parenchymal abnormality.  Patent portal vein with hepatopetal flow.  IVC:  Patent.  Pancreas:  Normal size and echotexture without focal parenchymal abnormality.  Spleen:  Upper normal in size to slightly enlarged measuring approximately 12.7 x 5.1 x 13.3 cm, yielding a volume of approximately 430 ml.  No focal splenic parenchymal abnormality.  Right Kidney:  Normal in size and parenchymal echotexture without focal parenchymal abnormality, measuring approximately 11.2 cm in length.  Scattered echogenic foci within the renal  pyramids which could represent tiny calculi.  Extrarenal pelvis as noted on the prior CT, accounting for apparent cystic structure at the renal hilum.  Left Kidney:  Normal in size and parenchymal echotexture without focal parenchymal abnormality, measuring approximately 12.1 cm in length.  Again, scattered echogenic foci within the renal pyramids.  Abdominal aorta:  Normal in caliber throughout its visualized course in the abdomen without significant atherosclerosis.  IMPRESSION:  1.  Normal-appearing gallbladder without evidence of cholelithiasis or acute cholecystitis. 2.  Scattered echogenic foci within the renal pyramids bilaterally which could represent very small calculi.  No evidence of urinary tract obstruction. 3.  Borderline to mild splenomegaly without focal splenic parenchymal abnormality.   Original Report Authenticated By: Hulan Saas, M.D.      1. Nausea vomiting and diarrhea       MDM  There is no CVA tenderness, no suprapubic  or right lower quadrant tenderness, vital signs remain normal she is borderline hypotensive she is also very thin girl who has not had much to drink in the last couple of days. She will need IV fluids, antiemetics, pain medication for her abdominal pain and the workup for pancreatitis, cholecystitis. Would consider cyclic vomiting syndrome especially given the patient's history of marijuana and the possibility that this has a leg and the medical literature. Reevaluate after ultrasound and medicines   Pt has benign appearance at this time - labs are all normal without dehydration / UTI / or any blood abnormalities.  VS normal, pt has been given medicine for pain and nausea and is still asking for more.  PT has some drug seeking behavious but [facility] have currently been stabilized and she is stable for d/c  Medications  sodium chloride 0.9 % bolus 1,000 mL (1,000 mLs Intravenous New Bag/Given 06/23/12 1506)  sodium chloride 0.9 % bolus 1,000 mL (0 mLs Intravenous  Stopped 06/23/12 1507)  HYDROmorphone (DILAUDID) injection 1 mg (1 mg Intravenous Given 06/23/12 1339)  promethazine (PHENERGAN) injection 25 mg (25 mg Intramuscular Given 06/23/12 1332)  LORazepam (ATIVAN) injection 1 mg (1 mg Intravenous Given 06/23/12 1332)    New Prescriptions   ONDANSETRON (ZOFRAN ODT) 4 MG DISINTEGRATING TABLET    Take 1 tablet (4 mg total) by mouth every 8 (eight) hours as needed for nausea.   PROMETHAZINE (PHENERGAN) 25 MG SUPPOSITORY    Place 1 suppository (25 mg total) rectally every 6 (six) hours as needed for nausea.       Vida Roller, MD 06/23/12 [phone]

## 2012-06-23 NOTE — ED Notes (Signed)
N/V/D began 3 days ago.  Nothing has made it any better.

## 2012-06-23 NOTE — ED Notes (Signed)
Patient transported to Ultrasound 

## 2012-06-23 NOTE — ED Notes (Signed)
Pt requesting pain medicine.

## 2012-06-23 NOTE — ED Notes (Signed)
She did not have any orders for pain meds

## 2012-08-29 ENCOUNTER — Encounter (HOSPITAL_COMMUNITY): Payer: Self-pay | Admitting: Emergency Medicine

## 2012-08-29 ENCOUNTER — Emergency Department (HOSPITAL_COMMUNITY)
Admission: EM | Admit: 2012-08-29 | Discharge: 2012-08-29 | Disposition: A | Discharge status: Home / Self Care | Payer: Self-pay | Attending: Emergency Medicine | Admitting: Emergency Medicine

## 2012-08-29 DIAGNOSIS — R350 Frequency of micturition: Secondary | ICD-10-CM | POA: Insufficient documentation

## 2012-08-29 DIAGNOSIS — Z3202 Encounter for pregnancy test, result negative: Secondary | ICD-10-CM | POA: Insufficient documentation

## 2012-08-29 DIAGNOSIS — R011 Cardiac murmur, unspecified: Secondary | ICD-10-CM | POA: Insufficient documentation

## 2012-08-29 DIAGNOSIS — Z79899 Other long term (current) drug therapy: Secondary | ICD-10-CM | POA: Insufficient documentation

## 2012-08-29 DIAGNOSIS — R300 Dysuria: Secondary | ICD-10-CM | POA: Insufficient documentation

## 2012-08-29 DIAGNOSIS — F17200 Nicotine dependence, unspecified, uncomplicated: Secondary | ICD-10-CM | POA: Insufficient documentation

## 2012-08-29 DIAGNOSIS — N390 Urinary tract infection, site not specified: Secondary | ICD-10-CM | POA: Insufficient documentation

## 2012-08-29 LAB — URINALYSIS, ROUTINE W REFLEX MICROSCOPIC
Bilirubin Urine: NEGATIVE
Glucose, UA: 250 mg/dL — AB
Hgb urine dipstick: NEGATIVE
Leukocytes, UA: NEGATIVE
Nitrite: POSITIVE — AB
Protein, ur: 100 mg/dL — AB
Specific Gravity, Urine: 1.015 (ref 1.005–1.030)
Urobilinogen, UA: >8 mg/dL — ABNORMAL HIGH (ref 0.0–1.0)
pH: 6.5 (ref 5.0–8.0)

## 2012-08-29 LAB — PREGNANCY, URINE: Preg Test, Ur: NEGATIVE

## 2012-08-29 LAB — URINE MICROSCOPIC-ADD ON

## 2012-08-29 MED ORDER — PHENAZOPYRIDINE HCL 100 MG PO TABS
200.0000 mg | ORAL_TABLET | Freq: Once | ORAL | Status: AC
  Administered 2012-08-29: 200 mg via ORAL
  Filled 2012-08-29: qty 2

## 2012-08-29 MED ORDER — NITROFURANTOIN MACROCRYSTAL 100 MG PO CAPS: 100.0000 mg | ORAL_CAPSULE | Freq: Four times a day (QID) | ORAL | Status: DC

## 2012-08-29 MED ORDER — PHENAZOPYRIDINE HCL 200 MG PO TABS: 200.0000 mg | ORAL_TABLET | Freq: Three times a day (TID) | ORAL | Status: DC

## 2012-08-29 MED ORDER — NITROFURANTOIN MACROCRYSTAL 100 MG PO CAPS
100.0000 mg | ORAL_CAPSULE | Freq: Once | ORAL | Status: AC
  Administered 2012-08-29: 100 mg via ORAL
  Filled 2012-08-29: qty 1

## 2012-08-29 NOTE — ED Notes (Signed)
Patient complaining of burning and pain on urination, hematuria, lower abdominal pain, and bilateral flank pain. Reports started approximately three days ago.

## 2012-08-29 NOTE — ED Provider Notes (Signed)
History     CSN: [id number]  Arrival date & time 08/29/12  4098   First MD Initiated Contact with Patient 08/29/12 [phone]      Chief Complaint  Patient presents with  . Abdominal Pain  . Flank Pain  . Urinary Frequency    (Consider location/radiation/quality/duration/timing/severity/associated sxs/prior treatment) HPI HPI Comments: Chelsea May is a 27 y.o. female who presents to the Emergency Department complaining of abdominal pain and bilateral flank pain associated with dysuria and burning with urination that began three days ago. She has taken AZO with no relief. Denies fever, chills, nausea, vomiting.    Past Medical History  Diagnosis Date  . Substance abuse     opiates, detox 05/2011  . Heart murmur   . Hx of nausea and vomiting     Past Surgical History  Procedure Laterality Date  . Back surgery      "tumor" removed from her back    History reviewed. No pertinent family history.  History  Substance Use Topics  . Smoking status: Current Every Day Smoker -- 0.50 packs/day    Types: Cigarettes  . Smokeless tobacco: Not on file  . Alcohol Use: No     Comment: occ    OB History   Grav Para Term Preterm Abortions TAB SAB Ect Mult Living   2 2 2              Review of Systems  Constitutional: Negative for fever.       10 Systems reviewed and are negative for acute change except as noted in the HPI.  HENT: Negative for congestion.   Eyes: Negative for discharge and redness.  Respiratory: Negative for cough and shortness of breath.   Cardiovascular: Negative for chest pain.  Gastrointestinal: Positive for abdominal pain. Negative for vomiting.  Genitourinary: Positive for dysuria and flank pain.  Musculoskeletal: Negative for back pain.  Skin: Negative for rash.  Neurological: Negative for syncope, numbness and headaches.  Psychiatric/Behavioral:       No behavior change.    Allergies  Review of patient's allergies indicates no known  allergies.  Home Medications   Current Outpatient Rx  Name  Route  Sig  Dispense  Refill  . etonogestrel (IMPLANON) 68 MG IMPL implant   Subcutaneous   Inject 1 each (68 mg total) into the skin once. Birth control method.  No prescription given !!   1 each   0   . ondansetron (ZOFRAN ODT) 4 MG disintegrating tablet   Oral   Take 1 tablet (4 mg total) by mouth every 8 (eight) hours as needed for nausea.   10 tablet   0   . promethazine (PHENERGAN) 25 MG tablet   Oral   Take 25 mg by mouth every 6 (six) hours as needed for nausea.         . nitrofurantoin (MACRODANTIN) 100 MG capsule   Oral   Take 1 capsule (100 mg total) by mouth 4 (four) times daily.   40 capsule   0   . phenazopyridine (PYRIDIUM) 200 MG tablet   Oral   Take 1 tablet (200 mg total) by mouth 3 (three) times daily.   6 tablet   0   . promethazine (PHENERGAN) 25 MG suppository   Rectal   Place 1 suppository (25 mg total) rectally every 6 (six) hours as needed for nausea.   6 each   0   . promethazine (PHENERGAN) 25 MG suppository   Rectal  Place 1 suppository (25 mg total) rectally every 6 (six) hours as needed for nausea.   12 each   0     BP 94/53  Pulse 105  Temp(Src) 97.7 F (36.5 C) (Oral)  Resp 18  Ht 5\' 2"  (1.575 m)  Wt 116 lb (52.617 kg)  BMI 21.21 kg/m2  SpO2 98%  Physical Exam  Nursing note and vitals reviewed. Constitutional: She appears well-developed and well-nourished.  Awake, alert, nontoxic appearance.  HENT:  Head: Normocephalic and atraumatic.  Eyes: EOM are normal. Pupils are equal, round, and reactive to light.  Neck: Normal range of motion. Neck supple.  Cardiovascular: Normal rate and intact distal pulses.   Pulmonary/Chest: Effort normal and breath sounds normal. She exhibits no tenderness.  Abdominal: Soft. Bowel sounds are normal. There is no tenderness. There is no rebound.  Genitourinary:  No cva tenderness  Musculoskeletal: She exhibits no  tenderness.  Baseline ROM, no obvious new focal weakness.  Neurological:  Mental status and motor strength appears baseline for patient and situation.  Skin: No rash noted.  Psychiatric: She has a normal mood and affect.    ED Course  Procedures (including critical care time) Results for orders placed during the hospital encounter of 08/29/12  URINALYSIS, ROUTINE W REFLEX MICROSCOPIC      Result Value Range   Color, Urine ORANGE (*) YELLOW   APPearance CLEAR  CLEAR   Specific Gravity, Urine 1.015  1.005 - 1.030   pH 6.5  5.0 - 8.0   Glucose, UA 250 (*) NEGATIVE mg/dL   Hgb urine dipstick NEGATIVE  NEGATIVE   Bilirubin Urine NEGATIVE  NEGATIVE   Ketones, ur TRACE (*) NEGATIVE mg/dL   Protein, ur 829 (*) NEGATIVE mg/dL   Urobilinogen, UA >5.6 (*) 0.0 - 1.0 mg/dL   Nitrite POSITIVE (*) NEGATIVE   Leukocytes, UA NEGATIVE  NEGATIVE  PREGNANCY, URINE      Result Value Range   Preg Test, Ur NEGATIVE  NEGATIVE  URINE MICROSCOPIC-ADD ON      Result Value Range   Squamous Epithelial / LPF FEW (*) RARE   WBC, UA 3-6  <3 WBC/hpf   RBC / HPF 0-2  <3 RBC/hpf   Bacteria, UA FEW (*) RARE    Medications  phenazopyridine (PYRIDIUM) tablet 200 mg (200 mg Oral Given 08/29/12 0403)  nitrofurantoin (MACRODANTIN) capsule 100 mg (100 mg Oral Given 08/29/12 0404)    1. UTI (lower urinary tract infection)       MDM  Patient with urinary symptoms x 3 days. UA with evidence of infection. Given macrodantin and pyridium.Pt stable in ED with no significant deterioration in condition.The patient appears reasonably screened and/or stabilized for discharge and I doubt any other medical condition or other [facility] requiring further screening, evaluation, or treatment in the ED at this time prior to discharge.  MDM Reviewed: nursing note and vitals Interpretation: labs           Nicoletta Dress. Colon Branch, MD 08/29/12 [phone]

## 2012-08-30 LAB — URINE CULTURE: Colony Count: 25000

## 2012-09-26 ENCOUNTER — Emergency Department: Payer: Self-pay | Admitting: Emergency Medicine

## 2012-09-26 LAB — CBC
HCT: 34.1 % — ABNORMAL LOW (ref 35.0–47.0)
HGB: 11.7 g/dL — ABNORMAL LOW (ref 12.0–16.0)
MCH: 31.3 pg (ref 26.0–34.0)
Platelet: 203 10*3/uL (ref 150–440)
RDW: 12.8 % (ref 11.5–14.5)
WBC: 5.3 10*3/uL (ref 3.6–11.0)

## 2012-09-26 LAB — ETHANOL: Ethanol %: <0.003 % (ref 0.000–0.080)

## 2012-09-26 LAB — COMPREHENSIVE METABOLIC PANEL
Albumin: 3.6 g/dL (ref 3.4–5.0)
Co2: 25 mmol/L (ref 21–32)
Creatinine: 0.99 mg/dL (ref 0.60–1.30)
Osmolality: 283 (ref 275–301)
Potassium: 3.5 mmol/L (ref 3.5–5.1)
SGOT(AST): 58 U/L — ABNORMAL HIGH (ref 15–37)
Sodium: 141 mmol/L (ref 136–145)
Total Protein: 6.9 g/dL (ref 6.4–8.2)

## 2012-09-26 LAB — ACETAMINOPHEN LEVEL: Acetaminophen: <2 ug/mL

## 2012-09-26 LAB — SALICYLATE LEVEL: Salicylates, Serum: <1.7 mg/dL

## 2012-09-26 LAB — TSH: Thyroid Stimulating Horm: 0.42 u[IU]/mL — ABNORMAL LOW

## 2013-03-19 ENCOUNTER — Emergency Department: Payer: Self-pay | Admitting: Emergency Medicine

## 2013-03-19 LAB — CBC
HGB: 13.1 g/dL (ref 12.0–16.0)
MCH: 31.4 pg (ref 26.0–34.0)
MCHC: 34.5 g/dL (ref 32.0–36.0)
MCV: 91 fL (ref 80–100)
Platelet: 209 10*3/uL (ref 150–440)
WBC: 5.9 10*3/uL (ref 3.6–11.0)

## 2013-03-19 LAB — COMPREHENSIVE METABOLIC PANEL
BUN: 15 mg/dL (ref 7–18)
Bilirubin,Total: 0.3 mg/dL (ref 0.2–1.0)
Calcium, Total: 9.1 mg/dL (ref 8.5–10.1)
Chloride: 104 mmol/L (ref 98–107)
Creatinine: 0.82 mg/dL (ref 0.60–1.30)
EGFR (African American): >60
EGFR (Non-African Amer.): >60
Glucose: 99 mg/dL (ref 65–99)
Osmolality: 280 (ref 275–301)
SGOT(AST): 32 U/L (ref 15–37)
SGPT (ALT): 55 U/L (ref 12–78)
Sodium: 140 mmol/L (ref 136–145)
Total Protein: 7.4 g/dL (ref 6.4–8.2)

## 2013-03-19 LAB — URINALYSIS, COMPLETE
Leukocyte Esterase: NEGATIVE
Ph: 5 (ref 4.5–8.0)
Protein: 30
RBC,UR: 105 /HPF (ref 0–5)
Specific Gravity: 1.021 (ref 1.003–1.030)
Squamous Epithelial: 1
WBC UR: 13 /HPF (ref 0–5)

## 2013-03-19 LAB — DRUG SCREEN, URINE
Amphetamines, Ur Screen: NEGATIVE (ref ?–1000)
Cannabinoid 50 Ng, Ur ~~LOC~~: POSITIVE (ref ?–50)
Cocaine Metabolite,Ur ~~LOC~~: NEGATIVE (ref ?–300)

## 2013-03-19 LAB — ACETAMINOPHEN LEVEL: Acetaminophen: <2 ug/mL

## 2013-03-19 LAB — ETHANOL
Ethanol %: <0.003 % (ref 0.000–0.080)
Ethanol: <3 mg/dL

## 2013-03-21 ENCOUNTER — Emergency Department: Payer: Self-pay | Admitting: Emergency Medicine

## 2013-03-21 LAB — COMPREHENSIVE METABOLIC PANEL
Albumin: 4.1 g/dL (ref 3.4–5.0)
Alkaline Phosphatase: 83 U/L
Anion Gap: 4 — ABNORMAL LOW (ref 7–16)
Bilirubin,Total: 0.2 mg/dL (ref 0.2–1.0)
Chloride: 107 mmol/L (ref 98–107)
Co2: 29 mmol/L (ref 21–32)
Glucose: 97 mg/dL (ref 65–99)
SGPT (ALT): 37 U/L (ref 12–78)

## 2013-03-21 LAB — URINALYSIS, COMPLETE
Bacteria: NONE SEEN
Bilirubin,UR: NEGATIVE
Blood: NEGATIVE
Ketone: NEGATIVE
Leukocyte Esterase: NEGATIVE
Nitrite: NEGATIVE
Ph: 6 (ref 4.5–8.0)
RBC,UR: 1 /HPF (ref 0–5)
Squamous Epithelial: NONE SEEN
WBC UR: 4 /HPF (ref 0–5)

## 2013-03-21 LAB — SALICYLATE LEVEL: Salicylates, Serum: 3.1 mg/dL — ABNORMAL HIGH

## 2013-03-21 LAB — ACETAMINOPHEN LEVEL: Acetaminophen: <2 ug/mL

## 2013-03-21 LAB — ETHANOL
Ethanol %: <0.003 % (ref 0.000–0.080)
Ethanol: <3 mg/dL

## 2013-03-21 LAB — DRUG SCREEN, URINE
Barbiturates, Ur Screen: NEGATIVE (ref ?–200)
Benzodiazepine, Ur Scrn: POSITIVE (ref ?–200)
Cocaine Metabolite,Ur ~~LOC~~: NEGATIVE (ref ?–300)
MDMA (Ecstasy)Ur Screen: NEGATIVE (ref ?–500)
Methadone, Ur Screen: NEGATIVE (ref ?–300)
Opiate, Ur Screen: POSITIVE (ref ?–300)

## 2013-03-21 LAB — CBC
HCT: 36.8 % (ref 35.0–47.0)
HGB: 12.5 g/dL (ref 12.0–16.0)
MCH: 31.1 pg (ref 26.0–34.0)
MCHC: 34.1 g/dL (ref 32.0–36.0)
Platelet: 232 10*3/uL (ref 150–440)
RBC: 4.04 10*6/uL (ref 3.80–5.20)
RDW: 12.4 % (ref 11.5–14.5)

## 2013-11-07 ENCOUNTER — Emergency Department (HOSPITAL_COMMUNITY)
Admission: EM | Admit: 2013-11-07 | Discharge: 2013-11-07 | Disposition: A | Discharge status: Home / Self Care | Payer: Self-pay | Attending: Emergency Medicine | Admitting: Emergency Medicine

## 2013-11-07 ENCOUNTER — Encounter (HOSPITAL_COMMUNITY): Payer: Self-pay | Admitting: Emergency Medicine

## 2013-11-07 DIAGNOSIS — F17200 Nicotine dependence, unspecified, uncomplicated: Secondary | ICD-10-CM | POA: Insufficient documentation

## 2013-11-07 DIAGNOSIS — R011 Cardiac murmur, unspecified: Secondary | ICD-10-CM | POA: Insufficient documentation

## 2013-11-07 DIAGNOSIS — Z79899 Other long term (current) drug therapy: Secondary | ICD-10-CM | POA: Insufficient documentation

## 2013-11-07 DIAGNOSIS — L551 Sunburn of second degree: Secondary | ICD-10-CM | POA: Insufficient documentation

## 2013-11-07 DIAGNOSIS — R51 Headache: Secondary | ICD-10-CM | POA: Insufficient documentation

## 2013-11-07 MED ORDER — OXYCODONE-ACETAMINOPHEN 5-325 MG PO TABS
1.0000 | ORAL_TABLET | Freq: Once | ORAL | Status: AC
  Administered 2013-11-07: 1 via ORAL
  Filled 2013-11-07: qty 1

## 2013-11-07 MED ORDER — HYDROCODONE-ACETAMINOPHEN 5-325 MG PO TABS: 1.0000 | ORAL_TABLET | ORAL | Status: DC | PRN

## 2013-11-07 MED ORDER — PREDNISONE 20 MG PO TABS
40.0000 mg | ORAL_TABLET | Freq: Once | ORAL | Status: AC
  Administered 2013-11-07: 40 mg via ORAL
  Filled 2013-11-07: qty 2

## 2013-11-07 MED ORDER — PREDNISONE 10 MG PO TABS: 20.0000 mg | ORAL_TABLET | Freq: Two times a day (BID) | ORAL | Status: DC
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
## 2013-11-07 NOTE — ED Notes (Signed)
Pt verbalized understanding no driving within 4 hours of taking vicodin due to med causes drowsiness
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
## 2013-11-08 NOTE — ED Provider Notes (Signed)
  Medical screening examination/treatment/procedure(s) were performed by non-physician practitioner and as supervising physician I was immediately available for consultation/collaboration.   EKG Interpretation None         Carlee Vonderhaar, MD 11/08/13 0014 

## 2013-12-08 ENCOUNTER — Emergency Department (HOSPITAL_COMMUNITY): Payer: Self-pay

## 2013-12-08 ENCOUNTER — Emergency Department (HOSPITAL_COMMUNITY)
Admission: EM | Admit: 2013-12-08 | Discharge: 2013-12-08 | Disposition: A | Discharge status: Home / Self Care | Payer: Self-pay | Attending: Emergency Medicine | Admitting: Emergency Medicine

## 2013-12-08 ENCOUNTER — Encounter (HOSPITAL_COMMUNITY): Payer: Self-pay | Admitting: Emergency Medicine

## 2013-12-08 DIAGNOSIS — F17200 Nicotine dependence, unspecified, uncomplicated: Secondary | ICD-10-CM | POA: Insufficient documentation

## 2013-12-08 DIAGNOSIS — IMO0002 Reserved for concepts with insufficient information to code with codable children: Secondary | ICD-10-CM | POA: Insufficient documentation

## 2013-12-08 DIAGNOSIS — Z3202 Encounter for pregnancy test, result negative: Secondary | ICD-10-CM | POA: Insufficient documentation

## 2013-12-08 DIAGNOSIS — S20229A Contusion of unspecified back wall of thorax, initial encounter: Secondary | ICD-10-CM | POA: Insufficient documentation

## 2013-12-08 DIAGNOSIS — Z79899 Other long term (current) drug therapy: Secondary | ICD-10-CM | POA: Insufficient documentation

## 2013-12-08 DIAGNOSIS — Y929 Unspecified place or not applicable: Secondary | ICD-10-CM | POA: Insufficient documentation

## 2013-12-08 DIAGNOSIS — R011 Cardiac murmur, unspecified: Secondary | ICD-10-CM | POA: Insufficient documentation

## 2013-12-08 DIAGNOSIS — W19XXXA Unspecified fall, initial encounter: Secondary | ICD-10-CM

## 2013-12-08 DIAGNOSIS — Y9389 Activity, other specified: Secondary | ICD-10-CM | POA: Insufficient documentation

## 2013-12-08 DIAGNOSIS — W108XXA Fall (on) (from) other stairs and steps, initial encounter: Secondary | ICD-10-CM | POA: Insufficient documentation

## 2013-12-08 DIAGNOSIS — S139XXA Sprain of joints and ligaments of unspecified parts of neck, initial encounter: Secondary | ICD-10-CM | POA: Insufficient documentation

## 2013-12-08 DIAGNOSIS — S300XXA Contusion of lower back and pelvis, initial encounter: Secondary | ICD-10-CM

## 2013-12-08 LAB — POC URINE PREG, ED: Preg Test, Ur: NEGATIVE

## 2013-12-08 MED ORDER — METHOCARBAMOL 500 MG PO TABS: 500.0000 mg | ORAL_TABLET | Freq: Three times a day (TID) | ORAL | Status: DC

## 2013-12-08 MED ORDER — PROMETHAZINE HCL 25 MG PO TABS: 25.0000 mg | ORAL_TABLET | Freq: Four times a day (QID) | ORAL | Status: DC | PRN

## 2013-12-08 MED ORDER — CYCLOBENZAPRINE HCL 10 MG PO TABS
10.0000 mg | ORAL_TABLET | Freq: Once | ORAL | Status: AC
  Administered 2013-12-08: 10 mg via ORAL
  Filled 2013-12-08: qty 1

## 2013-12-08 MED ORDER — OXYCODONE-ACETAMINOPHEN 5-325 MG PO TABS: 1.0000 | ORAL_TABLET | ORAL | Status: DC | PRN

## 2013-12-08 MED ORDER — OXYCODONE-ACETAMINOPHEN 5-325 MG PO TABS
1.0000 | ORAL_TABLET | Freq: Once | ORAL | Status: AC
Start: 2013-12-08 — End: 2013-12-08
  Administered 2013-12-08: 1 via ORAL
  Filled 2013-12-08: qty 1

## 2013-12-08 MED ORDER — ONDANSETRON 8 MG PO TBDP
8.0000 mg | ORAL_TABLET | Freq: Once | ORAL | Status: DC
  Filled 2013-12-08: qty 1

## 2013-12-08 NOTE — ED Notes (Signed)
Patient refused zofran at this time. 
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
## 2013-12-08 NOTE — ED Provider Notes (Signed)
CSN: [id number]     Arrival date & time 12/08/13  1752 History   First MD Initiated Contact with Patient 12/08/13 1809     Chief Complaint  Patient presents with  . Fall     (Consider location/radiation/quality/duration/timing/severity/associated sxs/prior Treatment) HPI  patient c/o of pain along her entire spine and especially her lower back after a mechanical fall down several steps that occurred one day prior to ED arrival.  She c/o of severe pain to her neck and lower back that radiate into both hips and thighs.  She reports pain is worse with movement.  She states that she took "some pain medication" after the fall that did not help control her pain.  She denies LOC, vomiting, headache, head injury, numbness or weakness of her legs, abdominal pain, or incontinence of bladder or bowel.  When asked why she didn't come to the ED after the accident occurred, patient did not give a specific reason.    Past Medical History  Diagnosis Date  . Substance abuse     opiates, detox 05/2011  . Heart murmur   . Hx of nausea and vomiting    Past Surgical History  Procedure Laterality Date  . Back surgery      "tumor" removed from her back   History reviewed. No pertinent family history. History  Substance Use Topics  . Smoking status: Current Every Day Smoker -- 0.50 packs/day    Types: Cigarettes  . Smokeless tobacco: Not on file  . Alcohol Use: No     Comment: occ   OB History   Grav Para Term Preterm Abortions TAB SAB Ect Mult Living   Review of Systems  Constitutional: Negative for fever.  Respiratory: Negative for shortness of breath.   Cardiovascular: Negative for chest pain.  Gastrointestinal: Negative for vomiting, abdominal pain and constipation.  Genitourinary: Negative for dysuria, hematuria, flank pain, decreased urine volume and difficulty urinating.       No perineal numbness or incontinence of urine or feces  Musculoskeletal: Positive for back pain  and neck pain. Negative for joint swelling.  Skin: Negative for rash and wound.  Neurological: Negative for dizziness, syncope, weakness, numbness and headaches.  All other systems reviewed and are negative.     Allergies  Review of patient's allergies indicates no known allergies.  Home Medications   Prior to Admission medications   Medication Sig Start Date End Date Taking? Authorizing Provider  etonogestrel (IMPLANON) 68 MG IMPL implant Inject 1 each (68 mg total) into the skin once. Birth control method.  No prescription given !! 06/08/11   Sanjuana Kava, NP  HYDROcodone-acetaminophen (NORCO/VICODIN) 5-325 MG per tablet Take 1 tablet by mouth every 4 (four) hours as needed. 11/07/13   Hope Orlene Och, NP  nitrofurantoin (MACRODANTIN) 100 MG capsule Take 1 capsule (100 mg total) by mouth 4 (four) times daily. 08/29/12   Annamarie Dawley, MD  ondansetron (ZOFRAN ODT) 4 MG disintegrating tablet Take 1 tablet (4 mg total) by mouth every 8 (eight) hours as needed for nausea. 06/23/12   Vida Roller, MD  phenazopyridine (PYRIDIUM) 200 MG tablet Take 1 tablet (200 mg total) by mouth 3 (three) times daily. 08/29/12   Annamarie Dawley, MD  predniSONE (DELTASONE) 10 MG tablet Take 2 tablets (20 mg total) by mouth 2 (two) times daily with a meal. 11/07/13   Hope Orlene Och, NP  promethazine (  PHENERGAN) 25 MG suppository Place 1 suppository (25 mg total) rectally every 6 (six) hours as needed for nausea. 06/21/12   Ward Givens, MD  promethazine (PHENERGAN) 25 MG suppository Place 1 suppository (25 mg total) rectally every 6 (six) hours as needed for nausea. 06/23/12   Vida Roller, MD  promethazine (PHENERGAN) 25 MG tablet Take 25 mg by mouth every 6 (six) hours as needed for nausea.    Historical Provider, MD   BP 112/63  Pulse 96  Temp(Src) 98.6 F (37 C) (Oral)  Resp 18  Ht  (1.575 m)  Wt 112 lb (50.803 kg)  BMI 20.48 kg/m2  SpO2 100%  LMP 12/04/2013 Physical Exam  Nursing note and vitals  reviewed. Constitutional: She is oriented to person, place, and time. She appears well-developed and well-nourished. No distress.  HENT:  Head: Normocephalic and atraumatic. Head is without raccoon's eyes, without Battle's sign, without abrasion and without contusion.  Right Ear: Tympanic membrane and ear canal normal. No hemotympanum.  Left Ear: Tympanic membrane and ear canal normal. No hemotympanum.  Mouth/Throat: Uvula is midline, oropharynx is clear and moist and mucous membranes are normal.  No bony findings or hematomas of the scalp  Eyes: Conjunctivae and EOM are normal. Pupils are equal, round, and reactive to light.  Neck: Phonation normal. Neck supple. Spinous process tenderness and muscular tenderness present.  Cardiovascular: Normal rate, regular rhythm, normal heart sounds and intact distal pulses.   No murmur heard. Pulmonary/Chest: Effort normal and breath sounds normal. No respiratory distress. She exhibits no tenderness.  Abdominal: Soft. She exhibits no distension. There is no tenderness. There is no rebound and no guarding.  Musculoskeletal: She exhibits tenderness. She exhibits no edema.       Lumbar back: She exhibits tenderness and pain. She exhibits normal range of motion, no swelling, no deformity, no laceration and normal pulse.  Diffuse ttp of the entire spine and paraspinal muscles.  Pain to palpation to any area of the upper or lower back appears out of portion to exam findings.   DP pulses are brisk and symmetrical.  Distal sensation intact.  Hip Flexors/Extensors are intact.  Pt has neg SLR bilaterally.  Pt has 5/5 strength against resistance of bilateral lower extremities.     Neurological: She is alert and oriented to person, place, and time. She has normal strength. No sensory deficit. She exhibits normal muscle tone. Coordination and gait normal.  Reflex Scores:      Patellar reflexes are 2+ on the right side and 2+ on the left side.      Achilles reflexes are  2+ on the right side and 2+ on the left side. Skin: Skin is warm and dry. No rash noted.  No edema, bruising, abrasions, erythema finding on exam of the neck or pain.    Psychiatric:  Patient is sleepy on entering the room, but answers questions appropriately    ED Course  Procedures (including critical care time) Labs Review Labs Reviewed - No data to display  Imaging Review Dg Cervical Spine Complete  12/08/2013   CLINICAL DATA:  Back pain.  Fall.  EXAM: CERVICAL SPINE  4+ VIEWS  COMPARISON:  CT 06/06/2010  FINDINGS: There is no evidence of cervical spine fracture or prevertebral soft tissue swelling. Alignment is normal. No other significant bone abnormalities are identified.  IMPRESSION: Negative cervical spine radiographs.   Electronically Signed   By: Charlett Nose M.D.   On: 12/08/2013 19:43   Dg  Thoracic Spine 2 View  12/08/2013   CLINICAL DATA:  Pain  EXAM: THORACIC SPINE - 2 VIEW  COMPARISON:  08/21/2003  FINDINGS: There is no evidence of thoracic spine fracture. Alignment is normal. No other significant bone abnormalities are identified.  IMPRESSION: No acute abnormality noted.   Electronically Signed   By: Alcide Clever M.D.   On: 12/08/2013 19:43   Dg Lumbar Spine Complete  12/08/2013   CLINICAL DATA:  Back pain.  EXAM: LUMBAR SPINE - COMPLETE 4+ VIEW  COMPARISON:  Abdomen series 12/01/2011.  CT 01/28/2009.  FINDINGS: Soft tissue structures are unremarkable. Ring noted in the umbilicus. Normal bony alignment and mineralization. No acute bony abnormality identified.  IMPRESSION: No significant abnormality.   Electronically Signed   By: Maisie Fus  Register   On: 12/08/2013 19:44      EKG Interpretation None      MDM   Final diagnoses:  Lumbar contusion, initial encounter  Cervical sprain, initial encounter  Fall, initial encounter   Patient reviewed the [facility] narcotics database. Received #15 hydrocodone at the end of July.  Patient requesting pain medication upon  entering the exam room.  She requests oxycodone stating that she cannot take Tylenol due to having to take a medication, she cannot name, to "thicken her blood".  She states that she has received this medication every time she has been to the emergency room in the past and states that her primary doctor has told her not to take Tylenol for this reason.  Patient has had conflicting stories that were given to triage and to me. It was noted to me by nursing that when asked at triage specifically about head injuries, she states that she struck her head, but denies it to me.  She ambulated to the bathroom without difficulty, gait was steady. She complains of pain to her entire spinal area secondary to a fall down several steps but on exam there are no bruising abrasions, edema or evidence of trauma.    I have ordered Percocet for her pain, but she is specifically asking me for oxycodone tablets and demanding medication before going to x ray stating that the tylenol "will thin out my blood and make me sick".  I offered zofran  But she declined.  My clinical suspicion for bony injury or emergent neurological process is low.  Patient was seen here last month and was given rx for vicodin.  She took the percocet and I overheard the patient telling the transporter from XR that " that doctor wouldn't even give me anything for pain".    XR results discussed with the patient and she was advised to arrange close f/u with her PMD for recheck.  VSS.  She appears stable for discharge.     Milind Raether L. Trisha Mangle, PA-C 12/10/13 1844

## 2013-12-08 NOTE — Discharge Instructions (Signed)
Cervical Sprain A cervical sprain is when the tissues (ligaments) that hold the neck bones in place stretch or tear. HOME CARE   Put ice on the injured area.  Put ice in a plastic bag.  Place a towel between your skin and the bag.  Leave the ice on for 15-20 minutes, 3-4 times a day.  You may have been given a collar to wear. This collar keeps your neck from moving while you heal.  Do not take the collar off unless told by your doctor.  If you have long hair, keep it outside of the collar.  Ask your doctor before changing the position of your collar. You may need to change its position over time to make it more comfortable.  If you are allowed to take off the collar for cleaning or bathing, follow your doctor's instructions on how to do it safely.  Keep your collar clean by wiping it with mild soap and water. Dry it completely. If the collar has removable pads, remove them every 1-2 days to hand wash them with soap and water. Allow them to air dry. They should be dry before you wear them in the collar.  Do not drive while wearing the collar.  Only take medicine as told by your doctor.  Keep all doctor visits as told.  Keep all physical therapy visits as told.  Adjust your work station so that you have good posture while you work.  Avoid positions and activities that make your problems worse.  Warm up and stretch before being active. GET HELP IF:  Your pain is not controlled with medicine.  You cannot take less pain medicine over time as planned.  Your activity level does not improve as expected. GET HELP RIGHT AWAY IF:   You are bleeding.  Your stomach is upset.  You have an allergic reaction to your medicine.  You develop new problems that you cannot explain.  You lose feeling (become numb) or you cannot move any part of your body (paralysis).  You have tingling or weakness in any part of your body.  Your symptoms get worse. Symptoms include:  Pain,  soreness, stiffness, puffiness (swelling), or a burning feeling in your neck.  Pain when your neck is touched.  Shoulder or upper back pain.  Limited ability to move your neck.  Headache.  Dizziness.  Your hands or arms feel week, lose feeling, or tingle.  Muscle spasms.  Difficulty swallowing or chewing. MAKE SURE YOU:   Understand these instructions.  Will watch your condition.  Will get help right away if you are not doing well or get worse. Document Released: 09/16/2007 Document Revised: 11/30/2012 Document Reviewed: 10/05/2012 Exit[employer] Patient Information 2015 [employer]. This information is not intended to replace advice given to you by your health care provider. Make sure you discuss any questions you have with your health care provider.  

## 2013-12-08 NOTE — ED Notes (Signed)
Patient ambulated to the bathroom without assistance and tolerated well.

## 2013-12-09 ENCOUNTER — Encounter (HOSPITAL_COMMUNITY): Payer: Self-pay | Admitting: Emergency Medicine

## 2013-12-09 ENCOUNTER — Emergency Department (HOSPITAL_COMMUNITY): Payer: Self-pay

## 2013-12-09 ENCOUNTER — Emergency Department (HOSPITAL_COMMUNITY)
Admission: EM | Admit: 2013-12-09 | Discharge: 2013-12-09 | Disposition: A | Discharge status: Home / Self Care | Payer: Self-pay | Attending: Emergency Medicine | Admitting: Emergency Medicine

## 2013-12-09 DIAGNOSIS — S62609A Fracture of unspecified phalanx of unspecified finger, initial encounter for closed fracture: Secondary | ICD-10-CM

## 2013-12-09 DIAGNOSIS — S62309A Unspecified fracture of unspecified metacarpal bone, initial encounter for closed fracture: Secondary | ICD-10-CM | POA: Insufficient documentation

## 2013-12-09 DIAGNOSIS — R6521 Severe sepsis with septic shock: Secondary | ICD-10-CM

## 2013-12-09 DIAGNOSIS — W010XXA Fall on same level from slipping, tripping and stumbling without subsequent striking against object, initial encounter: Secondary | ICD-10-CM | POA: Insufficient documentation

## 2013-12-09 DIAGNOSIS — S6990XA Unspecified injury of unspecified wrist, hand and finger(s), initial encounter: Secondary | ICD-10-CM | POA: Insufficient documentation

## 2013-12-09 DIAGNOSIS — R000 Tachycardia, unspecified: Secondary | ICD-10-CM | POA: Insufficient documentation

## 2013-12-09 DIAGNOSIS — Y929 Unspecified place or not applicable: Secondary | ICD-10-CM | POA: Insufficient documentation

## 2013-12-09 DIAGNOSIS — Z79899 Other long term (current) drug therapy: Secondary | ICD-10-CM | POA: Insufficient documentation

## 2013-12-09 DIAGNOSIS — F17200 Nicotine dependence, unspecified, uncomplicated: Secondary | ICD-10-CM | POA: Insufficient documentation

## 2013-12-09 DIAGNOSIS — A419 Sepsis, unspecified organism: Secondary | ICD-10-CM | POA: Insufficient documentation

## 2013-12-09 DIAGNOSIS — Y9389 Activity, other specified: Secondary | ICD-10-CM | POA: Insufficient documentation

## 2013-12-09 MED ORDER — OXYCODONE-ACETAMINOPHEN 5-325 MG PO TABS
ORAL_TABLET | ORAL | Status: AC
  Filled 2013-12-09: qty 1

## 2013-12-09 MED ORDER — OXYCODONE-ACETAMINOPHEN 5-325 MG PO TABS
1.0000 | ORAL_TABLET | Freq: Once | ORAL | Status: AC
  Administered 2013-12-09: 1 via ORAL

## 2013-12-09 MED ORDER — OXYCODONE HCL 5 MG PO TABS: 5.0000 mg | ORAL_TABLET | ORAL | Status: DC | PRN

## 2013-12-09 NOTE — ED Provider Notes (Signed)
CSN: [id number]     Arrival date & time 12/09/13  1432 History   First MD Initiated Contact with Patient 12/09/13 1527     Chief Complaint  Patient presents with  . Hand Pain     (Consider location/radiation/quality/duration/timing/severity/associated sxs/prior Treatment) Patient is a 28 y.o. female presenting with hand injury. The history is provided by the patient.  Hand Injury Location:  Hand Time since incident:  12 hours Injury: yes   Mechanism of injury: fall   Fall:    Fall occurred:  Tripped   Impact surface:  Hard floor   Point of impact:  Hands   Entrapped after fall: no   Hand location:  L hand Pain details:    Quality:  Throbbing and sharp   Radiates to:  Does not radiate   Onset quality:  Sudden   Timing:  Constant   Progression:  Worsening Chronicity:  New Dislocation: no   Foreign body present:  No foreign bodies Prior injury to area:  No Relieved by:  Nothing Worsened by:  Movement Ineffective treatments:  Narcotics Associated symptoms: swelling    Nyashia KHERINGTON MERAZ is a 28 y.o. female who presents to the ED with pain to the left hand. She got up to go to the bathroom approximately 3am and fell. She reports swelling and bruising of the hand. She was here yesterday for low back pain and has been taking percocet during the night. She took more of the percocet today but the hand continues to hurt.   Past Medical History  Diagnosis Date  . Substance abuse     opiates, detox 05/2011  . Heart murmur   . Hx of nausea and vomiting    Past Surgical History  Procedure Laterality Date  . Back surgery      "tumor" removed from her back   History reviewed. No pertinent family history. History  Substance Use Topics  . Smoking status: Current Every Day Smoker -- 0.50 packs/day    Types: Cigarettes  . Smokeless tobacco: Not on file  . Alcohol Use: No   OB History   Grav Para Term Preterm Abortions TAB SAB Ect Mult Living   Review of  Systems Negative except as stated in HPI   Allergies  Review of patient's allergies indicates no known allergies.  Home Medications   Prior to Admission medications   Medication Sig Start Date End Date Taking? Authorizing Provider  methocarbamol (ROBAXIN) 500 MG tablet Take 1 tablet (500 mg total) by mouth 3 (three) times daily. 12/08/13   Tammy L. Triplett, PA-C  oxyCODONE-acetaminophen (PERCOCET/ROXICET) 5-325 MG per tablet Take 1 tablet by mouth every 4 (four) hours as needed. 12/08/13   Tammy L. Triplett, PA-C  promethazine (PHENERGAN) 25 MG tablet Take 1 tablet (25 mg total) by mouth every 6 (six) hours as needed for nausea or vomiting. 12/08/13   Tammy L. Triplett, PA-C   BP 108/65  Pulse 81  Temp(Src) 99.2 F (37.3 C) (Oral)  Resp 16  Ht  (1.575 m)  Wt 112 lb (50.803 kg)  BMI 20.48 kg/m2  SpO2 100%  LMP 11/27/2013 Physical Exam  Nursing note and vitals reviewed. Constitutional: She is oriented to person, place, and time. She appears well-developed and well-nourished. No distress.  HENT:  Head: Normocephalic and atraumatic.  Eyes: Conjunctivae and EOM are normal.  Neck: Neck supple.  Cardiovascular: Normal rate.   Pulmonary/Chest: Effort  normal.  Musculoskeletal:       Left hand: She exhibits tenderness, bony tenderness and swelling. She exhibits normal capillary refill, no deformity and no laceration. Normal sensation noted. Normal strength noted.  Ecchymosis and swelling to the dorsum of the left hand. Tender with palpation. Radial pulse strong, adequate circulation, good touch sensation.   Neurological: She is alert and oriented to person, place, and time. No cranial nerve deficit.  Skin: Skin is warm and dry.  Psychiatric: She has a normal mood and affect. Her behavior is normal.    ED Course  Procedures (including critical care time) Labs Review Labs Reviewed - No data to display  Imaging Review Dg Cervical Spine Complete  12/08/2013   CLINICAL DATA:   Back pain.  Fall.  EXAM: CERVICAL SPINE  4+ VIEWS  COMPARISON:  CT 06/06/2010  FINDINGS: There is no evidence of cervical spine fracture or prevertebral soft tissue swelling. Alignment is normal. No other significant bone abnormalities are identified.  IMPRESSION: Negative cervical spine radiographs.   Electronically Signed   By: Charlett Nose M.D.   On: 12/08/2013 19:43   Dg Thoracic Spine 2 View  12/08/2013   CLINICAL DATA:  Pain  EXAM: THORACIC SPINE - 2 VIEW  COMPARISON:  08/21/2003  FINDINGS: There is no evidence of thoracic spine fracture. Alignment is normal. No other significant bone abnormalities are identified.  IMPRESSION: No acute abnormality noted.   Electronically Signed   By: Alcide Clever M.D.   On: 12/08/2013 19:43   Dg Lumbar Spine Complete  12/08/2013   CLINICAL DATA:  Back pain.  EXAM: LUMBAR SPINE - COMPLETE 4+ VIEW  COMPARISON:  Abdomen series 12/01/2011.  CT 01/28/2009.  FINDINGS: Soft tissue structures are unremarkable. Ring noted in the umbilicus. Normal bony alignment and mineralization. No acute bony abnormality identified.  IMPRESSION: No significant abnormality.   Electronically Signed   By: Maisie Fus  Register   On: 12/08/2013 19:44   Dg Hand Complete Left  12/09/2013   CLINICAL DATA:  Dorsal hand pain, fell getting out of bed  EXAM: LEFT HAND - COMPLETE 3+ VIEW  COMPARISON:  None.  FINDINGS: Acute minimally displaced fracture through the base of the proximal phalanx of the index finger. Additionally, there is a focal lucency in the dorsal aspect of the head of the long finger metacarpal concerning for a focal impacted fracture. Associated soft tissue swelling dorsally overlying the MCP joint. No other acute fracture or abnormality is identified. Normal bony mineralization.  IMPRESSION: 1. Nondisplaced fracture through the base of the proximal phalanx of the index finger. 2. Small focal cleft like lucency in the dorsal aspect of the head of the long finger metacarpal concerning for  a focal cortical impact fracture.   Electronically Signed   By: Malachy Moan M.D.   On: 12/09/2013 15:20     MDM  28 y.o. female with pain, swelling and ecchymosis of the left hand s/p fall in the early am. Buddy tape of fingers, splint, ice, elevate and follow up with ortho. Stable for discharge without neurovascular deficits. Discussed with the patient importance of follow up. She voices understanding. Will treat for pain and inflammation.    Medication List    TAKE these medications       oxyCODONE 5 MG immediate release tablet  Commonly known as:  ROXICODONE  Take 1 tablet (5 mg total) by mouth every 4 (four) hours as needed for severe pain.      ASK your doctor about  these medications       methocarbamol 500 MG tablet  Commonly known as:  ROBAXIN  Take 1 tablet (500 mg total) by mouth 3 (three) times daily.     promethazine 25 MG tablet  Commonly known as:  PHENERGAN  Take 1 tablet (25 mg total) by mouth every 6 (six) hours as needed for nausea or vomiting.           [facility] Orlene Och, [place] 12/10/13 [phone]

## 2013-12-09 NOTE — ED Notes (Signed)
Patient with no complaints at this time. Respirations even and unlabored. Skin warm/dry. Discharge instructions reviewed with patient at this time. Patient given opportunity to voice concerns/ask questions. Patient discharged at this time and left Emergency Department with steady gait.   

## 2013-12-09 NOTE — Discharge Instructions (Signed)
Call Dr. Romeo Apple or Dr. Hilda Lias for follow up. Do not take the narcotic pain medication if you are driving as it will make you sleepy. You make take ibuprofen in addition to the narcotic.

## 2013-12-09 NOTE — ED Notes (Signed)
Pt reports left hand pain since last night after a fall. Pt reports hip "popping" out of place last night and caused pt to fall and landed on left hand. Moderate swelling noted to left hand.Pt reports increased pain with movement.

## 2013-12-10 ENCOUNTER — Encounter (HOSPITAL_COMMUNITY): Payer: Self-pay | Admitting: Emergency Medicine

## 2013-12-10 ENCOUNTER — Emergency Department (HOSPITAL_COMMUNITY)
Admission: EM | Admit: 2013-12-10 | Discharge: 2013-12-10 | Disposition: A | Discharge status: Home / Self Care | Payer: Self-pay | Attending: Emergency Medicine | Admitting: Emergency Medicine

## 2013-12-10 DIAGNOSIS — Z09 Encounter for follow-up examination after completed treatment for conditions other than malignant neoplasm: Secondary | ICD-10-CM

## 2013-12-10 DIAGNOSIS — S62609S Fracture of unspecified phalanx of unspecified finger, sequela: Secondary | ICD-10-CM

## 2013-12-10 DIAGNOSIS — R011 Cardiac murmur, unspecified: Secondary | ICD-10-CM | POA: Insufficient documentation

## 2013-12-10 DIAGNOSIS — M549 Dorsalgia, unspecified: Secondary | ICD-10-CM | POA: Insufficient documentation

## 2013-12-10 DIAGNOSIS — IMO0001 Reserved for inherently not codable concepts without codable children: Secondary | ICD-10-CM | POA: Insufficient documentation

## 2013-12-10 DIAGNOSIS — Z79899 Other long term (current) drug therapy: Secondary | ICD-10-CM | POA: Insufficient documentation

## 2013-12-10 DIAGNOSIS — F17200 Nicotine dependence, unspecified, uncomplicated: Secondary | ICD-10-CM | POA: Insufficient documentation

## 2013-12-10 DIAGNOSIS — Z9889 Other specified postprocedural states: Secondary | ICD-10-CM | POA: Insufficient documentation

## 2013-12-10 MED ORDER — OXYCODONE HCL 5 MG PO TABS
10.0000 mg | ORAL_TABLET | Freq: Once | ORAL | Status: AC
Start: 2013-12-10 — End: 2013-12-10
  Administered 2013-12-10: 10 mg via ORAL
  Filled 2013-12-10: qty 2

## 2013-12-10 MED ORDER — OXYCODONE HCL 10 MG PO TABS: 10.0000 mg | ORAL_TABLET | Freq: Four times a day (QID) | ORAL | Status: DC | PRN

## 2013-12-10 NOTE — Discharge Instructions (Signed)
We are giving you pain medication until you can follow up with Dr. Hilda Lias. Call the office tomorrow to be sure they have you on the schedule. Do not drive or do any activity while taking the narcotic as it will make you sleepy. If you do not have allergies to ibuprofen you should take that in addition to the narcotic for inflammation and pain. Continue to elevate the hand, apply ice and rest the area.

## 2013-12-10 NOTE — ED Notes (Signed)
Small area os fiberglass from splint poking pt in elbow area when are is flexed, tried to redo splint but pt refused states it was not bothering her.

## 2013-12-10 NOTE — ED Notes (Signed)
Pt seen for same yesterday. Pt reports continued left hand pain. Pt reports called Dr. Hilda Lias and has appointment for Friday. Pt reports pain px that was given yesterday is not working. Pt reports called Dr. Hilda Lias and asked about pain med. per pt Dr. Hilda Lias reported for pt to be seen in ED to see if something "a little" stronger could be px to get pt through until can f/u with him on Friday. nad noted. Finger splint that was applied yesterday remains intact and secured at time of triage.

## 2013-12-10 NOTE — ED Provider Notes (Signed)
Medical screening examination/treatment/procedure(s) were performed by non-physician practitioner and as supervising physician I was immediately available for consultation/collaboration.   EKG Interpretation None        Joya Gaskins, MD 12/10/13 2027

## 2013-12-10 NOTE — ED Provider Notes (Signed)
CSN: [id number]     Arrival date & time 12/10/13  1545 History   None   This chart was scribed for non-physician practitioner Ivery Quale, PA-C working with Joya Gaskins, MD, by Andrew Au, ED Scribe. This patient was seen in room APFT22/APFT22 and the patient's care was started at 4:59 PM. Chief Complaint  Patient presents with  . Hand Injury   Patient is a 28 y.o. female presenting with hand injury. The history is provided by the patient. No language interpreter was used.  Hand Injury Associated symptoms: back pain      Chelsea May is a 28 y.o. female who presents to the Emergency Department complaining of left hand pain. Pt was seen here yesterday for the same complaint after falling injuring her left 2nd and 3rd digit. Pt reports the prescribed medication of 5/325 percocet from back pain 2 days ago and  of oxycodone from yesterday has not relieved the pain. Pt states she took two 5 mg oxycodone with relief to pain.  She reports she has contacted Dr. Hilda Lias who told her to go to the  ED for more pain medication until she can be seen at his office. Pt has to call back tomorrow to make an appointment and can possibly be seen by Dr. Hilda Lias in 3 days. Pt is not driving.    Past Medical History  Diagnosis Date  . Substance abuse     opiates, detox 05/2011  . Heart murmur   . Hx of nausea and vomiting    Past Surgical History  Procedure Laterality Date  . Back surgery      "tumor" removed from her back   History reviewed. No pertinent family history. History  Substance Use Topics  . Smoking status: Current Every Day Smoker -- 0.50 packs/day    Types: Cigarettes  . Smokeless tobacco: Not on file  . Alcohol Use: No   OB History   Grav Para Term Preterm Abortions TAB SAB Ect Mult Living   Review of Systems  Musculoskeletal: Positive for back pain.       Left hand pain with swelling and brusing  All other systems negative Allergies  Review of  patient's allergies indicates no known allergies.  Home Medications   Prior to Admission medications   Medication Sig Start Date End Date Taking? Authorizing Provider  methocarbamol (ROBAXIN) 500 MG tablet Take 1 tablet (500 mg total) by mouth 3 (three) times daily. 12/08/13   Tammy L. Triplett, PA-C  oxyCODONE (ROXICODONE) 5 MG immediate release tablet Take 1 tablet (5 mg total) by mouth every 4 (four) hours as needed for severe pain. 12/09/13   Mandee Pluta Orlene Och, NP  promethazine (PHENERGAN) 25 MG tablet Take 1 tablet (25 mg total) by mouth every 6 (six) hours as needed for nausea or vomiting. 12/08/13   Tammy L. Triplett, PA-C   BP 131/80  Pulse 118  Temp(Src) 99.1 F (37.3 C) (Oral)  Resp 14  Ht  (1.575 m)  Wt 112 lb (50.803 kg)  BMI 20.48 kg/m2  SpO2 100%  LMP 11/27/2013 Physical Exam  Nursing note and vitals reviewed. Constitutional: She is oriented to person, place, and time. She appears well-developed and well-nourished. No distress.  HENT:  Head: Normocephalic and atraumatic.  Eyes: Conjunctivae and EOM are normal.  Neck: Neck supple.  Cardiovascular: Normal rate.   Pulmonary/Chest: Effort normal.  Musculoskeletal:  Left hand: She exhibits tenderness, bony tenderness and swelling. She exhibits normal capillary refill, no deformity and no laceration. Decreased range of motion: due to pain. Normal sensation noted.       Hands: Ecchymosis and tenderness to the left hand with palpation and range of motion. Radial pulse strong, adequate circulation.   Neurological: She is alert and oriented to person, place, and time.  Skin: Skin is warm and dry.  Psychiatric: She has a normal mood and affect. Her behavior is normal.    ED Course  Procedures (including critical care time) Labs Review Labs Reviewed - No data to display  Imaging Review Dg Cervical Spine Complete  12/08/2013   CLINICAL DATA:  Back pain.  Fall.  EXAM: CERVICAL SPINE  4+ VIEWS  COMPARISON:  CT 06/06/2010   FINDINGS: There is no evidence of cervical spine fracture or prevertebral soft tissue swelling. Alignment is normal. No other significant bone abnormalities are identified.  IMPRESSION: Negative cervical spine radiographs.   Electronically Signed   By: Charlett Nose M.D.   On: 12/08/2013 19:43   Dg Thoracic Spine 2 View  12/08/2013   CLINICAL DATA:  Pain  EXAM: THORACIC SPINE - 2 VIEW  COMPARISON:  08/21/2003  FINDINGS: There is no evidence of thoracic spine fracture. Alignment is normal. No other significant bone abnormalities are identified.  IMPRESSION: No acute abnormality noted.   Electronically Signed   By: Alcide Clever M.D.   On: 12/08/2013 19:43   Dg Lumbar Spine Complete  12/08/2013   CLINICAL DATA:  Back pain.  EXAM: LUMBAR SPINE - COMPLETE 4+ VIEW  COMPARISON:  Abdomen series 12/01/2011.  CT 01/28/2009.  FINDINGS: Soft tissue structures are unremarkable. Ring noted in the umbilicus. Normal bony alignment and mineralization. No acute bony abnormality identified.  IMPRESSION: No significant abnormality.   Electronically Signed   By: Maisie Fus  Register   On: 12/08/2013 19:44   Dg Hand Complete Left  12/09/2013   CLINICAL DATA:  Dorsal hand pain, fell getting out of bed  EXAM: LEFT HAND - COMPLETE 3+ VIEW  COMPARISON:  None.  FINDINGS: Acute minimally displaced fracture through the base of the proximal phalanx of the index finger. Additionally, there is a focal lucency in the dorsal aspect of the head of the long finger metacarpal concerning for a focal impacted fracture. Associated soft tissue swelling dorsally overlying the MCP joint. No other acute fracture or abnormality is identified. Normal bony mineralization.  IMPRESSION: 1. Nondisplaced fracture through the base of the proximal phalanx of the index finger. 2. Small focal cleft like lucency in the dorsal aspect of the head of the long finger metacarpal concerning for a focal cortical impact fracture.   Electronically Signed   By: Malachy Moan M.D.   On: 12/09/2013 15:20     MDM  28 y.o. female returns to the ED for pain management of her broken fingers after speaking with Dr. Hilda Lias and discussing her injury. Volar splint applied. Will treat for pain and she will follow up in his office. Stable for discharge and remains neurovascularly intact. She is to continue to elevate, ice, take ibuprofen and the pain medication and rest the area.   I discussed this case with Dr. Bebe Shaggy.   I personally performed the services described in this documentation, which was scribed in my presence. The recorded information has been reviewed and is accurate.      [facility] Orlene Och, [place] 12/10/13 [phone]

## 2013-12-10 NOTE — ED Provider Notes (Signed)
Medical screening examination/treatment/procedure(s) were performed by non-physician practitioner and as supervising physician I was immediately available for consultation/collaboration.   EKG Interpretation None        Keyra Virella L Ceferino Lang, MD 12/10/13 2223 

## 2013-12-10 NOTE — ED Provider Notes (Signed)
Medical screening examination/treatment/procedure(s) were performed by non-physician practitioner and as supervising physician I was immediately available for consultation/collaboration.   EKG Interpretation None        Onell Mcmath L Mariel Gaudin, MD 12/10/13 1739 

## 2013-12-12 ENCOUNTER — Telehealth: Payer: Self-pay | Admitting: Orthopedic Surgery

## 2013-12-12 NOTE — Telephone Encounter (Signed)
Patient called, both yesterday, 12/11/13, and today, 12/12/13, following Emergency Room visits at [facility] on 8/28, 8/29, 8/30; notes indicate that Dr Hilda Lias had advised patient and Emergency Room; patient stated yesterday that she would call Dr Sanjuan Dame office as was told by Emergency Room.  Today, 12/12/13, she called to inquire about appointment here.  I called back to patient - no answer, no voice message option.

## 2013-12-13 ENCOUNTER — Emergency Department (HOSPITAL_COMMUNITY)
Admission: EM | Admit: 2013-12-13 | Discharge: 2013-12-13 | Disposition: A | Discharge status: Home / Self Care | Payer: Self-pay | Attending: Emergency Medicine | Admitting: Emergency Medicine

## 2013-12-13 ENCOUNTER — Encounter (HOSPITAL_COMMUNITY): Payer: Self-pay | Admitting: Emergency Medicine

## 2013-12-13 DIAGNOSIS — Z760 Encounter for issue of repeat prescription: Secondary | ICD-10-CM | POA: Insufficient documentation

## 2013-12-13 DIAGNOSIS — IMO0001 Reserved for inherently not codable concepts without codable children: Secondary | ICD-10-CM | POA: Insufficient documentation

## 2013-12-13 DIAGNOSIS — S62609D Fracture of unspecified phalanx of unspecified finger, subsequent encounter for fracture with routine healing: Secondary | ICD-10-CM

## 2013-12-13 DIAGNOSIS — Z791 Long term (current) use of non-steroidal anti-inflammatories (NSAID): Secondary | ICD-10-CM | POA: Insufficient documentation

## 2013-12-13 DIAGNOSIS — Z4689 Encounter for fitting and adjustment of other specified devices: Secondary | ICD-10-CM | POA: Insufficient documentation

## 2013-12-13 DIAGNOSIS — R011 Cardiac murmur, unspecified: Secondary | ICD-10-CM | POA: Insufficient documentation

## 2013-12-13 DIAGNOSIS — Z4789 Encounter for other orthopedic aftercare: Secondary | ICD-10-CM

## 2013-12-13 DIAGNOSIS — F17200 Nicotine dependence, unspecified, uncomplicated: Secondary | ICD-10-CM | POA: Insufficient documentation

## 2013-12-13 MED ORDER — IBUPROFEN 800 MG PO TABS
800.0000 mg | ORAL_TABLET | Freq: Once | ORAL | Status: AC
  Administered 2013-12-13: 800 mg via ORAL
  Filled 2013-12-13: qty 1

## 2013-12-13 MED ORDER — OXYCODONE HCL 5 MG PO TABS: 10.0000 mg | ORAL_TABLET | ORAL | Status: DC | PRN

## 2013-12-13 MED ORDER — OXYCODONE-ACETAMINOPHEN 5-325 MG PO TABS: 2.0000 | ORAL_TABLET | Freq: Once | ORAL | Status: DC

## 2013-12-13 MED ORDER — OXYCODONE HCL 5 MG PO TABS
10.0000 mg | ORAL_TABLET | Freq: Once | ORAL | Status: AC
  Administered 2013-12-13: 10 mg via ORAL
  Filled 2013-12-13: qty 2

## 2013-12-13 NOTE — ED Notes (Signed)
Pt alert & oriented x4, stable gait. Patient  given discharge instructions, paperwork & prescription(s). Patient  instructed to stop at the registration desk to finish any additional paperwork. Patient  verbalized understanding. Pt left department complaining about not getting enough pain medication to make it through tomorrow. Pt states she is going to [facility] from now on.

## 2013-12-13 NOTE — ED Notes (Signed)
Pt here for left hand pain for the 3rd time.

## 2013-12-13 NOTE — Discharge Instructions (Signed)
Cast Care The purpose of a cast is to protect and immobilize an injured part of the body. This may be necessary after fractures, surgery, or other injuries. Splints are another form of immobilization; however, they are usually not as rigid as a cast, which accommodates swelling of the injury while still maintaining immobilization. Splints are typically used in the immediate post injury or postoperative period, before changing to a cast.  Before the rigid material of a cast is formed, a layer of padding is first applied to protect the injury. The rigid portion of a cast is created by wrapping gauze saturated with plaster of [place] around the injury; alternatively, the cast may be made of fiberglass. During the period of immobilization, a cast may need to be changed multiple times. The length of immobilization is dependent on the severity of the injury and the time needed for healing. This period can vary from a couple weeks to many months. After a cast is created, radiographs (X-rays) through the cast determine if a satisfactory alignment of the bones was achieved. Radiographs may also be used throughout the healing period to check for signs of bone healing.  CARE OF THE CAST   Allow the cast to dry and harden completely before applying any pressure to it.  Applying pressure too early may create a point of excessive pressure on the skin, which may increase the risk of forming an ulcer. Drying time depends on the type of cast but may last as long as 24 hours.  When a cast gets wet, a soft area may appear. If this happens accidentally, return to the doctor's office, emergency room, or outpatient surgical facility as soon as possible for repairs or changing of the cast.  Do not get sand in the cast.  Do not place anything inside the cast. This includes items intended to scratch an area of skin that itches. ITCHING INSIDE A CAST   It is very common for a person with a cast to experience an itching  sensation under the cast.  Do not scratch any area under the cast even if the area is within reach. The skin under a cast in an environment of increased risk for injury.  Do not put anything in the cast.  If there is no wound, such as an incision from surgery, you may sprinkle cornstarch into the cast to relieve itching.  If a wound is present under the cast, consult your caregiver for pain medication or medication to reduce itching.  Using a hairdryer (on the cold setting) is a useful technique for reducing an itching sensation. CARE OF THE PATIENT IN A CAST It is important to elevate the body part that is in a cast to a level equal to or above that of the heart whenever possible. Elevating the injured body part may reduce the likelihood of swelling. Elevation of a leg in a cast may be achieved by resting the leg on a pillow when in bed and on a footstool or chair when sitting. For an arm in a cast, rest the arm on a pillow placed on the chest.  No matter how well one follows the necessary precautions, excessive swelling may occur under the cast. Signs and symptoms of excessive swelling include:  Severe and persistent pain.  Change in color of the tissues beyond the end of the cast, such as a change to blue or gray under the nails of the fingers or toes.  Coldness of the tissues beyond the cast when  the rest of the body is warm.  Numbness or complete loss of feeling in the skin beyond the cast.  Feeling of tightness under the cast after it dries.  Swelling of the tissue to a greater extent than was present before the cast was applied.  For a leg cast, inability to raise the big toe. If any of these signs or symptoms occur, contact your caregiver or an emergency room as soon as possible for treatment.  Infection Inside a Cast On occasion, an injury may become infected during healing. The most important way to fight an infection is to detect it early; however, early detection may be  difficult if the infected area is covered by a cast. Infection should be reported immediately to your caregiver. The following are common signs and symptoms of infection:   Foul smell.  Fever greater than 101F (38.3C) (may be accompanied by a general ill feeling).  Leakage of fluid through the cast.  Increasing pain or soreness of the skin under the cast. Bathing with a Cast Bathing is often a difficult task with a cast. The cast must be kept dry at all times, unless otherwise specified by your caregiver. If the cast is on a limb, such as your arm or leg, it is often easier to take a bath with the extremity in a cast propped up on the side of the tub or a chair, out of the water. If the cast is on the trunk of the body, you should take sponge baths until the cast is removed.  Document Released: 03/30/2005 Document Revised: 08/14/2013 Document Reviewed: 07/12/2008 [facility] Patient Information 2015 [place], [place]. This information is not intended to replace advice given to you by your health care provider. Make sure you discuss any questions you have with your health care provider.   You may take the oxycodone prescribed for pain relief.  This will make you drowsy - do not drive within 4 hours of taking this medication.

## 2013-12-13 NOTE — ED Notes (Addendum)
Patient has cast on left upper extremity and states she is unable to get into Dr Sanjuan Dame office until next week. States she is out of pain medication and called Dr Hilda Lias and "he told me to go to the ER and tell them to give me enough pain medicine until I can get in to see him." Patient also complaining of cast irritating her side when it rubs it. States "they gave me 15 pills on the 30th and I have been out since yesterday."

## 2013-12-15 NOTE — ED Provider Notes (Signed)
CSN: [id number]     Arrival date & time 12/13/13  1929 History   First MD Initiated Contact with Patient 12/13/13 2138     Chief Complaint  Patient presents with  . Medication Refill     (Consider location/radiation/quality/duration/timing/severity/associated sxs/prior Treatment) The history is provided by the patient.   Chelsea May is a 28 y.o. female originally seen here on 8/28 due to mechanical fall, diagnosed with left hand index finger phalanx fracture and long finger metacarpal fracture,  presenting with complaints of persistent hand pain which she believes is worsened by the splint.  Some of the padding as fallen out,  Allowing her fingers ROM and is not supporting the injured bones.  Additionally, states the splint is too long, cutting into her chest when she tries to rest the extremity.  She has been trying to get in with orthopedics for further evaluation and treatment.  She states it will be next week before Dr. Hilda Lias can see her.  She has also called Dr. Diamantina Providence office but has not heard a response yet.  She is out of her pain medicine.  She was prescribed #15 percocet 3 days ago and has run out of this medicine  She denies numbness in her fingertips.    Past Medical History  Diagnosis Date  . Substance abuse     opiates, detox 05/2011  . Heart murmur   . Hx of nausea and vomiting    Past Surgical History  Procedure Laterality Date  . Back surgery      "tumor" removed from her back   History reviewed. No pertinent family history. History  Substance Use Topics  . Smoking status: Current Every Day Smoker -- 0.50 packs/day    Types: Cigarettes  . Smokeless tobacco: Not on file  . Alcohol Use: No   OB History   Grav Para Term Preterm Abortions TAB SAB Ect Mult Living   Review of Systems  Constitutional: Negative for fever.  Musculoskeletal: Positive for arthralgias. Negative for joint swelling and myalgias.  Skin: Positive for color  change.  Neurological: Negative for weakness and numbness.      Allergies  Hydrocodone  Home Medications   Prior to Admission medications   Medication Sig Start Date End Date Taking? Authorizing Provider  naproxen sodium (ALEVE) 220 MG tablet Take 220 mg by mouth 2 (two) times daily as needed (pain).   Yes Historical Provider, MD  Oxycodone HCl 10 MG TABS Take 1 tablet (10 mg total) by mouth every 6 (six) hours as needed. 12/10/13  Yes Hope Orlene Och, NP  promethazine (PHENERGAN) 25 MG tablet Take 1 tablet (25 mg total) by mouth every 6 (six) hours as needed for nausea or vomiting. 12/08/13  Yes Tammy L. Triplett, PA-C  oxyCODONE (ROXICODONE) 5 MG immediate release tablet Take 2 tablets (10 mg total) by mouth every 4 (four) hours as needed for severe pain. 12/13/13   Burgess Amor, PA-C   BP 102/70  Pulse 111  Temp(Src) 99.1 F (37.3 C) (Oral)  Resp 24  Ht  (1.575 m)  Wt 112 lb (50.803 kg)  BMI 20.48 kg/m2  SpO2 100%  LMP 11/27/2013 Physical Exam  Constitutional: She appears well-developed and well-nourished.  HENT:  Head: Atraumatic.  Neck: Normal range of motion.  Cardiovascular:  Pulses equal bilaterally  Musculoskeletal: She exhibits edema and tenderness.  Mild edema,  Ecchymosis dorsal and volar left  hand.  Less than 3 sec cap refill.  Pt can flex/ext fingers with discomfort.  Skin intact.  Neurological: She is alert. She has normal strength. She displays normal reflexes. No sensory deficit.  Skin: Skin is warm and dry.  Psychiatric: She has a normal mood and affect.    ED Course  Procedures (including critical care time) Labs Review Labs Reviewed - No data to display  Imaging Review No results found.   EKG Interpretation None      MDM   Final diagnoses:  Aftercare for cast or splint check or change  Multiple fractures of fingers, with routine healing, subsequent encounter    Pt presents with long arm cast, extending beyond the elbow with the end flaring  toward her torso.  This was replaced with a radial gutter short splint extending just beyond the wrist joint.  Distal sensation, cap refill normal after splint placed.  She was given #2 percocet while here.  Review of chart with patient stating on 8/30 that she has appt with Dr. Hilda Lias on Friday (2 days from now).  Pt now denies this statement.  She does have a history of substance abuse, I suspect she is using this injury to obtain narcotics.  She has received #21 percocet 5 mg and #10 percocet 10 mg tabs since 8/28.  She was given #6 tabs today, as she does have documented fracture, advised further pain medications for this injury will need to be provided by her orthopedist.  Pt was not happy at time of dispo.    The patient appears reasonably screened and/or stabilized for discharge and I doubt any other medical condition or other [facility] requiring further screening, evaluation, or treatment in the ED at this time prior to discharge.    Burgess Amor, PA-C 12/15/13 1456

## 2013-12-17 NOTE — ED Provider Notes (Signed)
Medical screening examination/treatment/procedure(s) were performed by non-physician practitioner and as supervising physician I was immediately available for consultation/collaboration.   EKG Interpretation None        Andrian Urbach, MD 12/17/13 1815 

## 2013-12-19 NOTE — Telephone Encounter (Signed)
12/19/13 Call from patient today - I had called her back as noted on 12/12/13 and no answer machine or voice message option available.  She states Dr Hilda Lias was going to put a cast on, however, she needed to pay more money.  I advised that Dr Hilda Lias has started treatment; therefore, she is best to follow through with the care provided by Dr Hilda Lias; I relayed that our office would also require payment to be made if self-pay.  She hung up on me.

## 2013-12-31 ENCOUNTER — Emergency Department (HOSPITAL_COMMUNITY): Payer: Self-pay

## 2013-12-31 ENCOUNTER — Inpatient Hospital Stay (HOSPITAL_COMMUNITY)
Admission: EM | Admit: 2013-12-31 | Discharge: 2014-01-03 | DRG: 558 | Disposition: A | Discharge status: Home / Self Care | Payer: Self-pay | Attending: Internal Medicine | Admitting: Internal Medicine

## 2013-12-31 ENCOUNTER — Encounter (HOSPITAL_COMMUNITY): Payer: Self-pay | Admitting: Emergency Medicine

## 2013-12-31 DIAGNOSIS — M6289 Other specified disorders of muscle: Secondary | ICD-10-CM | POA: Diagnosis present

## 2013-12-31 DIAGNOSIS — M609 Myositis, unspecified: Secondary | ICD-10-CM | POA: Diagnosis present

## 2013-12-31 DIAGNOSIS — M25559 Pain in unspecified hip: Secondary | ICD-10-CM

## 2013-12-31 DIAGNOSIS — F17200 Nicotine dependence, unspecified, uncomplicated: Secondary | ICD-10-CM | POA: Diagnosis present

## 2013-12-31 DIAGNOSIS — R3129 Other microscopic hematuria: Secondary | ICD-10-CM | POA: Diagnosis present

## 2013-12-31 DIAGNOSIS — M79609 Pain in unspecified limb: Secondary | ICD-10-CM

## 2013-12-31 DIAGNOSIS — S6292XA Unspecified fracture of left wrist and hand, initial encounter for closed fracture: Secondary | ICD-10-CM

## 2013-12-31 DIAGNOSIS — N390 Urinary tract infection, site not specified: Secondary | ICD-10-CM | POA: Diagnosis present

## 2013-12-31 DIAGNOSIS — D649 Anemia, unspecified: Secondary | ICD-10-CM | POA: Diagnosis present

## 2013-12-31 DIAGNOSIS — M79632 Pain in left forearm: Secondary | ICD-10-CM

## 2013-12-31 DIAGNOSIS — M25552 Pain in left hip: Secondary | ICD-10-CM

## 2013-12-31 DIAGNOSIS — M6282 Rhabdomyolysis: Principal | ICD-10-CM | POA: Diagnosis present

## 2013-12-31 DIAGNOSIS — E871 Hypo-osmolality and hyponatremia: Secondary | ICD-10-CM | POA: Diagnosis present

## 2013-12-31 DIAGNOSIS — IMO0001 Reserved for inherently not codable concepts without codable children: Secondary | ICD-10-CM

## 2013-12-31 DIAGNOSIS — F1910 Other psychoactive substance abuse, uncomplicated: Secondary | ICD-10-CM

## 2013-12-31 DIAGNOSIS — T796XXA Traumatic ischemia of muscle, initial encounter: Secondary | ICD-10-CM

## 2013-12-31 DIAGNOSIS — R52 Pain, unspecified: Secondary | ICD-10-CM | POA: Diagnosis present

## 2013-12-31 DIAGNOSIS — W19XXXA Unspecified fall, initial encounter: Secondary | ICD-10-CM | POA: Diagnosis present

## 2013-12-31 DIAGNOSIS — F1110 Opioid abuse, uncomplicated: Secondary | ICD-10-CM

## 2013-12-31 DIAGNOSIS — W06XXXA Fall from bed, initial encounter: Secondary | ICD-10-CM | POA: Diagnosis present

## 2013-12-31 HISTORY — DX: Hematuria, unspecified: R31.9

## 2013-12-31 HISTORY — DX: Gangrene, not elsewhere classified: I96

## 2013-12-31 HISTORY — DX: Rhabdomyolysis: M62.82

## 2013-12-31 HISTORY — DX: Other specified disorders of muscle: M62.89

## 2013-12-31 LAB — URINALYSIS, ROUTINE W REFLEX MICROSCOPIC
BILIRUBIN URINE: NEGATIVE
GLUCOSE, UA: NEGATIVE mg/dL
Ketones, ur: NEGATIVE mg/dL
Nitrite: NEGATIVE
Protein, ur: NEGATIVE mg/dL
SPECIFIC GRAVITY, URINE: 1.015 (ref 1.005–1.030)
Urobilinogen, UA: 0.2 mg/dL (ref 0.0–1.0)
pH: 6 (ref 5.0–8.0)

## 2013-12-31 LAB — CBC WITH DIFFERENTIAL/PLATELET
BASOS ABS: 0 10*3/uL (ref 0.0–0.1)
Basophils Relative: 0 % (ref 0–1)
Eosinophils Absolute: 0.2 10*3/uL (ref 0.0–0.7)
Eosinophils Relative: 2 % (ref 0–5)
HCT: 37 % (ref 36.0–46.0)
Hemoglobin: 12.7 g/dL (ref 12.0–15.0)
Lymphocytes Relative: 28 % (ref 12–46)
Lymphs Abs: 2.6 10*3/uL (ref 0.7–4.0)
MCH: 31.4 pg (ref 26.0–34.0)
MCHC: 34.3 g/dL (ref 30.0–36.0)
MCV: 91.6 fL (ref 78.0–100.0)
Monocytes Absolute: 0.9 10*3/uL (ref 0.1–1.0)
Monocytes Relative: 10 % (ref 3–12)
NEUTROS PCT: 60 % (ref 43–77)
Neutro Abs: 5.8 10*3/uL (ref 1.7–7.7)
Platelets: 239 10*3/uL (ref 150–400)
RBC: 4.04 MIL/uL (ref 3.87–5.11)
RDW: 12.7 % (ref 11.5–15.5)
WBC: 9.5 10*3/uL (ref 4.0–10.5)

## 2013-12-31 LAB — URINE MICROSCOPIC-ADD ON

## 2013-12-31 LAB — CK: CK TOTAL: 20331 U/L — AB (ref 7–177)

## 2013-12-31 LAB — BASIC METABOLIC PANEL
ANION GAP: 8 (ref 5–15)
BUN: 13 mg/dL (ref 6–23)
CALCIUM: 8.6 mg/dL (ref 8.4–10.5)
CO2: 26 meq/L (ref 19–32)
Chloride: 99 mEq/L (ref 96–112)
Creatinine, Ser: 0.68 mg/dL (ref 0.50–1.10)
GFR calc Af Amer: >90 mL/min (ref >90)
GFR calc non Af Amer: >90 mL/min (ref >90)
Glucose, Bld: 112 mg/dL — ABNORMAL HIGH (ref 70–99)
POTASSIUM: 4.1 meq/L (ref 3.7–5.3)
SODIUM: 133 meq/L — AB (ref 137–147)

## 2013-12-31 LAB — POC URINE PREG, ED: Preg Test, Ur: NEGATIVE

## 2013-12-31 LAB — TSH: TSH: 1.72 u[IU]/mL (ref 0.350–4.500)

## 2013-12-31 LAB — SEDIMENTATION RATE: SED RATE: 6 mm/h (ref 0–22)

## 2013-12-31 MED ORDER — NAPROXEN 375 MG PO TABS: 375.0000 mg | ORAL_TABLET | Freq: Two times a day (BID) | ORAL | Status: DC

## 2013-12-31 MED ORDER — FENTANYL CITRATE 0.05 MG/ML IJ SOLN
50.0000 ug | INTRAMUSCULAR | Status: DC | PRN
  Administered 2013-12-31 (×5): 50 ug via INTRAVENOUS
  Filled 2013-12-31 (×5): qty 2

## 2013-12-31 MED ORDER — NICOTINE 21 MG/24HR TD PT24
21.0000 mg | MEDICATED_PATCH | Freq: Every day | TRANSDERMAL | Status: DC
  Administered 2013-12-31 – 2014-01-03 (×4): 21 mg via TRANSDERMAL
  Filled 2013-12-31 (×4): qty 1

## 2013-12-31 MED ORDER — HYDROMORPHONE HCL 1 MG/ML IJ SOLN: 0.5000 mg | INTRAMUSCULAR | Status: DC | PRN

## 2013-12-31 MED ORDER — ONDANSETRON HCL 4 MG/2ML IJ SOLN: 4.0000 mg | Freq: Four times a day (QID) | INTRAMUSCULAR | Status: DC | PRN

## 2013-12-31 MED ORDER — MORPHINE SULFATE 4 MG/ML IJ SOLN
6.0000 mg | Freq: Once | INTRAMUSCULAR | Status: AC
  Administered 2013-12-31: 6 mg via INTRAMUSCULAR
  Filled 2013-12-31: qty 2

## 2013-12-31 MED ORDER — OXYCODONE HCL 5 MG PO TABS
5.0000 mg | ORAL_TABLET | ORAL | Status: DC | PRN
  Administered 2013-12-31 – 2014-01-01 (×4): 5 mg via ORAL
  Filled 2013-12-31 (×4): qty 1

## 2013-12-31 MED ORDER — FAMOTIDINE 20 MG PO TABS
20.0000 mg | ORAL_TABLET | Freq: Two times a day (BID) | ORAL | Status: DC
  Administered 2013-12-31 – 2014-01-03 (×6): 20 mg via ORAL
  Filled 2013-12-31 (×7): qty 1

## 2013-12-31 MED ORDER — FENTANYL CITRATE 0.05 MG/ML IJ SOLN
75.0000 ug | Freq: Once | INTRAMUSCULAR | Status: AC
  Administered 2013-12-31: 75 ug via INTRAVENOUS
  Filled 2013-12-31: qty 2

## 2013-12-31 MED ORDER — HYDROMORPHONE HCL 1 MG/ML IJ SOLN
1.0000 mg | INTRAMUSCULAR | Status: DC | PRN
  Administered 2013-12-31 – 2014-01-02 (×20): 1 mg via INTRAVENOUS
  Filled 2013-12-31 (×21): qty 1

## 2013-12-31 MED ORDER — KETOROLAC TROMETHAMINE 30 MG/ML IJ SOLN
30.0000 mg | Freq: Once | INTRAMUSCULAR | Status: AC
  Administered 2013-12-31: 30 mg via INTRAVENOUS
  Filled 2013-12-31: qty 1

## 2013-12-31 MED ORDER — ONDANSETRON HCL 4 MG PO TABS: 4.0000 mg | ORAL_TABLET | Freq: Four times a day (QID) | ORAL | Status: DC | PRN

## 2013-12-31 MED ORDER — SODIUM CHLORIDE 0.9 % IV SOLN
INTRAVENOUS | Status: DC
  Administered 2013-12-31 – 2014-01-02 (×7): via INTRAVENOUS

## 2013-12-31 MED ORDER — ONDANSETRON HCL 4 MG/2ML IJ SOLN: 4.0000 mg | Freq: Three times a day (TID) | INTRAMUSCULAR | Status: DC | PRN

## 2013-12-31 MED ORDER — ACETAMINOPHEN 325 MG PO TABS: 650.0000 mg | ORAL_TABLET | Freq: Four times a day (QID) | ORAL | Status: DC | PRN

## 2013-12-31 MED ORDER — ACETAMINOPHEN 650 MG RE SUPP
650.0000 mg | Freq: Four times a day (QID) | RECTAL | Status: DC | PRN
Start: 2013-12-31 — End: 2014-01-03

## 2013-12-31 MED ORDER — SODIUM CHLORIDE 0.9 % IV BOLUS (SEPSIS)
1000.0000 mL | Freq: Once | INTRAVENOUS | Status: AC
  Administered 2013-12-31: 1000 mL via INTRAVENOUS

## 2013-12-31 MED ORDER — KETOROLAC TROMETHAMINE 30 MG/ML IJ SOLN
15.0000 mg | Freq: Once | INTRAMUSCULAR | Status: AC
  Administered 2013-12-31: 15 mg via INTRAVENOUS
  Filled 2013-12-31: qty 1

## 2013-12-31 MED ORDER — ENOXAPARIN SODIUM 40 MG/0.4ML ~~LOC~~ SOLN
40.0000 mg | SUBCUTANEOUS | Status: DC
  Administered 2013-12-31: 40 mg via SUBCUTANEOUS
  Filled 2013-12-31: qty 0.4

## 2013-12-31 MED ORDER — GADOBENATE DIMEGLUMINE 529 MG/ML IV SOLN
10.0000 mL | Freq: Once | INTRAVENOUS | Status: AC | PRN
  Administered 2013-12-31: 10 mL via INTRAVENOUS

## 2013-12-31 NOTE — ED Notes (Signed)
Pt up ambulating to restroom with assistance.

## 2013-12-31 NOTE — H&P (Signed)
[facility] History and Physical  Chelsea May ZOX:[id number] DOB: 1986-01-27 DOA: 12/31/2013  Referring physician: Dr. Jodi Mourning PCP: No PCP Per Patient   Chief Complaint: pain in left leg  HPI: Chelsea May is a 28 y.o. female with no reported past medical history who reports taking an over-the-counter sleep aid last night. She reports that one tablet. At some time overnight, the patient rolled out of bed and fell to the ground. She reports that her bed is significantly elevated with bedframe, box spring, and 2 mattresses. During her fall, she reports hitting her left thigh on a stool which she uses to clinic today in her left arm hit a night table. The patient got back into bed, but within a few hours old contusion having significant pain in her left thigh and left arm. She noticed swelling. She denies any fever, erythema. She did not have any other problems prior to her fall. She was evaluated in the emergency room where x-rays were unrevealing for any underlying fractures. MRI of her pelvis to indicate significant myositis with areas of myonecrosis in her gluteal region. Case was discussed by Dr. Jodi Mourning with Dr. Sherlean Foot on-call for orthopedics at [facility]. Images were reviewed and history was discussed. recommendations were for patient to be admitted to the hospital for supportive therapy. Antibiotics are not recommended at this time since this does not appear to be an infectious problem. Further IV hydration and pain management was recommended with formal orthopedic consult in a.m.    Review of Systems:  Pertinent positives as per HPI, otherwise negative  Past Medical History  Diagnosis Date  . Substance abuse     opiates, detox 05/2011  . Heart murmur   . Hx of nausea and vomiting    Past Surgical History  Procedure Laterality Date  . Back surgery      "tumor" removed from her back   Social History:  reports that she has been smoking Cigarettes.  She has been smoking  about 0.50 packs per day. She does not have any smokeless tobacco history on file. She reports that she does not drink alcohol or use illicit drugs.  Allergies  Allergen Reactions  . Hydrocodone Nausea And Vomiting    Patient states that she gets a metal taste in her mouth.    Family history: maternal aunt and cousin have lupus.   Prior to Admission medications   Medication Sig Start Date End Date Taking? Authorizing Provider  megestrol (MEGACE) 20 MG tablet Take 60 mg by mouth 3 (three) times daily.   Yes Historical Provider, MD  ranitidine (ZANTAC) 150 MG tablet Take 150 mg by mouth daily.   Yes Historical Provider, MD  naproxen (NAPROSYN) 375 MG tablet Take 1 tablet (375 mg total) by mouth 2 (two) times daily. 12/31/13   Enid Skeens, MD   Physical Exam: Filed Vitals:   12/31/13 0741 12/31/13 1018 12/31/13 1352  BP: 125/75 110/60 93/50  Pulse: 125 107 95  Temp: 98.7 F (37.1 C)    TempSrc: Oral    Resp: Height:  (1.575 m)    Weight: 47.628 kg (105 lb)    SpO2: 100% 100% 99%    Wt Readings from Last 3 Encounters:  12/31/13 47.628 kg (105 lb)  12/13/13 50.803 kg (112 lb)  12/10/13 50.803 kg (112 lb)    General:  Appears calm and comfortable Eyes: PERRL, normal lids, irises & conjunctiva ENT: grossly normal hearing, lips & tongue  Neck: no LAD, masses or thyromegaly Cardiovascular: RRR, no m/r/g. No LE edema. Telemetry: SR, no arrhythmias  Respiratory: CTA bilaterally, no w/r/r. Normal respiratory effort. Abdomen: soft, ntnd Skin: no rash or induration seen on limited exam Musculoskeletal: tenderness to palpation and warmth over left gluteal region, extending down left leg, pulses intact bilaterally in upper and lower extremities, neurovascularly intact, no erythema observed, no bruising. Edema also noted over left forearm without any erythema, tender to palpation. Pain worse with ROM Psychiatric: grossly normal mood and affect, speech fluent and  appropriate Neurologic: grossly non-focal.          Labs on Admission:  Basic Metabolic Panel:  Recent Labs Lab 12/31/13 1128  NA 133*  K 4.1  CL 99  CO2 26  GLUCOSE 112*  BUN 13  CREATININE 0.68  CALCIUM 8.6   Liver Function Tests: No results found for this basename: AST, ALT, ALKPHOS, BILITOT, PROT, ALBUMIN,  in the last 168 hours No results found for this basename: LIPASE, AMYLASE,  in the last 168 hours No results found for this basename: AMMONIA,  in the last 168 hours CBC:  Recent Labs Lab 12/31/13 1128  WBC 9.5  NEUTROABS 5.8  HGB 12.7  HCT 37.0  MCV 91.6  PLT 239   Cardiac Enzymes:  Recent Labs Lab 12/31/13 1128  CKTOTAL 16109*    BNP (last 3 results) No results found for this basename: PROBNP,  in the last 8760 hours CBG: No results found for this basename: GLUCAP,  in the last 168 hours  Radiological Exams on Admission: Dg Forearm Left  12/31/2013   CLINICAL DATA:  Patient fell out of bed this morning. Pain localizing to the mid left forearm and left wrist. Initial encounter.  EXAM: LEFT FOREARM - 2 VIEW  COMPARISON:  Left wrist x-rays obtained concurrently.  FINDINGS: No evidence of acute fracture involving the radius or ulna. No intrinsic osseous abnormality. Visualized elbow joint intact.  IMPRESSION: Normal examination.   Electronically Signed   By: Hulan Saas M.D.   On: 12/31/2013 08:53   Dg Wrist Complete Left  12/31/2013   CLINICAL DATA:  Patient fell out of bed this morning. Pain localizing to the mid left forearm and left wrist. Initial encounter.  EXAM: LEFT WRIST - COMPLETE 3+ VIEW  COMPARISON:  Left forearm x-rays obtained concurrently.  FINDINGS: No evidence of acute fracture or dislocation. Joint spaces well preserved. Well-preserved bone mineral density. No intrinsic osseous abnormalities.  IMPRESSION: Normal examination.   Electronically Signed   By: Hulan Saas M.D.   On: 12/31/2013 08:58   Dg Hip Complete Left  12/31/2013    CLINICAL DATA:  Patient fell out of bed this morning. Pain localizing to the left hip, mid left forearm and left wrist. Initial encounter.  EXAM: LEFT HIP - COMPLETE 2+ VIEW  COMPARISON:  None.  FINDINGS: No evidence of acute fracture or dislocation. Well preserved joint space. Well preserved bone mineral density. No intrinsic osseous abnormality.  Included AP pelvis demonstrates no fractures elsewhere. Sacroiliac joints and symphysis pubis intact. Contralateral right hip joint intact. Visualized lower lumbar spine intact.  IMPRESSION: Normal examination.   Electronically Signed   By: Hulan Saas M.D.   On: 12/31/2013 08:57   Mr Pelvis W Wo Contrast  12/31/2013   CLINICAL DATA:  Left hip pain. Abnormal CT showing muscular and fascia all edema.  EXAM: MRI PELVIS WITHOUT AND WITH CONTRAST  TECHNIQUE: Multiplanar multisequence MR imaging of the pelvis was performed  both before and after administration of intravenous contrast.  CONTRAST:  10mL MULTIHANCE GADOBENATE DIMEGLUMINE 529 MG/ML IV SOLN  COMPARISON:  12/31/2013 CT scan  FINDINGS: Severe edema of the left gluteus maximus, medius, and minimus muscles; and obturator internus muscle. Abnormal edema also tracks around and in some areas within the left piriformis, tensor fascia lata, sartorius, rectus femorals, and lateral portion of the iliopsoas. Abnormal edema tracks through the sciatic notch and into the pelvis and presacral space or crosses midline. There is extensive abnormal edema surrounding the left sciatic nerve both in the pelvis and in the vicinity of the left hip. The nerve itself does not appear obviously expanded. I do not see an impinging lesion along the sacral plexus although there is edema tracking along the left anterior sacrum.  On post-contrast images, I do not see an abscess, but there are areas of abnormal hypoenhancement in the left gluteus minimus, gluteus medius, lateral portion of the gluteus maximus muscle, and obturator internus  muscles compatible with my necrosis.  Uterus and ovaries unremarkable. No osseous edema or findings of acute bony injury.  IMPRESSION: 1. Regions of myonecrosis in the gluteus minimus, gluteus medius, gluteus maximus, and obturator internus muscles with severe diffuse edema in these muscles, and also abnormal edema within and along portions of the left piriformis, tensor fascia lata, sartorius, rectus femorals, and iliopsoas muscles. Abnormal regional edema and enhancement/fasciitis tracks in the presacral space, in the sciatic notch, and around the left sciatic nerve. Appearance compatible with severe myositis, potentially related to substance abuse, infection, or autoimmune disease. Posttraumatic myonecrosis related to a fall from a bed seems improbable, for example in light of the myonecrosis involving the intrapelvic portion of the obturator internus without adjacent bony fracture. The patient does not currently have leukocytosis.   Electronically Signed   By: Herbie Baltimore M.D.   On: 12/31/2013 14:12   Ct Hip Left Wo Contrast  12/31/2013   CLINICAL DATA:  Fall from bed.  Left hip pain.  EXAM: CT OF THE LEFT HIP WITHOUT CONTRAST  TECHNIQUE: Multidetector CT imaging of the left hip was performed according to the standard protocol. Multiplanar CT image reconstructions were also generated.  COMPARISON:  12/31/2013 radiographs; CT scan from 05/31/2008  FINDINGS: Mild spurring of the femoral head and of the acetabulum, increased from 2010 and mildly advanced for age. Penetrating cortical vessels appear to account for the cortical irregularities along the anterior and superior femoral neck base for example on image 27 of series 3. No well-defined fracture.  There is abnormal edema tracking around and perhaps within the gluteus medius, gluteus minimus, piriformis, and obturator internus muscles on the left. Edema tracks deep to the tensor fascia lata and iliotibial band. There seems to be a small amount of gas in  the gluteus maximus posteriorly. Small amount of presacral edema and edema tracking in the sciatic notch with poor definition of the sciatic nerve resulting.  No definite hip effusion.  Iliopsoas tendon intact.  IMPRESSION: 1. Unusual constellation of soft tissue findings. Abnormal edema within and tracking around the gluteus musculature, obturator internus (including the intrapelvic portion), iliotibial band, tensor fascia lata, piriformis muscle, sciatic notch, and in the presacral space. Small amount of gas in the gluteus maximus. Did the patient have an intramuscular injection or a penetrating injury? An infectious process such as myositis and fasciitis cannot be excluded. Conceivably this appearance could be posttraumatic although muscular distribution of involvement is unusual for the reported fall from bed. MRI of  the bony pelvis with and without contrast may be warranted to investigate the region further. 2. Advanced for age spurring along the left acetabulum and left proximal femur. Slight cortical tubular lucencies favor potential hyperemia in the left femoral neck region, but I do not see definite cortical discontinuity. Please note that today's exam was focused on the hip and did not scan the entire pelvis.   Electronically Signed   By: Herbie Baltimore M.D.   On: 12/31/2013 11:16    Assessment/Plan Active Problems:   Myositis   Rhabdomyolysis   Fall   1. Myositis related to her fall. Continue with pain management. Orthopedic consult in a.m. We'll give intermittent Toradol as well as narcotics as needed. She is neurovascularly intact at this time and does not have any evidence of compartment syndrome. 2. Tramatic rhabdomyolysis. We'll continue with aggressive IV hydration. At this time her renal function appears to be normal. This will be followed as well as serial CK levels. Also check TSH, although it appears that her CK elevation is related to trauma.   Code Status: full code DVT  Prophylaxis: lovenox Family Communication: discussed with patient Disposition Plan: discharge home once improved  Time spent:36mins  [facility] Pager [phone]

## 2013-12-31 NOTE — Progress Notes (Signed)
Nurse administering pain medication to pt at this time. Pt very demanding and verbally abusive to nurse. Explained to pt that nurse was in another patients room when she called for pain medication 20 minutes prior and came as soon as possible. Pt upset at nurse and verbally abusive d/t pt confusing pain medication as scheduled instead of PRN. Pt demanding Dilaudid and Oxycodone at the same time. Nurse explained to pt that pts BP is low at this time and nurse doesn't feel it's safe to give both together. Will reassess in 30 mins-1 hour and will administer if BP stable and pt is still in pain. Nurse attempted to calm pt down. Pt calm before nurse exited the room. No further complaints. Bed is in lowest position & call bell is within reach. Will continue to monitor pt throughout night.  Notified Tisha, Charge nurse regarding the situation. Dr Kerry Hough was notified. Dr Kerry Hough said the pain medication needs to be given 1 hour apart, and not at the same time d/t low BP. Will explain this to pt.

## 2013-12-31 NOTE — Progress Notes (Signed)
Pt called nurse asking if her boyfriend can take her outside to smoke a cigarette. Nurse explained that the hospital is a no smoking facility, but that I will request a nicotine patch. Paged [facility], requesting nicotine patch. Will monitor pt frequently throughout night.

## 2013-12-31 NOTE — ED Notes (Signed)
Patient gone to CT. 

## 2013-12-31 NOTE — Discharge Instructions (Signed)
If you were given medicines take as directed.  If you are on coumadin or contraceptives realize their levels and effectiveness is altered by many different medicines.  If you have any reaction (rash, tongues swelling, other) to the medicines stop taking and see a physician.   Please follow up as directed and return to the ER or see a physician for new or worsening symptoms.  Thank you. Filed Vitals:   12/31/13 0741  BP: 125/75  Pulse: 125  Temp: 98.7 F (37.1 C)  TempSrc: Oral  Resp: 22  Height:  (1.575 m)  Weight: 105 lb (47.628 kg)  SpO2: 100%

## 2013-12-31 NOTE — ED Notes (Signed)
Informed of pending MRI

## 2013-12-31 NOTE — ED Notes (Signed)
Patient states taking OTC sleep aid last night. Rolled out of bed this morning and onto left side. Deformity to Left forearm/wrist area. States hand on left side is numb. Cap refill at three seconds, pulse present and strong. C/o left hip pain as well and that she is unable to bear weight on hip. CMS of left leg intact. Denies hitting head, denies LOC.

## 2013-12-31 NOTE — ED Notes (Signed)
Pt family member brought pt food; pt informed she must remain NPO.

## 2013-12-31 NOTE — ED Provider Notes (Signed)
CSN: [id number]     Arrival date & time 12/31/13  0732 History  This chart was scribed for Enid Skeens, MD by Gwenyth Ober, ED Scribe. This patient was seen in room APA12/APA12 and the patient's care was started at 7:50 AM.     Chief Complaint  Patient presents with  . Fall   The history is provided by the patient. No language interpreter was used.   HPI Comments: Chelsea May is a 28 y.o. female who presents to the Emergency Department complaining of constant, severe left arm, left hip and left leg pain after she fell off of her bed earlier this morning. Pt took OTC sleep aid last night prior to the fall. Pt was admitted to ER with multiple left finger fractures on 12/13/2013 after she fell out of her bed, but was unable to afford casting. Pt denies hitting head and LOC during fall.    Past Medical History  Diagnosis Date  . Substance abuse     opiates, detox 05/2011  . Heart murmur   . Hx of nausea and vomiting    Past Surgical History  Procedure Laterality Date  . Back surgery      "tumor" removed from her back   No family history on file. History  Substance Use Topics  . Smoking status: Current Every Day Smoker -- 0.50 packs/day    Types: Cigarettes  . Smokeless tobacco: Not on file  . Alcohol Use: No   OB History   Grav Para Term Preterm Abortions TAB SAB Ect Mult Living   Review of Systems  Constitutional: Negative for fever and chills.  HENT: Negative for congestion.   Eyes: Negative for visual disturbance.  Respiratory: Negative for shortness of breath.   Cardiovascular: Negative for chest pain.  Gastrointestinal: Negative for vomiting and abdominal pain.  Genitourinary: Negative for dysuria and flank pain.  Musculoskeletal: Positive for arthralgias and joint swelling. Negative for back pain, neck pain and neck stiffness.  Skin: Negative for rash and wound.  Neurological: Negative for light-headedness and headaches.  All other  systems reviewed and are negative.     Allergies  Hydrocodone  Home Medications   Prior to Admission medications   Medication Sig Start Date End Date Taking? Authorizing Provider  naproxen sodium (ALEVE) 220 MG tablet Take 220 mg by mouth 2 (two) times daily as needed (pain).    Historical Provider, MD  oxyCODONE (ROXICODONE) 5 MG immediate release tablet Take 2 tablets (10 mg total) by mouth every 4 (four) hours as needed for severe pain. 12/13/13   Burgess Amor, PA-C  Oxycodone HCl 10 MG TABS Take 1 tablet (10 mg total) by mouth every 6 (six) hours as needed. 12/10/13   Hope Orlene Och, NP  promethazine (PHENERGAN) 25 MG tablet Take 1 tablet (25 mg total) by mouth every 6 (six) hours as needed for nausea or vomiting. 12/08/13   Tammy L. Triplett, PA-C   BP 125/75  Pulse 125  Temp(Src) 98.7 F (37.1 C) (Oral)  Resp 22  Ht  (1.575 m)  Wt 105 lb (47.628 kg)  BMI 19.20 kg/m2  SpO2 100%  LMP 11/27/2013  Physical Exam  Nursing note and vitals reviewed. Constitutional: She is oriented to person, place, and time. She appears well-developed and well-nourished. No distress.  HENT:  Head: Normocephalic and atraumatic.  Mouth/Throat: Oropharynx is clear and moist. No oropharyngeal exudate.  Eyes:  Pupils are equal, round, and reactive to light.  Neck: Neck supple.  Cardiovascular: Tachycardia present.   Pulmonary/Chest: Effort normal.  Abdominal: Soft. There is no tenderness.  No ecchymosis across the abdomen   Musculoskeletal: She exhibits edema and tenderness.  Significant pain with internal and external rotation of hip, tender to palpation lateral left hip joint and left iliac crest, good pulses  Pt has mild tenderness and swelling/ deformity to dorsal aspect of the mid and lateral left wrist. Pt has moderate pain with flexion, good pulses. Compartment soft. NV intact distal arm and leg at injured sites Tenderness to lateral aspect of left shoulder. Pt has good ROM of left shoulder.  Mild abrasion left lateral shoulder  Soft compartments, NV intact UE and LE  Neurological: She is alert and oriented to person, place, and time. No cranial nerve deficit.  good sensation at the left hip  Skin: Skin is warm and dry. No rash noted.  Psychiatric: She has a normal mood and affect. Her behavior is normal.    ED Course  Procedures (including critical care time) DIAGNOSTIC STUDIES: Oxygen Saturation is 100% on RA, normal by my interpretation.    COORDINATION OF CARE: 8:01 AM Will order X-ray of pt left wrist, left hip, and left shoulder and order pain medication. Will refer pt to orthopedics upon discharge. Pt agreed to tx plan.   Labs Review Labs Reviewed  URINALYSIS, ROUTINE W REFLEX MICROSCOPIC - Abnormal; Notable for the following:    Color, Urine Kristine (*)    APPearance CLOUDY (*)    Hgb urine dipstick LARGE (*)    Leukocytes, UA SMALL (*)    All other components within normal limits  URINE MICROSCOPIC-ADD ON - Abnormal; Notable for the following:    Squamous Epithelial / LPF FEW (*)    Bacteria, UA MANY (*)    All other components within normal limits  BASIC METABOLIC PANEL - Abnormal; Notable for the following:    Sodium 133 (*)    Glucose, Bld 112 (*)    All other components within normal limits  CK - Abnormal; Notable for the following:    Total CK [address]331 (*)    All other components within normal limits  CBC WITH DIFFERENTIAL  SEDIMENTATION RATE  POC URINE PREG, ED    Imaging Review Dg Forearm Left  12/31/2013   CLINICAL DATA:  Patient fell out of bed this morning. Pain localizing to the mid left forearm and left wrist. Initial encounter.  EXAM: LEFT FOREARM - 2 VIEW  COMPARISON:  Left wrist x-rays obtained concurrently.  FINDINGS: No evidence of acute fracture involving the radius or ulna. No intrinsic osseous abnormality. Visualized elbow joint intact.  IMPRESSION: Normal examination.   Electronically Signed   By: Hulan Saas M.D.   On: 12/31/2013  08:53   Dg Wrist Complete Left  12/31/2013   CLINICAL DATA:  Patient fell out of bed this morning. Pain localizing to the mid left forearm and left wrist. Initial encounter.  EXAM: LEFT WRIST - COMPLETE 3+ VIEW  COMPARISON:  Left forearm x-rays obtained concurrently.  FINDINGS: No evidence of acute fracture or dislocation. Joint spaces well preserved. Well-preserved bone mineral density. No intrinsic osseous abnormalities.  IMPRESSION: Normal examination.   Electronically Signed   By: Hulan Saas M.D.   On: 12/31/2013 08:58   Dg Hip Complete Left  12/31/2013   CLINICAL DATA:  Patient fell out of bed this morning. Pain localizing to the left hip, mid left  forearm and left wrist. Initial encounter.  EXAM: LEFT HIP - COMPLETE 2+ VIEW  COMPARISON:  None.  FINDINGS: No evidence of acute fracture or dislocation. Well preserved joint space. Well preserved bone mineral density. No intrinsic osseous abnormality.  Included AP pelvis demonstrates no fractures elsewhere. Sacroiliac joints and symphysis pubis intact. Contralateral right hip joint intact. Visualized lower lumbar spine intact.  IMPRESSION: Normal examination.   Electronically Signed   By: Hulan Saas M.D.   On: 12/31/2013 08:57   Mr Pelvis W Wo Contrast  12/31/2013   CLINICAL DATA:  Left hip pain. Abnormal CT showing muscular and fascia all edema.  EXAM: MRI PELVIS WITHOUT AND WITH CONTRAST  TECHNIQUE: Multiplanar multisequence MR imaging of the pelvis was performed both before and after administration of intravenous contrast.  CONTRAST:  10mL MULTIHANCE GADOBENATE DIMEGLUMINE 529 MG/ML IV SOLN  COMPARISON:  12/31/2013 CT scan  FINDINGS: Severe edema of the left gluteus maximus, medius, and minimus muscles; and obturator internus muscle. Abnormal edema also tracks around and in some areas within the left piriformis, tensor fascia lata, sartorius, rectus femorals, and lateral portion of the iliopsoas. Abnormal edema tracks through the sciatic  notch and into the pelvis and presacral space or crosses midline. There is extensive abnormal edema surrounding the left sciatic nerve both in the pelvis and in the vicinity of the left hip. The nerve itself does not appear obviously expanded. I do not see an impinging lesion along the sacral plexus although there is edema tracking along the left anterior sacrum.  On post-contrast images, I do not see an abscess, but there are areas of abnormal hypoenhancement in the left gluteus minimus, gluteus medius, lateral portion of the gluteus maximus muscle, and obturator internus muscles compatible with my necrosis.  Uterus and ovaries unremarkable. No osseous edema or findings of acute bony injury.  IMPRESSION: 1. Regions of myonecrosis in the gluteus minimus, gluteus medius, gluteus maximus, and obturator internus muscles with severe diffuse edema in these muscles, and also abnormal edema within and along portions of the left piriformis, tensor fascia lata, sartorius, rectus femorals, and iliopsoas muscles. Abnormal regional edema and enhancement/fasciitis tracks in the presacral space, in the sciatic notch, and around the left sciatic nerve. Appearance compatible with severe myositis, potentially related to substance abuse, infection, or autoimmune disease. Posttraumatic myonecrosis related to a fall from a bed seems improbable, for example in light of the myonecrosis involving the intrapelvic portion of the obturator internus without adjacent bony fracture. The patient does not currently have leukocytosis.   Electronically Signed   By: Herbie Baltimore M.D.   On: 12/31/2013 14:12   Ct Hip Left Wo Contrast  12/31/2013   CLINICAL DATA:  Fall from bed.  Left hip pain.  EXAM: CT OF THE LEFT HIP WITHOUT CONTRAST  TECHNIQUE: Multidetector CT imaging of the left hip was performed according to the standard protocol. Multiplanar CT image reconstructions were also generated.  COMPARISON:  12/31/2013 radiographs; CT scan from  05/31/2008  FINDINGS: Mild spurring of the femoral head and of the acetabulum, increased from 2010 and mildly advanced for age. Penetrating cortical vessels appear to account for the cortical irregularities along the anterior and superior femoral neck base for example on image 27 of series 3. No well-defined fracture.  There is abnormal edema tracking around and perhaps within the gluteus medius, gluteus minimus, piriformis, and obturator internus muscles on the left. Edema tracks deep to the tensor fascia lata and iliotibial band. There seems to be  a small amount of gas in the gluteus maximus posteriorly. Small amount of presacral edema and edema tracking in the sciatic notch with poor definition of the sciatic nerve resulting.  No definite hip effusion.  Iliopsoas tendon intact.  IMPRESSION: 1. Unusual constellation of soft tissue findings. Abnormal edema within and tracking around the gluteus musculature, obturator internus (including the intrapelvic portion), iliotibial band, tensor fascia lata, piriformis muscle, sciatic notch, and in the presacral space. Small amount of gas in the gluteus maximus. Did the patient have an intramuscular injection or a penetrating injury? An infectious process such as myositis and fasciitis cannot be excluded. Conceivably this appearance could be posttraumatic although muscular distribution of involvement is unusual for the reported fall from bed. MRI of the bony pelvis with and without contrast may be warranted to investigate the region further. 2. Advanced for age spurring along the left acetabulum and left proximal femur. Slight cortical tubular lucencies favor potential hyperemia in the left femoral neck region, but I do not see definite cortical discontinuity. Please note that today's exam was focused on the hip and did not scan the entire pelvis.   Electronically Signed   By: Herbie Baltimore M.D.   On: 12/31/2013 11:16     EKG Interpretation None      MDM   Final  diagnoses:  Acute hip pain, left  Left forearm pain  Fall, initial encounter   Patient with fall from her bed and significant left hip and left forearm pain, deformity to left forearm concern for fracture. Pain medicines given, x-rays pending.   X-ray no acute fracture however patient having pain out of proportion. Repeat pain medicines given. CT performed and showed significant swelling/myositis. MRI performed confirming myositis/fasciitis.  Discussed with orthopedics on call for [place] reviewed radiology studies and recommended primarily pain control. Discussed with [facility] hospitalist for observation for pain control orthopedics locally see patient in the morning. Patient has no fever, no white count, mechanism traumatic.  The patients results and plan were reviewed and discussed.   Any x-rays performed were personally reviewed by myself.   Differential diagnosis were considered with the presenting HPI.  Medications  fentaNYL (SUBLIMAZE) injection 50 mcg (50 mcg Intravenous Given 12/31/13 1506)  ondansetron (ZOFRAN) injection 4 mg (not administered)  HYDROmorphone (DILAUDID) injection 0.5 mg (not administered)  morphine 4 MG/ML injection 6 mg (6 mg Intramuscular Given 12/31/13 0807)  fentaNYL (SUBLIMAZE) injection 75 mcg (75 mcg Intravenous Given 12/31/13 0859)  ketorolac (TORADOL) 30 MG/ML injection 30 mg (30 mg Intravenous Given 12/31/13 0935)  gadobenate dimeglumine (MULTIHANCE) injection 10 mL (10 mLs Intravenous Contrast Given 12/31/13 1330)  sodium chloride 0.9 % bolus 1,000 mL (1,000 mLs Intravenous New Bag/Given 12/31/13 1501)  ketorolac (TORADOL) 30 MG/ML injection 15 mg (15 mg Intravenous Given 12/31/13 1500)    Filed Vitals:   12/31/13 0741 12/31/13 1018 12/31/13 1352  BP: 125/75 110/60 93/50  Pulse: 125 107 95  Temp: 98.7 F (37.1 C)    TempSrc: Oral    Resp: Height:  (1.575 m)    Weight: 105 lb (47.628 kg)    SpO2: 100% 100% 99%    Final diagnoses:   Acute hip pain, left  Left forearm pain  Fall, initial encounter  Myositis  Admission/ observation were discussed with the admitting physician, patient and/or family and they are comfortable with the plan.    Enid Skeens, MD 12/31/13 [phone]

## 2013-12-31 NOTE — ED Notes (Signed)
Pt gone to MR 

## 2013-12-31 NOTE — ED Notes (Signed)
Pt back from x-ray.

## 2013-12-31 NOTE — ED Notes (Signed)
Pt tolerated ambulation in room, however complained of worsening pain the entire time.

## 2013-12-31 NOTE — Progress Notes (Signed)
ED/CM noted patient did not have health insurance and/or PCP listed in the computer.  Patient was given the [place] resources handout with information on the clinics, food pantries, and the handout for new health insurance sign-up. Also, provided drug discount card.  Patient expressed appreciation for information received.   

## 2014-01-01 ENCOUNTER — Encounter (HOSPITAL_COMMUNITY): Payer: Self-pay | Admitting: Internal Medicine

## 2014-01-01 DIAGNOSIS — R52 Pain, unspecified: Secondary | ICD-10-CM | POA: Diagnosis present

## 2014-01-01 DIAGNOSIS — M6289 Other specified disorders of muscle: Secondary | ICD-10-CM | POA: Diagnosis present

## 2014-01-01 LAB — BASIC METABOLIC PANEL
ANION GAP: 7 (ref 5–15)
BUN: 9 mg/dL (ref 6–23)
CALCIUM: 8 mg/dL — AB (ref 8.4–10.5)
CO2: 26 mEq/L (ref 19–32)
Chloride: 107 mEq/L (ref 96–112)
Creatinine, Ser: 0.6 mg/dL (ref 0.50–1.10)
GFR calc Af Amer: >90 mL/min (ref >90)
GLUCOSE: 107 mg/dL — AB (ref 70–99)
POTASSIUM: 4 meq/L (ref 3.7–5.3)
SODIUM: 140 meq/L (ref 137–147)

## 2014-01-01 LAB — CBC
HCT: 33 % — ABNORMAL LOW (ref 36.0–46.0)
Hemoglobin: 11.1 g/dL — ABNORMAL LOW (ref 12.0–15.0)
MCH: 30.9 pg (ref 26.0–34.0)
MCHC: 33.6 g/dL (ref 30.0–36.0)
MCV: 91.9 fL (ref 78.0–100.0)
PLATELETS: 182 10*3/uL (ref 150–400)
RBC: 3.59 MIL/uL — ABNORMAL LOW (ref 3.87–5.11)
RDW: 12.8 % (ref 11.5–15.5)
WBC: 5.4 10*3/uL (ref 4.0–10.5)

## 2014-01-01 LAB — CK: CK TOTAL: 11756 U/L — AB (ref 7–177)

## 2014-01-01 LAB — RAPID URINE DRUG SCREEN, HOSP PERFORMED
AMPHETAMINES: NOT DETECTED
Barbiturates: NOT DETECTED
Benzodiazepines: POSITIVE — AB
COCAINE: NOT DETECTED
Opiates: POSITIVE — AB
Tetrahydrocannabinol: POSITIVE — AB

## 2014-01-01 MED ORDER — HYDROMORPHONE HCL 1 MG/ML IJ SOLN
1.0000 mg | Freq: Once | INTRAMUSCULAR | Status: AC
  Administered 2014-01-01: 1 mg via INTRAVENOUS
  Filled 2014-01-01: qty 1

## 2014-01-01 MED ORDER — OXYCODONE HCL 5 MG PO TABS
5.0000 mg | ORAL_TABLET | ORAL | Status: DC | PRN
  Administered 2014-01-01 (×2): 5 mg via ORAL
  Filled 2014-01-01 (×2): qty 1

## 2014-01-01 MED ORDER — OXYCODONE HCL 5 MG PO TABS
5.0000 mg | ORAL_TABLET | ORAL | Status: DC | PRN
  Administered 2014-01-01 – 2014-01-03 (×22): 5 mg via ORAL
  Filled 2014-01-01 (×22): qty 1

## 2014-01-01 NOTE — Progress Notes (Signed)
[facility] PROGRESS NOTE  Chelsea May GEX:[id number] DOB: 1985-12-11 DOA: 12/31/2013 PCP: No PCP Per Patient  Assessment/Plan: 1. 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 Spoke with Dr Lorrin Mais hospitalist at [facility] who reviewed information and opined clinical status does not warrant transfer to higher level of care. Spoke with Dr. Eulah Pont orthopedic surgery at [facility] long who opined at this point no indication for surgery. Recommended continued IV fluids with monitoring for infection/abscess. Currently afebrile with no leukocytosis. Difficulty managing pain. Continue  intermittent Toradol as well as narcotics as needed. She remains neurovascularly intact at this time and does not have any evidence of compartment syndrome. 2. Tramatic rhabdomyolysis. CK trending downward.  We'll continue with aggressive IV hydration. Renal function remains normal. This will be followed as well as serial CK levels. TSH 1.72, sed rate 6. 3. ?UTI: urine with  Small leuks. 7-10 WBC, RBC 21-50. No dysuria, afebrile and non-toxic appearing. Monitor output. Provide antibiotic as indicated.  4. Hematuria: microscopic. Related to #2. Output good. Hg stable. Monitor 5. Hyponatremia: resolved with IV fluids will monitor    Code Status: full Family Communication: none present Disposition Plan: home when ready likely 2-3 days   Consultants:  Dr Wandra Feinstein ortho on phone  Procedures:  none  Antibiotics:  none  HPI/Subjective: Awake, crying complaining of pain left leg but mostly left buttock  Objective: Filed Vitals:   01/01/14 0006  BP: 102/62  Pulse: 76  Temp:   Resp:     Intake/Output Summary (Last 24 hours) at 01/01/14 1223 Last data filed at 12/31/13 1848  Gross per 24 hour  Intake  307.5 ml  Output    200 ml  Net  107.5 ml   Filed Weights   12/31/13 0741 12/31/13 1641  Weight: 47.628 kg (105 lb) 47.628 kg (105 lb)    Exam:   General:  Appears somewhat uncomfortable  especially with movement  Cardiovascular: RRR No MGR No LE edema  Respiratory: normal effort BS clear bilaterally no wheeze no rhonchi  Abdomen: soft +BS non-tender to palpation  Musculoskeletal: left leg without erythema/swelling left leg/ hip, lower back tender to palpation   Data Reviewed: Basic Metabolic Panel:  Recent Labs Lab 12/31/13 1128 01/01/14 0708  NA 133* 140  K 4.1 4.0  CL 99 107  CO2 26 26  GLUCOSE 112* 107*  BUN 13 9  CREATININE 0.68 0.60  CALCIUM 8.6 8.0*   Liver Function Tests: No results found for this basename: AST, ALT, ALKPHOS, BILITOT, PROT, ALBUMIN,  in the last 168 hours No results found for this basename: LIPASE, AMYLASE,  in the last 168 hours No results found for this basename: AMMONIA,  in the last 168 hours CBC:  Recent Labs Lab 12/31/13 1128 01/01/14 0708  WBC 9.5 5.4  NEUTROABS 5.8  --   HGB 12.7 11.1*  HCT 37.0 33.0*  MCV 91.6 91.9  PLT 239 182   Cardiac Enzymes:  Recent Labs Lab 12/31/13 1128 01/01/14 0708  CKTOTAL 01027* 11756*   BNP (last 3 results) No results found for this basename: PROBNP,  in the last 8760 hours CBG: No results found for this basename: GLUCAP,  in the last 168 hours  No results found for this or any previous visit (from the past 240 hour(s)).   Studies: Dg Forearm Left  12/31/2013   CLINICAL DATA:  Patient fell out of bed this morning. Pain localizing to the mid left forearm and left wrist. Initial encounter.  EXAM: LEFT FOREARM -  2 VIEW  COMPARISON:  Left wrist x-rays obtained concurrently.  FINDINGS: No evidence of acute fracture involving the radius or ulna. No intrinsic osseous abnormality. Visualized elbow joint intact.  IMPRESSION: Normal examination.   Electronically Signed   By: Hulan Saas M.D.   On: 12/31/2013 08:53   Dg Wrist Complete Left  12/31/2013   CLINICAL DATA:  Patient fell out of bed this morning. Pain localizing to the mid left forearm and left wrist. Initial encounter.   EXAM: LEFT WRIST - COMPLETE 3+ VIEW  COMPARISON:  Left forearm x-rays obtained concurrently.  FINDINGS: No evidence of acute fracture or dislocation. Joint spaces well preserved. Well-preserved bone mineral density. No intrinsic osseous abnormalities.  IMPRESSION: Normal examination.   Electronically Signed   By: Hulan Saas M.D.   On: 12/31/2013 08:58   Dg Hip Complete Left  12/31/2013   CLINICAL DATA:  Patient fell out of bed this morning. Pain localizing to the left hip, mid left forearm and left wrist. Initial encounter.  EXAM: LEFT HIP - COMPLETE 2+ VIEW  COMPARISON:  None.  FINDINGS: No evidence of acute fracture or dislocation. Well preserved joint space. Well preserved bone mineral density. No intrinsic osseous abnormality.  Included AP pelvis demonstrates no fractures elsewhere. Sacroiliac joints and symphysis pubis intact. Contralateral right hip joint intact. Visualized lower lumbar spine intact.  IMPRESSION: Normal examination.   Electronically Signed   By: Hulan Saas M.D.   On: 12/31/2013 08:57   Mr Pelvis W Wo Contrast  12/31/2013   CLINICAL DATA:  Left hip pain. Abnormal CT showing muscular and fascia all edema.  EXAM: MRI PELVIS WITHOUT AND WITH CONTRAST  TECHNIQUE: Multiplanar multisequence MR imaging of the pelvis was performed both before and after administration of intravenous contrast.  CONTRAST:  10mL MULTIHANCE GADOBENATE DIMEGLUMINE 529 MG/ML IV SOLN  COMPARISON:  12/31/2013 CT scan  FINDINGS: Severe edema of the left gluteus maximus, medius, and minimus muscles; and obturator internus muscle. Abnormal edema also tracks around and in some areas within the left piriformis, tensor fascia lata, sartorius, rectus femorals, and lateral portion of the iliopsoas. Abnormal edema tracks through the sciatic notch and into the pelvis and presacral space or crosses midline. There is extensive abnormal edema surrounding the left sciatic nerve both in the pelvis and in the vicinity of the  left hip. The nerve itself does not appear obviously expanded. I do not see an impinging lesion along the sacral plexus although there is edema tracking along the left anterior sacrum.  On post-contrast images, I do not see an abscess, but there are areas of abnormal hypoenhancement in the left gluteus minimus, gluteus medius, lateral portion of the gluteus maximus muscle, and obturator internus muscles compatible with my necrosis.  Uterus and ovaries unremarkable. No osseous edema or findings of acute bony injury.  IMPRESSION: 1. Regions of myonecrosis in the gluteus minimus, gluteus medius, gluteus maximus, and obturator internus muscles with severe diffuse edema in these muscles, and also abnormal edema within and along portions of the left piriformis, tensor fascia lata, sartorius, rectus femorals, and iliopsoas muscles. Abnormal regional edema and enhancement/fasciitis tracks in the presacral space, in the sciatic notch, and around the left sciatic nerve. Appearance compatible with severe myositis, potentially related to substance abuse, infection, or autoimmune disease. Posttraumatic myonecrosis related to a fall from a bed seems improbable, for example in light of the myonecrosis involving the intrapelvic portion of the obturator internus without adjacent bony fracture. The patient does not currently  have leukocytosis.   Electronically Signed   By: Herbie Baltimore M.D.   On: 12/31/2013 14:12   Ct Hip Left Wo Contrast  12/31/2013   CLINICAL DATA:  Fall from bed.  Left hip pain.  EXAM: CT OF THE LEFT HIP WITHOUT CONTRAST  TECHNIQUE: Multidetector CT imaging of the left hip was performed according to the standard protocol. Multiplanar CT image reconstructions were also generated.  COMPARISON:  12/31/2013 radiographs; CT scan from 05/31/2008  FINDINGS: Mild spurring of the femoral head and of the acetabulum, increased from 2010 and mildly advanced for age. Penetrating cortical vessels appear to account for  the cortical irregularities along the anterior and superior femoral neck base for example on image 27 of series 3. No well-defined fracture.  There is abnormal edema tracking around and perhaps within the gluteus medius, gluteus minimus, piriformis, and obturator internus muscles on the left. Edema tracks deep to the tensor fascia lata and iliotibial band. There seems to be a small amount of gas in the gluteus maximus posteriorly. Small amount of presacral edema and edema tracking in the sciatic notch with poor definition of the sciatic nerve resulting.  No definite hip effusion.  Iliopsoas tendon intact.  IMPRESSION: 1. Unusual constellation of soft tissue findings. Abnormal edema within and tracking around the gluteus musculature, obturator internus (including the intrapelvic portion), iliotibial band, tensor fascia lata, piriformis muscle, sciatic notch, and in the presacral space. Small amount of gas in the gluteus maximus. Did the patient have an intramuscular injection or a penetrating injury? An infectious process such as myositis and fasciitis cannot be excluded. Conceivably this appearance could be posttraumatic although muscular distribution of involvement is unusual for the reported fall from bed. MRI of the bony pelvis with and without contrast may be warranted to investigate the region further. 2. Advanced for age spurring along the left acetabulum and left proximal femur. Slight cortical tubular lucencies favor potential hyperemia in the left femoral neck region, but I do not see definite cortical discontinuity. Please note that today's exam was focused on the hip and did not scan the entire pelvis.   Electronically Signed   By: Herbie Baltimore M.D.   On: 12/31/2013 11:16    Scheduled Meds: . enoxaparin (LOVENOX) injection  40 mg Subcutaneous Q24H  . famotidine  20 mg Oral BID  . nicotine  21 mg Transdermal Daily   Continuous Infusions: . sodium chloride 150 mL/hr at 01/01/14 1058     Principal Problem:   Rhabdomyolysis Active Problems:   Myositis   Fall   Myonecrosis   Intractable pain    Time spent: 60 minutes    [facility] M  [facility] Pager [phone]. If 7PM-7AM, please contact night-coverage at [email or web address], password [facility] 01/01/2014, 12:23 PM  LOS: 1 day

## 2014-01-01 NOTE — Progress Notes (Signed)
Patient seen, independently examined and chart reviewed. I agree with exam, assessment and plan discussed with Toya Smothers, NP.  28 year old presented with severe left thigh, hip and arm pain s/p fall out of bed when she struck the night-stand and a stool (bed quite high). She fell out of bed last month and fractured her left hand and since then has had difficulty gripping.  CT left hip and MRI pelvis were grossly abnormal. CK was 16109, now 11756  Currently her pain is controlled. She has significant left thigh and buttock pain with movement. Left forearm and hand painful but no numbness.  Objective: afebrile, VSS  Gen. Appears calm and comfortable  Psych. Speech fluent and clear, alert  Cardiovascular. RRR no mrg. No LE edema  Respiratory. CTA bilaterally no wrr. Normal resp effort.  Skin. Minimal bruising left thigh  Musculoskeletal. Moves both legs without difficulty. Feet warm and dry, 2+ bilateral dorsalis pedis pulses, sensation intact both LE. Left deltoid tender, left forearm edematous and tender though compartment soft. There is sig soft tissue edema. Hand is warm and dry with strong ulnar and radial pulses. Sensation is intact in fingers and hand, grip intact though limited by known fractures and pain. There is no pallor.   UOP not recorded--will order strict I/O  Creatinine preserved, CK better  Extensive soft tissue damage of the pelvis and soft tissue edema left forearm and hand s/p fall out of bed. While CT/MRI questions the mechanism of injury the patient denies drug use, has no findings to suggest autoimmune disease, has findings only on left arm and leg c/w her history and furthermore fell out of bed last month and fractured left hand. Dr. Hilda Lias orthopedic surgeon advised contacting tertiary care center with concern for surgical need. [facility] was consulted and recommended supportive care and no operative intervention and transfer was declined. Third opinion obtained  from orthopedics at [facility] who concurred with [facility] opinion--no indication for transfer or for surgery. Conservative care recommended.  Patient with no evidence of neurovascular compromise, is more comfortable, non-toxic without fever or leukocytosis. Based on specialist recommendations will continue current management.   Brendia Sacks, MD [facility] [phone]

## 2014-01-02 DIAGNOSIS — F1910 Other psychoactive substance abuse, uncomplicated: Secondary | ICD-10-CM | POA: Diagnosis present

## 2014-01-02 DIAGNOSIS — F1110 Opioid abuse, uncomplicated: Secondary | ICD-10-CM

## 2014-01-02 DIAGNOSIS — S62309A Unspecified fracture of unspecified metacarpal bone, initial encounter for closed fracture: Secondary | ICD-10-CM

## 2014-01-02 DIAGNOSIS — S6292XA Unspecified fracture of left wrist and hand, initial encounter for closed fracture: Secondary | ICD-10-CM | POA: Diagnosis present

## 2014-01-02 LAB — CBC
HCT: 29.4 % — ABNORMAL LOW (ref 36.0–46.0)
Hemoglobin: 9.9 g/dL — ABNORMAL LOW (ref 12.0–15.0)
MCH: 30.7 pg (ref 26.0–34.0)
MCHC: 33.7 g/dL (ref 30.0–36.0)
MCV: 91.3 fL (ref 78.0–100.0)
PLATELETS: 172 10*3/uL (ref 150–400)
RBC: 3.22 MIL/uL — ABNORMAL LOW (ref 3.87–5.11)
RDW: 12.6 % (ref 11.5–15.5)
WBC: 5 10*3/uL (ref 4.0–10.5)

## 2014-01-02 LAB — BASIC METABOLIC PANEL
Anion gap: 8 (ref 5–15)
BUN: 5 mg/dL — ABNORMAL LOW (ref 6–23)
CHLORIDE: 108 meq/L (ref 96–112)
CO2: 25 mEq/L (ref 19–32)
Calcium: 8.3 mg/dL — ABNORMAL LOW (ref 8.4–10.5)
Creatinine, Ser: 0.61 mg/dL (ref 0.50–1.10)
GFR calc non Af Amer: >90 mL/min (ref >90)
Glucose, Bld: 98 mg/dL (ref 70–99)
POTASSIUM: 4.4 meq/L (ref 3.7–5.3)
Sodium: 141 mEq/L (ref 137–147)

## 2014-01-02 LAB — CK: CK TOTAL: 8722 U/L — AB (ref 7–177)

## 2014-01-02 NOTE — Care Management Utilization Note (Signed)
UR completed 
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
## 2014-01-02 NOTE — Evaluation (Signed)
Physical Therapy Evaluation Patient Details Name: Chelsea May MRN: [id number] DOB: 1985-09-19 Today's Date: 01/02/2014   History of Present Illness  HPI: Chelsea May is a 28 y.o. female with no reported past medical history who reports taking an over-the-counter sleep aid last night. She reports that one tablet. At some time overnight, the patient rolled out of bed and fell to the ground. She reports that her bed is significantly elevated with bedframe, box spring, and 2 mattresses. During her fall, she reports hitting her left thigh on a stool which she uses to clinic today in her left arm hit a night table. The patient got back into bed, but within a few hours old contusion having significant pain in her left thigh and left arm. She noticed swelling. She denies any fever, erythema. She did not have any other problems prior to her fall. She was evaluated in the emergency room where x-rays were unrevealing for any underlying fractures. MRI of her pelvis to indicate significant myositis with areas of myonecrosis in her gluteal region. Case was discussed by Dr. Jodi Mourning with Dr. Sherlean Foot on-call for orthopedics at [facility]. Images were reviewed and history was discussed. recommendations were for patient to be admitted to the hospital for supportive therapy. Antibiotics are not recommended at this time since this does not appear to be an infectious problem. Further IV hydration and pain management was recommended with formal orthopedic consult in a.m.    Clinical Impression   Pt was seen for evaluation.  She reports feeling better in general today with less pain in the left hip.  Her hip does appear edemetous but no bruising was seen.  Her hip ROM is WNL but strength is decreased due to pain with movement. She is able to ambulate with a very antalgic gait but a cane greatly improves her performance.  She was instructed in gait on level with a cane.  She declined gait on steps but we verbally  instructed her and gave her written instructions.  She was also given a home exercise program of isometrics and ROM for the left hip and instructed in same.  She appears to understand all teaching and feels comfortable about returning home tomorrow.  Her significant other will be with her.    Follow Up Recommendations No PT follow up    Equipment Recommendations  Cane (cane to be provided by Sherrian Divers)    Recommendations for Other Services   none    Precautions / Restrictions Precautions Precautions: Fall Restrictions Weight Bearing Restrictions: No      Mobility  Bed Mobility Overal bed mobility: Independent                Transfers Overall transfer level: Modified independent                  Ambulation/Gait Ambulation/Gait assistance: Supervision Ambulation Distance (Feet): 40 Feet Assistive device: Straight cane Gait Pattern/deviations: Antalgic;Decreased stance time - left   Gait velocity interpretation: Below normal speed for age/gender General Gait Details: gait is appropriate speed for trauma to the left hip...cane significantly improves gait performance  Stairs Stairs:  (pt declines due to pain)          Wheelchair Mobility    Modified Rankin (Stroke Patients Only)       Balance Overall balance assessment: Modified Independent  Pertinent Vitals/Pain Pain Assessment: 0-10 Pain Score: 7  Pain Location: left hip, mostly lateral Pain Descriptors / Indicators: Aching Pain Intervention(s): Repositioned;Monitored during session;Limited activity within patient's tolerance    Home Living Family/patient expects to be discharged to:: Private residence Living Arrangements: Spouse/significant other Available Help at Discharge: Family;Available PRN/intermittently Type of Home: House Home Access: Stairs to enter Entrance Stairs-Rails: Right Entrance Stairs-Number of Steps:  2 Home Layout: One level Home Equipment: None      Prior Function Level of Independence: Independent               Hand Dominance        Extremity/Trunk Assessment   Upper Extremity Assessment: Overall WFL for tasks assessed           Lower Extremity Assessment: LLE deficits/detail   LLE Deficits / Details: weakness primarily in gluteus medius and maximus due to pain (3-/5), iliopsoas strength=3/5  Cervical / Trunk Assessment: Normal  Communication   Communication: No difficulties  Cognition Arousal/Alertness: Awake/alert Behavior During Therapy: WFL for tasks assessed/performed Overall Cognitive Status: Within Functional Limits for tasks assessed                      General Comments      Exercises General Exercises - Lower Extremity Quad Sets: AROM;Both;10 reps;Supine Gluteal Sets: AROM;Both;10 reps;Supine Heel Slides: AROM;Left;10 reps;Supine Hip ABduction/ADduction:  (discussed doing standing hip abduction left at home)      Assessment/Plan    PT Assessment Patent does not need any further PT services  PT Diagnosis     PT Problem List    PT Treatment Interventions     PT Goals (Current goals can be found in the Care Plan section) Acute Rehab PT Goals PT Goal Formulation: No goals set, d/c therapy    Frequency     Barriers to discharge        Co-evaluation               End of Session Equipment Utilized During Treatment: Gait belt Activity Tolerance: Patient tolerated treatment well Patient left: in bed;with call bell/phone within reach;with nursing/sitter in room Nurse Communication: Mobility status         Time: 1345-1429 PT Time Calculation (min): 44 min   Charges:   PT Evaluation $Initial PT Evaluation Tier I: 1 Procedure PT Treatments $Gait Training: 8-22 mins $Self Care/Home Management: 8-22   PT G Codes:          Konrad Penta 01/02/2014, 2:49 PM
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
## 2014-01-02 NOTE — Progress Notes (Signed)
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 CK trending downward. She remains afebrile. Pain fairly well controlled with IV and po narcotics. She is resisting mobility.  She remains neurovascularly intact at this time and does not have any evidence of compartment syndrome. 1. Tramatic rhabdomyolysis. CK continues to trend downward. We'll continue with aggressive IV hydration but at a slightly lower rate. Renal function remains normal. This will be followed as well as serial CK levels. TSH 1.72, sed rate 6. 2. ?UTI: urine with Small leuks. 7-10 WBC, RBC 21-50. No dysuria, afebrile and non-toxic appearing. Monitor output. Provide antibiotic as indicated.  3. Hematuria: microscopic. Related to #2. Output good. Hg trending downward likely dilutional.  Monitor 4. Hyponatremia: resolved with IV fluids will monitor 5. Left hand pain: xray on 12/10/13 reveal nondisplaced fracture through the base of the proximal phalanx of the index finger. Small focal cleft like lucency in the dorsal aspect of the head of the long finger metacarpal concerning for a focal cortical impact fracture. Volar splint applied in ED on 12/10/13. She took splint off several weeks ago as it "was bothering her". Chart review indicates ED visits where she was instructed to wear splint. 6. Hx substance abuse: chart review indicates several visits to ED to acquire pain medicine. Suspect using left hand injury at that time for narcotics. Concern for same drug seeking behavior as result of #1. Will request SW consult. Some question if patient "falling" intentionally to cause injury as way to obtain narcotics vs abuse.    Code Status: full Family Communication: none Disposition Plan: home when ready, hopefully tomorrow   Consultants:  Dr Wandra Feinstein ortho on phone  01/01/14  Procedures:  none  Antibiotics:  none  HPI/Subjective: Awake. Reports pain improving but "still in a lot of pain". Able to rest, eat, take shower.   Objective: Filed Vitals:   01/02/14 0627  BP: 107/61  Pulse: 64  Temp: 98.2 F (36.8 C)  Resp: 20    Intake/Output Summary (Last 24 hours) at 01/02/14 0935 Last data filed at 01/01/14 1825  Gross per 24 hour  Intake 3542.5 ml  Output      0 ml  Net 3542.5 ml   Filed Weights   12/31/13 0741 12/31/13 1641  Weight: 47.628 kg (105 lb) 47.628 kg (105 lb)    Exam:   General:  Well nourished appears comfortable  Cardiovascular: RRR No MGR No LE edema  Respiratory: normal effort BS clear bilaterally  Abdomen: soft +BS non-distended non-tender to palpation  Musculoskeletal:  LE with full ROM. No swelling/erythema. Left arm with mild swelling, decreased ROM to left wrist.   Data Reviewed: Basic Metabolic Panel:  Recent Labs Lab 12/31/13 1128 01/01/14 0708 01/02/14 0536  NA 133* 140 141  K 4.1 4.0 4.4  CL 99 107 108  CO2 GLUCOSE 112* 107* 98  BUN 13 9 5*  CREATININE 0.68 0.60 0.61  CALCIUM 8.6 8.0* 8.3*   Liver Function Tests: No results found for this basename: AST, ALT, ALKPHOS, BILITOT, PROT, ALBUMIN,  in the last 168 hours No results found for this basename: LIPASE, AMYLASE,  in the last 168 hours No results found for this basename: AMMONIA,  in the last 168 hours CBC:  Recent Labs Lab 12/31/13 1128 01/01/14 0708 01/02/14 0536  WBC 9.5 5.4 5.0  NEUTROABS 5.8  --   --  HGB 12.7 11.1* 9.9*  HCT 37.0 33.0* 29.4*  MCV 91.6 91.9 91.3  PLT 239 182 172   Cardiac Enzymes:  Recent Labs Lab 12/31/13 1128 01/01/14 0708 01/02/14 0536  CKTOTAL 16109* 11756* 8722*   BNP (last 3 results) No results found for this basename: PROBNP,  in the last 8760 hours CBG: No results found for this basename: GLUCAP,  in the last 168 hours  No results found for this or any previous visit  (from the past 240 hour(s)).   Studies: Mr Pelvis W Wo Contrast  12/31/2013   CLINICAL DATA:  Left hip pain. Abnormal CT showing muscular and fascia all edema.  EXAM: MRI PELVIS WITHOUT AND WITH CONTRAST  TECHNIQUE: Multiplanar multisequence MR imaging of the pelvis was performed both before and after administration of intravenous contrast.  CONTRAST:  10mL MULTIHANCE GADOBENATE DIMEGLUMINE 529 MG/ML IV SOLN  COMPARISON:  12/31/2013 CT scan  FINDINGS: Severe edema of the left gluteus maximus, medius, and minimus muscles; and obturator internus muscle. Abnormal edema also tracks around and in some areas within the left piriformis, tensor fascia lata, sartorius, rectus femorals, and lateral portion of the iliopsoas. Abnormal edema tracks through the sciatic notch and into the pelvis and presacral space or crosses midline. There is extensive abnormal edema surrounding the left sciatic nerve both in the pelvis and in the vicinity of the left hip. The nerve itself does not appear obviously expanded. I do not see an impinging lesion along the sacral plexus although there is edema tracking along the left anterior sacrum.  On post-contrast images, I do not see an abscess, but there are areas of abnormal hypoenhancement in the left gluteus minimus, gluteus medius, lateral portion of the gluteus maximus muscle, and obturator internus muscles compatible with my necrosis.  Uterus and ovaries unremarkable. No osseous edema or findings of acute bony injury.  IMPRESSION: 1. Regions of myonecrosis in the gluteus minimus, gluteus medius, gluteus maximus, and obturator internus muscles with severe diffuse edema in these muscles, and also abnormal edema within and along portions of the left piriformis, tensor fascia lata, sartorius, rectus femorals, and iliopsoas muscles. Abnormal regional edema and enhancement/fasciitis tracks in the presacral space, in the sciatic notch, and around the left sciatic nerve. Appearance compatible  with severe myositis, potentially related to substance abuse, infection, or autoimmune disease. Posttraumatic myonecrosis related to a fall from a bed seems improbable, for example in light of the myonecrosis involving the intrapelvic portion of the obturator internus without adjacent bony fracture. The patient does not currently have leukocytosis.   Electronically Signed   By: Herbie Baltimore M.D.   On: 12/31/2013 14:12   Ct Hip Left Wo Contrast  12/31/2013   CLINICAL DATA:  Fall from bed.  Left hip pain.  EXAM: CT OF THE LEFT HIP WITHOUT CONTRAST  TECHNIQUE: Multidetector CT imaging of the left hip was performed according to the standard protocol. Multiplanar CT image reconstructions were also generated.  COMPARISON:  12/31/2013 radiographs; CT scan from 05/31/2008  FINDINGS: Mild spurring of the femoral head and of the acetabulum, increased from 2010 and mildly advanced for age. Penetrating cortical vessels appear to account for the cortical irregularities along the anterior and superior femoral neck base for example on image 27 of series 3. No well-defined fracture.  There is abnormal edema tracking around and perhaps within the gluteus medius, gluteus minimus, piriformis, and obturator internus muscles on the left. Edema tracks deep to the tensor fascia lata and iliotibial band. There  seems to be a small amount of gas in the gluteus maximus posteriorly. Small amount of presacral edema and edema tracking in the sciatic notch with poor definition of the sciatic nerve resulting.  No definite hip effusion.  Iliopsoas tendon intact.  IMPRESSION: 1. Unusual constellation of soft tissue findings. Abnormal edema within and tracking around the gluteus musculature, obturator internus (including the intrapelvic portion), iliotibial band, tensor fascia lata, piriformis muscle, sciatic notch, and in the presacral space. Small amount of gas in the gluteus maximus. Did the patient have an intramuscular injection or a  penetrating injury? An infectious process such as myositis and fasciitis cannot be excluded. Conceivably this appearance could be posttraumatic although muscular distribution of involvement is unusual for the reported fall from bed. MRI of the bony pelvis with and without contrast may be warranted to investigate the region further. 2. Advanced for age spurring along the left acetabulum and left proximal femur. Slight cortical tubular lucencies favor potential hyperemia in the left femoral neck region, but I do not see definite cortical discontinuity. Please note that today's exam was focused on the hip and did not scan the entire pelvis.   Electronically Signed   By: Herbie Baltimore M.D.   On: 12/31/2013 11:16    Scheduled Meds: . famotidine  20 mg Oral BID  . nicotine  21 mg Transdermal Daily   Continuous Infusions: . sodium chloride 150 mL/hr at 01/02/14 0754    Principal Problem:   Rhabdomyolysis Active Problems:   Myositis   Fall   Myonecrosis   Intractable pain   Left hand fracture   Substance abuse    Time spent: 35 minutes   [facility] M  [facility] Pager [phone]. If 7PM-7AM, please contact night-coverage at [email or web address], password [facility] 01/02/2014, 9:35 AM  LOS: 2 days

## 2014-01-03 LAB — CBC
HCT: 30.2 % — ABNORMAL LOW (ref 36.0–46.0)
Hemoglobin: 10.5 g/dL — ABNORMAL LOW (ref 12.0–15.0)
MCH: 31.2 pg (ref 26.0–34.0)
MCHC: 34.8 g/dL (ref 30.0–36.0)
MCV: 89.6 fL (ref 78.0–100.0)
Platelets: 193 10*3/uL (ref 150–400)
RBC: 3.37 MIL/uL — AB (ref 3.87–5.11)
RDW: 12.5 % (ref 11.5–15.5)
WBC: 5.3 10*3/uL (ref 4.0–10.5)

## 2014-01-03 LAB — CK: Total CK: 6178 U/L — ABNORMAL HIGH (ref 7–177)

## 2014-01-03 MED ORDER — OXYCODONE HCL 5 MG PO TABS: 5.0000 mg | ORAL_TABLET | ORAL | Status: DC | PRN

## 2014-01-03 NOTE — Discharge Summary (Signed)
Physician Discharge Summary  Chelsea May UEA:[id number] DOB: 1985-12-19 DOA: 12/31/2013  PCP: No PCP Per Patient  Admit date: 12/31/2013 Discharge date: 01/03/2014  Time spent: 40 minutes  Recommendations for Outpatient Follow-up:  1. Patient has plans to follow up in clinic in [place] once approved. Has been given information about [facility] clinic if this plan does not work out. Recommend follow up in 1-2 weeks for evaluation of pain   Discharge Diagnoses:  Principal Problem:   Rhabdomyolysis Active Problems:   Myositis   Fall   Myonecrosis   Intractable pain   Left hand fracture   Substance abuse   Discharge Condition: stable  Diet recommendation: regular  Filed Weights   12/31/13 0741 12/31/13 1641  Weight: 47.628 kg (105 lb) 47.628 kg (105 lb)    History of present illness:  Chelsea May is a 28 y.o. female with no reported past medical history who reported taking an over-the-counter sleep aid last night.  She reports falling to the ground from bed. She reported that her bed was significantly elevated with bedframe, box spring, and 2 mattresses. During her fall, she reported hitting her left thigh on a stool and her left arm hit a night table. The patient got back into bed, but within a few hours old contusion having significant pain in her left thigh and left arm. She noticed swelling. She denied any fever, erythema. She did not have any other problems prior to her fall. She was evaluated in the emergency room where x-rays were unrevealing for any underlying fractures. MRI of her pelvis to indicate significant myositis with areas of myonecrosis in her gluteal region. Case was discussed by Dr. Jodi Mourning with Dr. Sherlean Foot on-call for orthopedics at [facility]. Images were reviewed and history was discussed. recommendations were for patient to be admitted to the hospital for supportive therapy. Antibiotics are not recommended at this time since this does not appear  to be an infectious problem. Further IV hydration and pain management was recommended with formal orthopedic consult in a.m.    Hospital Course:  Myositis/myonecrosis. related to her fall. Admitted and provided with aggressive IV fluids.  CK trended downward and at discharge CK 6178 from 20331 on admission. She remained afebrile. Pain controlled with IV and po narcotics initially. Discharged with po narcotics and instructed to keep hydrated. She remained neurovascularly intact. No evidence of compartment syndrome. Evaluated by PT who recommended ambulation with cane. 1. Tramatic rhabdomyolysis. See #1. Renal function remained normal. TSH 1.72, sed rate 6. Recommend close follow OP follow up for resolution of rhabdo and management of pain.  2. ?UTI: urine with Small leuks. 7-10 WBC, RBC 21-50. No dysuria, afebrile and non-toxic appearing.  3. Hematuria: microscopic. Related to #2. Output good.  4. Hyponatremia: resolved with IV fluids 5. Left hand pain: xray on 12/10/13 reveal nondisplaced fracture through the base of the proximal phalanx of the index finger. Small focal cleft like lucency in the dorsal aspect of the head of the long finger metacarpal concerning for a focal cortical impact fracture. Volar splint applied in ED on 12/10/13. She took splint off several weeks ago as it "was bothering her". Chart review indicates ED visits where she was instructed to wear splint. She is in process of following up with clinic in [facility]. Referred her to [facility] free clinic if that does not work out 6. Hx substance abuse: chart review indicates several visits to ED to acquire pain medicine. Suspect using left hand  injury at that time for narcotics. Concern for same drug seeking behavior as result of #1. SW  Evaluation and pt denies any abuse.  Some question if patient "falling" intentionally to cause injury as way to obtain narcotics vs abuse. Of note, during discharge instructions patient requested "one more  pain shot before taking out IV".    Procedures:  none  Consultations:  none  Discharge Exam: Filed Vitals:   01/03/14 0559  BP: 115/70  Pulse: 76  Temp: 98.6 F (37 C)  Resp: 20    General: well nourished appears comfortable Cardiovascular: RRR NO MGR No LE edema Respiratory: normal effort BS clear bilaterally no wheeze MS: ambulates with cane without problem steady gait  Discharge Instructions You were cared for by a hospitalist during your hospital stay. If you have any questions about your discharge medications or the care you received while you were in the hospital after you are discharged, you can call the unit and asked to speak with the hospitalist on call if the hospitalist that took care of you is not available. Once you are discharged, your primary care physician will handle any further medical issues. Please note that NO REFILLS for any discharge medications will be authorized once you are discharged, as it is imperative that you return to your primary care physician (or establish a relationship with a primary care physician if you do not have one) for your aftercare needs so that they can reassess your need for medications and monitor your lab values.  Discharge Instructions   Diet - low sodium heart healthy    Complete by:  As directed      Increase activity slowly    Complete by:  As directed           Current Discharge Medication List    START taking these medications   Details  naproxen (NAPROSYN) 375 MG tablet Take 1 tablet (375 mg total) by mouth 2 (two) times daily. Qty: 15 tablet, Refills: 0    oxyCODONE (OXY IR/ROXICODONE) 5 MG immediate release tablet Take 1 tablet (5 mg total) by mouth every 2 (two) hours as needed for moderate pain. Qty: 20 tablet, Refills: 0      CONTINUE these medications which have NOT CHANGED   Details  megestrol (MEGACE) 20 MG tablet Take 60 mg by mouth 3 (three) times daily.    ranitidine (ZANTAC) 150 MG tablet Take  150 mg by mouth daily.       Allergies  Allergen Reactions  . Hydrocodone Nausea And Vomiting    Patient states that she gets a metal taste in her mouth.   Follow-up Information   Call Syliva Overman, MD. (As needed)    Specialty:  Family Medicine   Contact information:   [address] [place] [address] [phone]        The results of significant diagnostics from this hospitalization (including imaging, microbiology, ancillary and laboratory) are listed below for reference.    Significant Diagnostic Studies: Dg Cervical Spine Complete  12/08/2013   CLINICAL DATA:  Back pain.  Fall.  EXAM: CERVICAL SPINE  4+ VIEWS  COMPARISON:  CT 06/06/2010  FINDINGS: There is no evidence of cervical spine fracture or prevertebral soft tissue swelling. Alignment is normal. No other significant bone abnormalities are identified.  IMPRESSION: Negative cervical spine radiographs.   Electronically Signed   By: Charlett Nose M.D.   On: 12/08/2013 19:43   Dg Thoracic Spine 2 View  12/08/2013  CLINICAL DATA:  Pain  EXAM: THORACIC SPINE - 2 VIEW  COMPARISON:  08/21/2003  FINDINGS: There is no evidence of thoracic spine fracture. Alignment is normal. No other significant bone abnormalities are identified.  IMPRESSION: No acute abnormality noted.   Electronically Signed   By: Alcide Clever M.D.   On: 12/08/2013 19:43   Dg Lumbar Spine Complete  12/08/2013   CLINICAL DATA:  Back pain.  EXAM: LUMBAR SPINE - COMPLETE 4+ VIEW  COMPARISON:  Abdomen series 12/01/2011.  CT 01/28/2009.  FINDINGS: Soft tissue structures are unremarkable. Ring noted in the umbilicus. Normal bony alignment and mineralization. No acute bony abnormality identified.  IMPRESSION: No significant abnormality.   Electronically Signed   By: Maisie Fus  Register   On: 12/08/2013 19:44   Dg Forearm Left  12/31/2013   CLINICAL DATA:  Patient fell out of bed this morning. Pain localizing to the mid left forearm and left wrist.  Initial encounter.  EXAM: LEFT FOREARM - 2 VIEW  COMPARISON:  Left wrist x-rays obtained concurrently.  FINDINGS: No evidence of acute fracture involving the radius or ulna. No intrinsic osseous abnormality. Visualized elbow joint intact.  IMPRESSION: Normal examination.   Electronically Signed   By: Hulan Saas M.D.   On: 12/31/2013 08:53   Dg Wrist Complete Left  12/31/2013   CLINICAL DATA:  Patient fell out of bed this morning. Pain localizing to the mid left forearm and left wrist. Initial encounter.  EXAM: LEFT WRIST - COMPLETE 3+ VIEW  COMPARISON:  Left forearm x-rays obtained concurrently.  FINDINGS: No evidence of acute fracture or dislocation. Joint spaces well preserved. Well-preserved bone mineral density. No intrinsic osseous abnormalities.  IMPRESSION: Normal examination.   Electronically Signed   By: Hulan Saas M.D.   On: 12/31/2013 08:58   Dg Hip Complete Left  12/31/2013   CLINICAL DATA:  Patient fell out of bed this morning. Pain localizing to the left hip, mid left forearm and left wrist. Initial encounter.  EXAM: LEFT HIP - COMPLETE 2+ VIEW  COMPARISON:  None.  FINDINGS: No evidence of acute fracture or dislocation. Well preserved joint space. Well preserved bone mineral density. No intrinsic osseous abnormality.  Included AP pelvis demonstrates no fractures elsewhere. Sacroiliac joints and symphysis pubis intact. Contralateral right hip joint intact. Visualized lower lumbar spine intact.  IMPRESSION: Normal examination.   Electronically Signed   By: Hulan Saas M.D.   On: 12/31/2013 08:57   Mr Pelvis W Wo Contrast  12/31/2013   CLINICAL DATA:  Left hip pain. Abnormal CT showing muscular and fascia all edema.  EXAM: MRI PELVIS WITHOUT AND WITH CONTRAST  TECHNIQUE: Multiplanar multisequence MR imaging of the pelvis was performed both before and after administration of intravenous contrast.  CONTRAST:  10mL MULTIHANCE GADOBENATE DIMEGLUMINE 529 MG/ML IV SOLN  COMPARISON:   12/31/2013 CT scan  FINDINGS: Severe edema of the left gluteus maximus, medius, and minimus muscles; and obturator internus muscle. Abnormal edema also tracks around and in some areas within the left piriformis, tensor fascia lata, sartorius, rectus femorals, and lateral portion of the iliopsoas. Abnormal edema tracks through the sciatic notch and into the pelvis and presacral space or crosses midline. There is extensive abnormal edema surrounding the left sciatic nerve both in the pelvis and in the vicinity of the left hip. The nerve itself does not appear obviously expanded. I do not see an impinging lesion along the sacral plexus although there is edema tracking along the left anterior sacrum.  On post-contrast images, I do not see an abscess, but there are areas of abnormal hypoenhancement in the left gluteus minimus, gluteus medius, lateral portion of the gluteus maximus muscle, and obturator internus muscles compatible with my necrosis.  Uterus and ovaries unremarkable. No osseous edema or findings of acute bony injury.  IMPRESSION: 1. Regions of myonecrosis in the gluteus minimus, gluteus medius, gluteus maximus, and obturator internus muscles with severe diffuse edema in these muscles, and also abnormal edema within and along portions of the left piriformis, tensor fascia lata, sartorius, rectus femorals, and iliopsoas muscles. Abnormal regional edema and enhancement/fasciitis tracks in the presacral space, in the sciatic notch, and around the left sciatic nerve. Appearance compatible with severe myositis, potentially related to substance abuse, infection, or autoimmune disease. Posttraumatic myonecrosis related to a fall from a bed seems improbable, for example in light of the myonecrosis involving the intrapelvic portion of the obturator internus without adjacent bony fracture. The patient does not currently have leukocytosis.   Electronically Signed   By: Herbie Baltimore M.D.   On: 12/31/2013 14:12    Ct Hip Left Wo Contrast  12/31/2013   CLINICAL DATA:  Fall from bed.  Left hip pain.  EXAM: CT OF THE LEFT HIP WITHOUT CONTRAST  TECHNIQUE: Multidetector CT imaging of the left hip was performed according to the standard protocol. Multiplanar CT image reconstructions were also generated.  COMPARISON:  12/31/2013 radiographs; CT scan from 05/31/2008  FINDINGS: Mild spurring of the femoral head and of the acetabulum, increased from 2010 and mildly advanced for age. Penetrating cortical vessels appear to account for the cortical irregularities along the anterior and superior femoral neck base for example on image 27 of series 3. No well-defined fracture.  There is abnormal edema tracking around and perhaps within the gluteus medius, gluteus minimus, piriformis, and obturator internus muscles on the left. Edema tracks deep to the tensor fascia lata and iliotibial band. There seems to be a small amount of gas in the gluteus maximus posteriorly. Small amount of presacral edema and edema tracking in the sciatic notch with poor definition of the sciatic nerve resulting.  No definite hip effusion.  Iliopsoas tendon intact.  IMPRESSION: 1. Unusual constellation of soft tissue findings. Abnormal edema within and tracking around the gluteus musculature, obturator internus (including the intrapelvic portion), iliotibial band, tensor fascia lata, piriformis muscle, sciatic notch, and in the presacral space. Small amount of gas in the gluteus maximus. Did the patient have an intramuscular injection or a penetrating injury? An infectious process such as myositis and fasciitis cannot be excluded. Conceivably this appearance could be posttraumatic although muscular distribution of involvement is unusual for the reported fall from bed. MRI of the bony pelvis with and without contrast may be warranted to investigate the region further. 2. Advanced for age spurring along the left acetabulum and left proximal femur. Slight cortical  tubular lucencies favor potential hyperemia in the left femoral neck region, but I do not see definite cortical discontinuity. Please note that today's exam was focused on the hip and did not scan the entire pelvis.   Electronically Signed   By: Herbie Baltimore M.D.   On: 12/31/2013 11:16   Dg Hand Complete Left  12/09/2013   CLINICAL DATA:  Dorsal hand pain, fell getting out of bed  EXAM: LEFT HAND - COMPLETE 3+ VIEW  COMPARISON:  None.  FINDINGS: Acute minimally displaced fracture through the base of the proximal phalanx of the index finger. Additionally, there is a  focal lucency in the dorsal aspect of the head of the long finger metacarpal concerning for a focal impacted fracture. Associated soft tissue swelling dorsally overlying the MCP joint. No other acute fracture or abnormality is identified. Normal bony mineralization.  IMPRESSION: 1. Nondisplaced fracture through the base of the proximal phalanx of the index finger. 2. Small focal cleft like lucency in the dorsal aspect of the head of the long finger metacarpal concerning for a focal cortical impact fracture.   Electronically Signed   By: Malachy Moan M.D.   On: 12/09/2013 15:20    Microbiology: No results found for this or any previous visit (from the past 240 hour(s)).   Labs: Basic Metabolic Panel:  Recent Labs Lab 12/31/13 1128 01/01/14 0708 01/02/14 0536  NA 133* 140 141  K 4.1 4.0 4.4  CL 99 107 108  CO2 GLUCOSE 112* 107* 98  BUN 13 9 5*  CREATININE 0.68 0.60 0.61  CALCIUM 8.6 8.0* 8.3*   Liver Function Tests: No results found for this basename: AST, ALT, ALKPHOS, BILITOT, PROT, ALBUMIN,  in the last 168 hours No results found for this basename: LIPASE, AMYLASE,  in the last 168 hours No results found for this basename: AMMONIA,  in the last 168 hours CBC:  Recent Labs Lab 12/31/13 1128 01/01/14 0708 01/02/14 0536 01/03/14 0522  WBC 9.5 5.4 5.0 5.3  NEUTROABS 5.8  --   --   --   HGB 12.7 11.1*  9.9* 10.5*  HCT 37.0 33.0* 29.4* 30.2*  MCV 91.6 91.9 91.3 89.6  PLT 239 182 172 193   Cardiac Enzymes:  Recent Labs Lab 12/31/13 1128 01/01/14 0708 01/02/14 0536 01/03/14 0522  CKTOTAL 78295* 11756* 8722* 6178*   BNP: BNP (last 3 results) No results found for this basename: PROBNP,  in the last 8760 hours CBG: No results found for this basename: GLUCAP,  in the last 168 hours     Signed:  Gwenyth Bender  [facility] 01/03/2014, 1:07 PM

## 2014-01-03 NOTE — Care Management Note (Signed)
    Page 1 of 1   01/03/2014     4:47:48 PM CARE MANAGEMENT NOTE 01/03/2014  Patient:  Chelsea May, Chelsea May   Account Number:  [id number]  Date Initiated:  01/03/2014  Documentation initiated by:  Anibal Henderson  Subjective/Objective Assessment:   Admitted with Rabdo. Pt states she fell out of the bed, and fell onto her side, causing bain and brusing.     Action/Plan:   pt is from home , is independent and will return home at D/C   Anticipated DC Date:  01/03/2014   Anticipated DC Plan:  HOME/SELF CARE  In-house referral  Clinical Social Worker      DC Planning Services  CM consult      Choice offered to / List presented to:             Status of service:  Completed, signed off Medicare Important Message given?   (If response is "NO", the following Medicare IM given date fields will be blank) Date Medicare IM given:   Medicare IM given by:   Date Additional Medicare IM given:   Additional Medicare IM given by:    Discharge Disposition:  HOME/SELF CARE  Per UR Regulation:  Reviewed for med. necessity/level of care/duration of stay  If discussed at Long Length of Stay Meetings, dates discussed:    Comments:  01/03/14 1630 Anibal Henderson RN/CM

## 2014-01-03 NOTE — Discharge Summary (Signed)
I have directly reviewed the clinical findings, lab, imaging studies and management of this patient in detail. I have interviewed and examined the patient and agree with the documentation,  as recorded by the Physician extender, Ms. Toya Smothers, NP.  In summary This is a 28 year old female who presented after falling out of bed. Patient had suffered traumatic rhabdomyolysis, however her renal function remains stable. Patient was placed on IV fluids, and her CPK levels and did trend downward. Patient was also noted to have a nondisplaced fracture of her left hand. It was thought that patient would be better off with conservative management instead of surgical intervention. Patient does have a history of substance abuse and she has had several ER visits seeking pain medication and narcotics. Her urine drug screen was also positive for marijuana, benzodiazepines and opiates. Social work was consulted for possible abuse however patient states that she simply fell out of bed. Patient is to followup with your primary care physician[facility]. Patient plans to followup with a clinic in [facility]. Patient was discharged with a finite amount of pain medication.  Patient was seen examined on day of discharge General: Well-developed, well-nourished, no apparent distress Cardiovascular: Normal S1, S2, regular Respiratory: Clear to auscultation bilaterally Neurology: AAO x3, no focal deficits  Total patient care time: Greater than 40 minutes  Sarika Baldini D.O. on 01/03/2014 at 4:14 PM  [facility] Office  [phone]

## 2014-01-03 NOTE — Progress Notes (Signed)
Chelsea May discharged home per MD order.  Discharge instructions reviewed and discussed with the patient, all questions and concerns answered. Copy of instructions and scripts given to patient.    Medication List         megestrol 20 MG tablet  Commonly known as:  MEGACE  Take 60 mg by mouth 3 (three) times daily.     naproxen 375 MG tablet  Commonly known as:  NAPROSYN  Take 1 tablet (375 mg total) by mouth 2 (two) times daily.     oxyCODONE 5 MG immediate release tablet  Commonly known as:  Oxy IR/ROXICODONE  Take 1 tablet (5 mg total) by mouth every 2 (two) hours as needed for moderate pain.     ranitidine 150 MG tablet  Commonly known as:  ZANTAC  Take 150 mg by mouth daily.        Patients skin is clean, dry and intact, no evidence of skin break down. IV site discontinued and catheter remains intact. Site without signs and symptoms of complications. Dressing and pressure applied.  Patient escorted to car by Lucendia Herrlich, NT in a wheelchair,  no distress noted upon discharge.  Ubaldo Glassing 01/03/2014 3:12 PM

## 2014-01-03 NOTE — Progress Notes (Signed)
Pt receiving Oxy IR  every 2 hours throughout the night for c/o hip pain. Pt complained that her pain was unbearable and asked that the nurse to page MD to get a one time order for Dilaudid. MD paged with no new orders given at this time.

## 2014-01-13 ENCOUNTER — Encounter (HOSPITAL_COMMUNITY): Payer: Self-pay | Admitting: Emergency Medicine

## 2014-01-13 ENCOUNTER — Emergency Department (HOSPITAL_COMMUNITY)
Admission: EM | Admit: 2014-01-13 | Discharge: 2014-01-13 | Disposition: A | Discharge status: Home / Self Care | Payer: Self-pay | Attending: Emergency Medicine | Admitting: Emergency Medicine

## 2014-01-13 DIAGNOSIS — Z8739 Personal history of other diseases of the musculoskeletal system and connective tissue: Secondary | ICD-10-CM | POA: Insufficient documentation

## 2014-01-13 DIAGNOSIS — R000 Tachycardia, unspecified: Secondary | ICD-10-CM | POA: Insufficient documentation

## 2014-01-13 DIAGNOSIS — Z720 Tobacco use: Secondary | ICD-10-CM | POA: Insufficient documentation

## 2014-01-13 DIAGNOSIS — Z79899 Other long term (current) drug therapy: Secondary | ICD-10-CM | POA: Insufficient documentation

## 2014-01-13 DIAGNOSIS — K047 Periapical abscess without sinus: Secondary | ICD-10-CM | POA: Insufficient documentation

## 2014-01-13 MED ORDER — AMOXICILLIN 250 MG PO CAPS
500.0000 mg | ORAL_CAPSULE | Freq: Once | ORAL | Status: AC
  Administered 2014-01-13: 500 mg via ORAL
  Filled 2014-01-13: qty 2

## 2014-01-13 MED ORDER — OXYCODONE HCL 5 MG PO TABS: 5.0000 mg | ORAL_TABLET | ORAL | Status: DC | PRN

## 2014-01-13 MED ORDER — AMOXICILLIN 500 MG PO CAPS: 500.0000 mg | ORAL_CAPSULE | Freq: Three times a day (TID) | ORAL | Status: DC

## 2014-01-13 MED ORDER — OXYCODONE HCL 5 MG PO TABS
5.0000 mg | ORAL_TABLET | Freq: Once | ORAL | Status: AC
  Administered 2014-01-13: 5 mg via ORAL
  Filled 2014-01-13: qty 1

## 2014-01-13 NOTE — Discharge Instructions (Signed)

## 2014-01-13 NOTE — ED Notes (Signed)
Pt states that she began having jaw pain yesterday. Pt states that pain is around crown in lower right tooth (crown done 10+ years ago). Pt has been using ice and Kanka Softbrush Gel on the gum surrounding the crown with no relief.

## 2014-01-13 NOTE — ED Provider Notes (Signed)
CSN: [id number]     Arrival date & time 01/13/14  2158 History   First MD Initiated Contact with Patient 01/13/14 2219     Chief Complaint  Patient presents with  . Jaw Pain     (Consider location/radiation/quality/duration/timing/severity/associated sxs/prior Treatment) Patient is a 28 y.o. female presenting with tooth pain. The history is provided by the patient.  Dental Pain Location:  Lower Lower teeth location:  18/LL 2nd molar Quality:  Throbbing Severity:  Severe Onset quality:  Gradual Duration:  2 days Timing:  Constant Progression:  Worsening Chronicity:  New Context: abscess   Relieved by:  Nothing Worsened by:  Cold food/drink, touching and pressure Risk factors: smoking    Envi Chelsea May is a 28 y.o. female who presents to the ED with dental pain that started 2 days ago. She states that it is the   Past Medical History  Diagnosis Date  . Substance abuse     opiates, detox 05/2011  . Heart murmur   . Hx of nausea and vomiting   . Rhabdomyolysis   . Myonecrosis   . Hematuria    Past Surgical History  Procedure Laterality Date  . Back surgery      "tumor" removed from her back   History reviewed. No pertinent family history. History  Substance Use Topics  . Smoking status: Current Every Day Smoker -- 1.00 packs/day    Types: Cigarettes  . Smokeless tobacco: Not on file  . Alcohol Use: No   OB History   Grav Para Term Preterm Abortions TAB SAB Ect Mult Living   2 2 2             Review of Systems Negative except as stated in HPI   Allergies  Hydrocodone and Tomato  Home Medications   Prior to Admission medications   Medication Sig Start Date End Date Taking? Authorizing Provider  ranitidine (ZANTAC) 150 MG tablet Take 150 mg by mouth daily.   Yes Historical Provider, MD   BP 104/62  Pulse 112  Temp(Src) 98.9 F (37.2 C) (Oral)  Resp 18  Ht 5\' 2"  (1.575 m)  Wt 120 lb 6.4 oz (54.613 kg)  BMI 22.02 kg/m2  SpO2 100%  LMP  12/30/2013 Physical Exam  Nursing note and vitals reviewed. Constitutional: She is oriented to person, place, and time. She appears well-developed and well-nourished.  HENT:  Head: Normocephalic.  Mouth/Throat:    Lower right second molar with silver crown. Erythema and swelling noted to the gum surrounding the tooth. Tender on exam.   Eyes: EOM are normal.  Neck: Normal range of motion. Neck supple.  Cardiovascular: Tachycardia present.   Pulmonary/Chest: Effort normal.  Musculoskeletal: Normal range of motion.  Lymphadenopathy:    She has cervical adenopathy (right).  Neurological: She is alert and oriented to person, place, and time. No cranial nerve deficit.  Skin: Skin is warm and dry.  Psychiatric: She has a normal mood and affect. Her behavior is normal.    ED Course  Procedures   MDM  28 y.o. female with dental abscess and pain. Will treat for pain and infection. She will see a dentist as soon as possible. She will return here as needed.  Discussed with the patient and all questioned fully answered.    Medication List    TAKE these medications       amoxicillin 500 MG capsule  Commonly known as:  AMOXIL  Take 1 capsule (500 mg total) by mouth 3 (three) times  daily.     oxyCODONE 5 MG immediate release tablet  Commonly known as:  ROXICODONE  Take 1 tablet (5 mg total) by mouth every 4 (four) hours as needed for severe pain.      ASK your doctor about these medications       ranitidine 150 MG tablet  Commonly known as:  ZANTAC  Take 150 mg by mouth daily.            Chelsea May, [place] 01/13/14 2255

## 2014-01-15 NOTE — ED Provider Notes (Signed)
Medical screening examination/treatment/procedure(s) were performed by non-physician practitioner and as supervising physician I was immediately available for consultation/collaboration.   EKG Interpretation None       Chelsea May M Chelsea Eskelson, MD 01/15/14 1709 

## 2014-02-03 ENCOUNTER — Encounter (HOSPITAL_COMMUNITY): Payer: Self-pay | Admitting: Emergency Medicine

## 2014-02-03 ENCOUNTER — Emergency Department (HOSPITAL_COMMUNITY)
Admission: EM | Admit: 2014-02-03 | Discharge: 2014-02-03 | Discharge status: Against Medical Advice | Payer: Self-pay | Attending: Emergency Medicine | Admitting: Emergency Medicine

## 2014-02-03 DIAGNOSIS — R1084 Generalized abdominal pain: Secondary | ICD-10-CM | POA: Insufficient documentation

## 2014-02-03 DIAGNOSIS — Z720 Tobacco use: Secondary | ICD-10-CM | POA: Insufficient documentation

## 2014-02-03 DIAGNOSIS — R197 Diarrhea, unspecified: Secondary | ICD-10-CM | POA: Insufficient documentation

## 2014-02-03 DIAGNOSIS — R112 Nausea with vomiting, unspecified: Secondary | ICD-10-CM | POA: Insufficient documentation

## 2014-02-03 MED ORDER — ONDANSETRON 4 MG PO TBDP
4.0000 mg | ORAL_TABLET | Freq: Once | ORAL | Status: AC
  Administered 2014-02-03: 4 mg via ORAL
  Filled 2014-02-03: qty 1

## 2014-02-03 NOTE — ED Notes (Signed)
Patient c/o generalized abd pain with nausea, vomiting, and diarrhea that started this morning. Denies any pain with urination. Patient unsure of any fevers.

## 2014-02-03 NOTE — ED Notes (Signed)
Registration reports that pt and pt's friend left and has not returned for past 25 minutes

## 2014-02-03 NOTE — ED Notes (Signed)
Pt has not returned

## 2014-02-05 ENCOUNTER — Emergency Department (HOSPITAL_COMMUNITY)
Admission: EM | Admit: 2014-02-05 | Discharge: 2014-02-05 | Disposition: A | Discharge status: Home / Self Care | Payer: Self-pay | Attending: Emergency Medicine | Admitting: Emergency Medicine

## 2014-02-05 ENCOUNTER — Encounter (HOSPITAL_COMMUNITY): Payer: Self-pay | Admitting: Emergency Medicine

## 2014-02-05 DIAGNOSIS — Z3202 Encounter for pregnancy test, result negative: Secondary | ICD-10-CM | POA: Insufficient documentation

## 2014-02-05 DIAGNOSIS — R112 Nausea with vomiting, unspecified: Secondary | ICD-10-CM

## 2014-02-05 DIAGNOSIS — Z720 Tobacco use: Secondary | ICD-10-CM | POA: Insufficient documentation

## 2014-02-05 DIAGNOSIS — R000 Tachycardia, unspecified: Secondary | ICD-10-CM | POA: Insufficient documentation

## 2014-02-05 DIAGNOSIS — Z79899 Other long term (current) drug therapy: Secondary | ICD-10-CM | POA: Insufficient documentation

## 2014-02-05 DIAGNOSIS — Z8739 Personal history of other diseases of the musculoskeletal system and connective tissue: Secondary | ICD-10-CM | POA: Insufficient documentation

## 2014-02-05 LAB — CBC WITH DIFFERENTIAL/PLATELET
BASOS PCT: 0 % (ref 0–1)
Basophils Absolute: 0 10*3/uL (ref 0.0–0.1)
EOS ABS: 0 10*3/uL (ref 0.0–0.7)
Eosinophils Relative: 0 % (ref 0–5)
HCT: 37.8 % (ref 36.0–46.0)
Hemoglobin: 12.9 g/dL (ref 12.0–15.0)
Lymphocytes Relative: 32 % (ref 12–46)
Lymphs Abs: 2.2 10*3/uL (ref 0.7–4.0)
MCH: 31 pg (ref 26.0–34.0)
MCHC: 34.1 g/dL (ref 30.0–36.0)
MCV: 90.9 fL (ref 78.0–100.0)
MONOS PCT: 7 % (ref 3–12)
Monocytes Absolute: 0.5 10*3/uL (ref 0.1–1.0)
Neutro Abs: 4 10*3/uL (ref 1.7–7.7)
Neutrophils Relative %: 61 % (ref 43–77)
PLATELETS: 231 10*3/uL (ref 150–400)
RBC: 4.16 MIL/uL (ref 3.87–5.11)
RDW: 12.6 % (ref 11.5–15.5)
WBC: 6.7 10*3/uL (ref 4.0–10.5)

## 2014-02-05 LAB — URINALYSIS, ROUTINE W REFLEX MICROSCOPIC
Bilirubin Urine: NEGATIVE
Glucose, UA: NEGATIVE mg/dL
Ketones, ur: NEGATIVE mg/dL
LEUKOCYTES UA: NEGATIVE
Nitrite: NEGATIVE
PH: 6.5 (ref 5.0–8.0)
SPECIFIC GRAVITY, URINE: 1.02 (ref 1.005–1.030)
UROBILINOGEN UA: 0.2 mg/dL (ref 0.0–1.0)

## 2014-02-05 LAB — URINE MICROSCOPIC-ADD ON

## 2014-02-05 LAB — BASIC METABOLIC PANEL
Anion gap: 12 (ref 5–15)
BUN: 14 mg/dL (ref 6–23)
CALCIUM: 8.7 mg/dL (ref 8.4–10.5)
CO2: 24 mEq/L (ref 19–32)
Chloride: 105 mEq/L (ref 96–112)
Creatinine, Ser: 0.61 mg/dL (ref 0.50–1.10)
GFR calc Af Amer: >90 mL/min (ref >90)
GLUCOSE: 107 mg/dL — AB (ref 70–99)
Potassium: 3.5 mEq/L — ABNORMAL LOW (ref 3.7–5.3)
SODIUM: 141 meq/L (ref 137–147)

## 2014-02-05 LAB — PREGNANCY, URINE: Preg Test, Ur: NEGATIVE

## 2014-02-05 LAB — CK: Total CK: 41 U/L (ref 7–177)

## 2014-02-05 MED ORDER — ONDANSETRON HCL 4 MG/2ML IJ SOLN
4.0000 mg | Freq: Once | INTRAMUSCULAR | Status: AC
  Administered 2014-02-05: 4 mg via INTRAVENOUS

## 2014-02-05 MED ORDER — LORAZEPAM 2 MG/ML IJ SOLN
1.0000 mg | Freq: Once | INTRAMUSCULAR | Status: AC
  Administered 2014-02-05: 1 mg via INTRAVENOUS
  Filled 2014-02-05: qty 1

## 2014-02-05 MED ORDER — METOCLOPRAMIDE HCL 10 MG PO TABS: 10.0000 mg | ORAL_TABLET | Freq: Four times a day (QID) | ORAL | Status: DC | PRN

## 2014-02-05 MED ORDER — SODIUM CHLORIDE 0.9 % IV BOLUS (SEPSIS)
1000.0000 mL | Freq: Once | INTRAVENOUS | Status: AC
  Administered 2014-02-05: 1000 mL via INTRAVENOUS

## 2014-02-05 MED ORDER — ONDANSETRON HCL 4 MG/2ML IJ SOLN
INTRAMUSCULAR | Status: AC
  Filled 2014-02-05: qty 2

## 2014-02-05 MED ORDER — METOCLOPRAMIDE HCL 5 MG/ML IJ SOLN
10.0000 mg | Freq: Once | INTRAMUSCULAR | Status: AC
  Administered 2014-02-05: 10 mg via INTRAVENOUS
  Filled 2014-02-05: qty 2

## 2014-02-05 MED ORDER — PROMETHAZINE HCL 25 MG/ML IJ SOLN
25.0000 mg | Freq: Once | INTRAMUSCULAR | Status: AC
  Administered 2014-02-05: 25 mg via INTRAVENOUS
  Filled 2014-02-05: qty 1

## 2014-02-05 NOTE — Discharge Instructions (Signed)
No more marijuana.

## 2014-02-05 NOTE — ED Notes (Signed)
N/V for 3 days,  Pt came to ER 10/24 but left prior to tx.  No diarrhea. Mouth dry.

## 2014-02-05 NOTE — ED Notes (Signed)
Patient was sleeping when I went into room for vital signs. Patient awoke requesting pain medication and nausea medicine.  I advised the patient that the nurse ask me to relay the message that it was not time for her medicines.  Patient went back to sleep while I was obtaining her vital signs.

## 2014-02-05 NOTE — ED Provider Notes (Signed)
CSN: [id number]     Arrival date & time 02/05/14  1237 History  This chart was scribed for American Expressathan R. Rubin PayorPickering, MD by Annye AsaAnna Dorsett, ED Scribe. This patient was seen in room [facility]10[facility]10 and the patient's care was started at 1:03 PM.    Chief Complaint  Patient presents with  . Emesis   The history is provided by the patient. No language interpreter was used.    HPI Comments: Chelsea May is a 28 y.o. female who presents to the Emergency Department complaining of nausea and vomiting for the past 3 days. She reports that she cannot eat without vomiting; she also has difficulty urinating, which she believes is due to dehydration. She denies diarrhea and sick contacts.   Patient explains that she has "spells" similar to this every few months; she was told by a physician at one point that it could be brought on by smoking marijuana. She reports that she was a frequent user from age 28 to six months PTA; she has not had a vomiting spell in the past six months, but she used 4-5 days ago and began vomiting 3 days ago.   Past Medical History  Diagnosis Date  . Substance abuse     opiates, detox 05/2011  . Heart murmur   . Hx of nausea and vomiting   . Rhabdomyolysis   . Myonecrosis   . Hematuria    Past Surgical History  Procedure Laterality Date  . Back surgery      "tumor" removed from her back   History reviewed. No pertinent family history. History  Substance Use Topics  . Smoking status: Current Every Day Smoker -- 1.00 packs/day for 10 years    Types: Cigarettes  . Smokeless tobacco: Never Used  . Alcohol Use: No   OB History   Grav Para Term Preterm Abortions TAB SAB Ect Mult Living   2 2 2       2      Review of Systems  Gastrointestinal: Positive for nausea and vomiting.  All other systems reviewed and are negative.     Allergies  Hydrocodone and Tomato  Home Medications   Prior to Admission medications   Medication Sig Start Date End Date Taking?  Authorizing Provider  oxyCODONE (ROXICODONE) 5 MG immediate release tablet Take 1 tablet (5 mg total) by mouth every 4 (four) hours as needed for severe pain. 01/13/14  Yes Hope Orlene OchM Neese, NP  ranitidine (ZANTAC) 150 MG tablet Take 150 mg by mouth daily.   Yes Historical Provider, MD  metoCLOPramide (REGLAN) 10 MG tablet Take 1 tablet (10 mg total) by mouth every 6 (six) hours as needed for nausea or vomiting. 02/05/14   Juliet RudeNathan R. Averi Kilty, MD   BP 95/50  Pulse 79  Temp(Src) 97.9 F (36.6 C) (Oral)  Resp 16  Ht 5\' 2"  (1.575 m)  Wt 110 lb (49.896 kg)  BMI 20.11 kg/m2  SpO2 98%  LMP 02/04/2014 Physical Exam  Nursing note and vitals reviewed. Constitutional: She is oriented to person, place, and time. She appears well-developed and well-nourished.  HENT:  Head: Normocephalic and atraumatic.  Mouth/Throat: Oropharynx is clear and moist. No oropharyngeal exudate.  Eyes: EOM are normal. Pupils are equal, round, and reactive to light.  Cardiovascular: Normal rate and regular rhythm.   Pulmonary/Chest: Effort normal. No respiratory distress.  Abdominal: Soft. She exhibits no mass. There is no tenderness. There is no rebound and no guarding.  Musculoskeletal: Normal range of motion. She exhibits  no edema.  Neurological: She is alert and oriented to person, place, and time.  Skin: Skin is warm and dry. No rash noted.  Psychiatric: She has a normal mood and affect. Her behavior is normal.    ED Course  Procedures   DIAGNOSTIC STUDIES: Oxygen Saturation is 100% on RA, normal by my interpretation.    COORDINATION OF CARE: 1:04 PM Discussed clinical suspicion of cannabinoid hyperemesis; explained cessation of marijuana usage as a treatment plan. Patient agreed.   Labs Review Labs Reviewed  URINALYSIS, ROUTINE W REFLEX MICROSCOPIC - Abnormal; Notable for the following:    Hgb urine dipstick LARGE (*)    Protein, ur TRACE (*)    All other components within normal limits  URINE  MICROSCOPIC-ADD ON - Abnormal; Notable for the following:    Squamous Epithelial / LPF MANY (*)    Bacteria, UA FEW (*)    All other components within normal limits  BASIC METABOLIC PANEL - Abnormal; Notable for the following:    Potassium 3.5 (*)    Glucose, Bld 107 (*)    All other components within normal limits  PREGNANCY, URINE  CBC WITH DIFFERENTIAL  CK    Imaging Review No results found.   EKG Interpretation None      MDM   Final diagnoses:  Non-intractable vomiting with nausea, vomiting of unspecified type    Patient with nausea and vomiting. Likely can adenoid hyperemesis syndrome. Had been heavy user and had frequent episodes of vomiting, she has been clean for 6 months but then started to use again. The next day she had persistent vomiting. Improved after treatment, however it took Reglan to get her feeling better. Had 2 L of IV fluid. Lab work done due to recent rhabdo. Patient feels better and be discharged home  I personally performed the services described in this documentation, which was scribed in my presence. The recorded information has been reviewed and is accurate.        Juliet RudeNathan R. Rubin PayorPickering, MD 02/05/14 [id number]

## 2014-02-12 ENCOUNTER — Encounter (HOSPITAL_COMMUNITY): Payer: Self-pay | Admitting: Emergency Medicine

## 2014-03-09 ENCOUNTER — Emergency Department (HOSPITAL_COMMUNITY)
Admission: EM | Admit: 2014-03-09 | Discharge: 2014-03-09 | Disposition: A | Discharge status: Home / Self Care | Payer: Self-pay | Attending: Emergency Medicine | Admitting: Emergency Medicine

## 2014-03-09 ENCOUNTER — Encounter (HOSPITAL_COMMUNITY): Payer: Self-pay | Admitting: *Deleted

## 2014-03-09 DIAGNOSIS — K088 Other specified disorders of teeth and supporting structures: Secondary | ICD-10-CM | POA: Insufficient documentation

## 2014-03-09 DIAGNOSIS — Z720 Tobacco use: Secondary | ICD-10-CM | POA: Insufficient documentation

## 2014-03-09 DIAGNOSIS — K0889 Other specified disorders of teeth and supporting structures: Secondary | ICD-10-CM

## 2014-03-09 DIAGNOSIS — Z79899 Other long term (current) drug therapy: Secondary | ICD-10-CM | POA: Insufficient documentation

## 2014-03-09 DIAGNOSIS — R011 Cardiac murmur, unspecified: Secondary | ICD-10-CM | POA: Insufficient documentation

## 2014-03-09 DIAGNOSIS — Z8739 Personal history of other diseases of the musculoskeletal system and connective tissue: Secondary | ICD-10-CM | POA: Insufficient documentation

## 2014-03-09 MED ORDER — IBUPROFEN 600 MG PO TABS: ORAL_TABLET | ORAL | Status: DC

## 2014-03-09 MED ORDER — IBUPROFEN 800 MG PO TABS
800.0000 mg | ORAL_TABLET | Freq: Once | ORAL | Status: AC
  Administered 2014-03-09: 800 mg via ORAL
  Filled 2014-03-09: qty 1

## 2014-03-09 MED ORDER — AMOXICILLIN 500 MG PO CAPS: 500.0000 mg | ORAL_CAPSULE | Freq: Three times a day (TID) | ORAL | Status: DC

## 2014-03-09 MED ORDER — AMOXICILLIN 250 MG PO CAPS
500.0000 mg | ORAL_CAPSULE | Freq: Once | ORAL | Status: AC
  Administered 2014-03-09: 500 mg via ORAL
  Filled 2014-03-09: qty 2

## 2014-03-09 MED ORDER — ACETAMINOPHEN-CODEINE #3 300-30 MG PO TABS: 1.0000 | ORAL_TABLET | Freq: Four times a day (QID) | ORAL | Status: DC | PRN

## 2014-03-09 NOTE — ED Notes (Signed)
Pt states she was eating pork cracklings and she felt pain in several teeth in her right upper in lower jaw.

## 2014-03-09 NOTE — Discharge Instructions (Signed)
Dental Pain  Toothache is pain in or around a tooth. It may get worse with chewing or with cold or heat.   HOME CARE  · Your dentist may use a numbing medicine during treatment. If so, you may need to avoid eating until the medicine wears off. Ask your dentist about this.  · Only take medicine as told by your dentist or doctor.  · Avoid chewing food near the painful tooth until after all treatment is done. Ask your dentist about this.  GET HELP RIGHT AWAY IF:   · The problem gets worse or new problems appear.  · You have a fever.  · There is redness and puffiness (swelling) of the face, jaw, or neck.  · You cannot open your mouth.  · There is pain in the jaw.  · There is very bad pain that is not helped by medicine.  MAKE SURE YOU:   · Understand these instructions.  · Will watch your condition.  · Will get help right away if you are not doing well or get worse.  Document Released: 09/16/2007 Document Revised: 06/22/2011 Document Reviewed: 09/16/2007  [employer]® Patient Information ©2015 [employer]. This information is not intended to replace advice given to you by your health care provider. Make sure you discuss any questions you have with your health care provider.

## 2014-03-09 NOTE — ED Provider Notes (Signed)
CSN: [id number]     Arrival date & time 03/09/14  1641 History   First MD Initiated Contact with Patient 03/09/14 1752     Chief Complaint  Patient presents with  . Dental Pain     (Consider location/radiation/quality/duration/timing/severity/associated sxs/prior Treatment) HPI Comments: Patient is a 28 year old female who presents to the emergency department with 3 or 4 days of dental pain. The patient states that she was eating pork crackling when she felt pain on her right upper and lower jaw. She states she's been having some pain since that time. She noted some blood in her mucus on one of the days. She's not had any high fever. She's not had any difficulty with swallowing or breathing. She has tried ice but this has not been successful in improving her pain.  Patient is a 28 y.o. female presenting with tooth pain. The history is provided by the patient.  Dental Pain Location:  Generalized Associated symptoms: no neck pain     Past Medical History  Diagnosis Date  . Substance abuse     opiates, detox 05/2011  . Heart murmur   . Hx of nausea and vomiting   . Rhabdomyolysis   . Myonecrosis   . Hematuria    Past Surgical History  Procedure Laterality Date  . Back surgery      "tumor" removed from her back   History reviewed. No pertinent family history. History  Substance Use Topics  . Smoking status: Current Every Day Smoker -- 1.00 packs/day for 10 years    Types: Cigarettes  . Smokeless tobacco: Never Used  . Alcohol Use: No   OB History    Gravida Para Term Preterm AB TAB SAB Ectopic Multiple Living   2 2 2       2      Review of Systems  Constitutional: Negative for activity change.       All ROS Neg except as noted in HPI  HENT: Positive for dental problem. Negative for nosebleeds.   Eyes: Negative for photophobia and discharge.  Respiratory: Negative for cough, shortness of breath and wheezing.   Cardiovascular: Negative for chest pain and palpitations.   Gastrointestinal: Negative for abdominal pain and blood in stool.  Genitourinary: Negative for dysuria, frequency and hematuria.  Musculoskeletal: Negative for back pain, arthralgias and neck pain.  Skin: Negative.   Neurological: Negative for dizziness, seizures and speech difficulty.  Psychiatric/Behavioral: Negative for hallucinations and confusion.      Allergies  Hydrocodone and Tomato  Home Medications   Prior to Admission medications   Medication Sig Start Date End Date Taking? Authorizing Provider  metoCLOPramide (REGLAN) 10 MG tablet Take 1 tablet (10 mg total) by mouth every 6 (six) hours as needed for nausea or vomiting. 02/05/14   Juliet RudeNathan R. Pickering, MD  oxyCODONE (ROXICODONE) 5 MG immediate release tablet Take 1 tablet (5 mg total) by mouth every 4 (four) hours as needed for severe pain. 01/13/14   Hope Orlene OchM Neese, NP  ranitidine (ZANTAC) 150 MG tablet Take 150 mg by mouth daily.    Historical Provider, MD   BP 134/79 mmHg  Pulse 100  Temp(Src) 99.4 F (37.4 C)  Resp 20  Ht 5\' 2"  (1.575 m)  Wt 117 lb 9 oz (53.326 kg)  BMI 21.50 kg/m2  SpO2 100% Physical Exam  Constitutional: She is oriented to person, place, and time. She appears well-developed and well-nourished.  Non-toxic appearance.  HENT:  Head: Normocephalic.  Right Ear: Tympanic membrane and  external ear normal.  Left Ear: Tympanic membrane and external ear normal.  There is significant gum disease noted of the upper and lower gum areas. There is increased swelling and question of some drainage from the second molar of the right lower jaw. No visible abscess is appreciated. There is tenderness to percussion of the molars of the upper jaw on the right. The airway is patent. There is no swelling under the tongue.  Eyes: EOM and lids are normal. Pupils are equal, round, and reactive to light.  Neck: Normal range of motion. Neck supple. Carotid bruit is not present.  Cardiovascular: Normal rate, regular rhythm,  normal heart sounds, intact distal pulses and normal pulses.   Pulmonary/Chest: Breath sounds normal. No respiratory distress.  Abdominal: Soft. Bowel sounds are normal. There is no tenderness. There is no guarding.  Musculoskeletal: Normal range of motion.  Lymphadenopathy:       Head (right side): No submandibular adenopathy present.       Head (left side): No submandibular adenopathy present.    She has no cervical adenopathy.  Neurological: She is alert and oriented to person, place, and time. She has normal strength. No cranial nerve deficit or sensory deficit.  Skin: Skin is warm and dry.  Psychiatric: She has a normal mood and affect. Her speech is normal.  Nursing note and vitals reviewed.   ED Course  Procedures (including critical care time) Labs Review Labs Reviewed - No data to display  Imaging Review No results found.   EKG Interpretation None      MDM  Temperature is 99.4, pulse rate is 100, otherwise vital signs are well within normal limits. Pulse oximetry is 100% on room air. Within normal limits by my interpretation.  Patient has pain to percussion of the molars of the upper and lower right jaw. There is question of some drainage from around the second molar of the right lower jaw. But no visible abscess noted. The plan at this time is for the patient be treated with ibuprofen and Amoxil 3 times daily. The patient is to use Tylenol codeine every 6 hours if needed for pain. Patient strongly encouraged to see a dentist as sone as possible.    Final diagnoses:  Toothache    *I have reviewed nursing notes, vital signs, and all appropriate lab and imaging results for this patient.Kathie Dike*    Vishwa Dais M Cayce Quezada, PA-C 03/09/14 1903  Layla MawKristen N Ward, DO 03/09/14 2045

## 2014-03-22 ENCOUNTER — Emergency Department (HOSPITAL_COMMUNITY)
Admission: EM | Admit: 2014-03-22 | Discharge: 2014-03-22 | Disposition: A | Discharge status: Home / Self Care | Payer: Self-pay | Attending: Emergency Medicine | Admitting: Emergency Medicine

## 2014-03-22 ENCOUNTER — Encounter (HOSPITAL_COMMUNITY): Payer: Self-pay | Admitting: Emergency Medicine

## 2014-03-22 DIAGNOSIS — Z791 Long term (current) use of non-steroidal anti-inflammatories (NSAID): Secondary | ICD-10-CM | POA: Insufficient documentation

## 2014-03-22 DIAGNOSIS — R4182 Altered mental status, unspecified: Secondary | ICD-10-CM | POA: Insufficient documentation

## 2014-03-22 DIAGNOSIS — Z8739 Personal history of other diseases of the musculoskeletal system and connective tissue: Secondary | ICD-10-CM | POA: Insufficient documentation

## 2014-03-22 DIAGNOSIS — Z79899 Other long term (current) drug therapy: Secondary | ICD-10-CM | POA: Insufficient documentation

## 2014-03-22 DIAGNOSIS — Z720 Tobacco use: Secondary | ICD-10-CM | POA: Insufficient documentation

## 2014-03-22 DIAGNOSIS — Z792 Long term (current) use of antibiotics: Secondary | ICD-10-CM | POA: Insufficient documentation

## 2014-03-22 DIAGNOSIS — R011 Cardiac murmur, unspecified: Secondary | ICD-10-CM | POA: Insufficient documentation

## 2014-03-22 NOTE — ED Notes (Signed)
EDP at bedside  

## 2014-03-22 NOTE — ED Notes (Signed)
Per [facility] pt. given Narcan prior to arrival. Reports pt. Immediately awoke when given Narcan.

## 2014-03-22 NOTE — ED Notes (Signed)
Patient states she remembers drinking 3 bottles of wine today, but states she seldom drinks.

## 2014-03-22 NOTE — ED Notes (Signed)
Per [facility], [facility] was [facility] [employer] [facility].  Patient states was leaving and began getting dizzy; states she woke up in ambulance.  EMS states patient was breathing 6-8/min.  Patient states she doesn't remember taking any medication today.  Patient not forthcoming with answers.

## 2014-03-22 NOTE — ED Provider Notes (Signed)
CSN: [id number]     Arrival date & time 03/22/14  1958 History   First MD Initiated Contact with Patient 03/22/14 2013     Chief Complaint  Patient presents with  . Altered Mental Status      HPI  Pt was seen at 2015. Per EMS and pt report, c/o gradual onset and resolution of one episode of decreased LOC that occurred PTA. Pt states she was at a local school to watch a Christmas play when she "felt dizzy" when she was leaving. Pt then states she "just remembers waking up in the ambulance." EMS states pt's "RR was 6-8" on their arrival to scene. Pt was given IV narcan with complete resolution of her symptoms. Pt well known to ED for hx opiate abuse. States she "doesn't recall to my knowledge" that she took any opiates tonight before her symptoms began. On arrival to ED, pt texting on her phone, A&O, stating "I'm leaving." Denies any complaints.    Past Medical History  Diagnosis Date  . Substance abuse     opiates, detox 05/2011  . Heart murmur   . Hx of nausea and vomiting   . Rhabdomyolysis   . Myonecrosis   . Hematuria    Past Surgical History  Procedure Laterality Date  . Back surgery      "tumor" removed from her back    History  Substance Use Topics  . Smoking status: Current Every Day Smoker -- 1.00 packs/day for 10 years    Types: Cigarettes  . Smokeless tobacco: Never Used  . Alcohol Use: Yes     Comment: 3 bottles of wine   OB History    Gravida Para Term Preterm AB TAB SAB Ectopic Multiple Living   2 2 2       2      Review of Systems ROS: Statement: All systems negative except as marked or noted in the HPI; Constitutional: Negative for fever and chills. ; ; Eyes: Negative for eye pain, redness and discharge. ; ; ENMT: Negative for ear pain, hoarseness, nasal congestion, sinus pressure and sore throat. ; ; Cardiovascular: Negative for chest pain, palpitations, diaphoresis, dyspnea and peripheral edema. ; ; Respiratory: Negative for cough, wheezing and stridor. ; ;  Gastrointestinal: Negative for nausea, vomiting, diarrhea, abdominal pain, blood in stool, hematemesis, jaundice and rectal bleeding. . ; ; Genitourinary: Negative for dysuria, flank pain and hematuria. ; ; Musculoskeletal: Negative for back pain and neck pain. Negative for swelling and trauma.; ; Skin: Negative for pruritus, rash, abrasions, blisters, bruising and skin lesion.; ; Neuro: +LOC. Negative for headache, lightheadedness and neck stiffness. Negative for weakness, extremity weakness, paresthesias, involuntary movement, seizure.      Allergies  Hydrocodone and Tomato  Home Medications   Prior to Admission medications   Medication Sig Start Date End Date Taking? Authorizing Provider  acetaminophen-codeine (TYLENOL #3) 300-30 MG per tablet Take 1-2 tablets by mouth every 6 (six) hours as needed. 03/09/14   Kathie DikeHobson M Bryant, PA-C  amoxicillin (AMOXIL) 500 MG capsule Take 1 capsule (500 mg total) by mouth 3 (three) times daily. 03/09/14   Kathie DikeHobson M Bryant, PA-C  ibuprofen (ADVIL,MOTRIN) 600 MG tablet 1 po ac and hs QID. Take with food 03/09/14   Kathie DikeHobson M Bryant, PA-C  metoCLOPramide (REGLAN) 10 MG tablet Take 1 tablet (10 mg total) by mouth every 6 (six) hours as needed for nausea or vomiting. 02/05/14   Juliet RudeNathan R. Pickering, MD  oxyCODONE (ROXICODONE) 5 MG immediate release  tablet Take 1 tablet (5 mg total) by mouth every 4 (four) hours as needed for severe pain. 01/13/14   Hope Orlene OchM Neese, NP  ranitidine (ZANTAC) 150 MG tablet Take 150 mg by mouth daily.    Historical Provider, MD   BP 95/50 mmHg  Pulse 81  Temp(Src) 97.6 F (36.4 C) (Oral)  Resp 17  Ht 5\' 2"  (1.575 m)  Wt 117 lb (53.071 kg)  BMI 21.39 kg/m2  SpO2 100%  LMP 02/27/2014 (Approximate) Physical Exam  2020: Physical examination:  Nursing notes reviewed; Vital signs and O2 SAT reviewed;  Constitutional: Well developed, Well nourished, Well hydrated, In no acute distress; Head:  Normocephalic, atraumatic; Eyes: EOMI, PERRL, No  scleral icterus; ENMT: Mouth and pharynx normal, Mucous membranes moist; Neck: Supple, Full range of motion, No lymphadenopathy; Cardiovascular: Regular rate and rhythm, No murmur, rub, or gallop; Respiratory: Breath sounds clear & equal bilaterally, No rales, rhonchi, wheezes.  Speaking full sentences with ease, Normal respiratory effort/excursion; Chest: Nontender, Movement normal; Abdomen: Soft, Nontender, Nondistended, Normal bowel sounds; Genitourinary: No CVA tenderness; Extremities: Pulses normal, No tenderness, No edema, No calf edema or asymmetry.; Neuro: AA&Ox3, Major CN grossly intact.  Speech clear. No gross focal motor or sensory deficits in extremities. Climbs on and off stretcher easily by herself. Gait steady.; Skin: Color normal, Warm, Dry.   ED Course  Procedures   MDM  MDM Reviewed: previous chart, nursing note and vitals     2025:  Pt is A&O, texting and talking on her cellphone. States she does not want any testing and "just wants to go now." Pt's ride has come to the ED and is willing to take her home with them. Agreeable to return if she changes her mind regarding testing or she wishes to go to detox again. Verb understanding.   Samuel JesterKathleen Ellisha Bankson, DO 03/25/14 [phone]

## 2014-03-22 NOTE — Discharge Instructions (Signed)
°Emergency Department Resource Guide °1) Find a Doctor and Pay Out of Pocket °Although you won't have to find out who is covered by your insurance plan, it is a good idea to ask around and get recommendations. You will then need to call the office and see if the doctor you have chosen will accept you as a new patient and what types of options they offer for patients who are self-pay. Some doctors offer discounts or will set up payment plans for their patients who do not have insurance, but you will need to ask so you aren't surprised when you get to your appointment. ° °2) Contact Your Local Health Department °Not all health departments have doctors that can see patients for sick visits, but many do, so it is worth a call to see if yours does. If you don't know where your local health department is, you can check in your phone book. The CDC also has a tool to help you locate your state's health department, and many state websites also have listings of all of their local health departments. ° °3) Find a Walk-in Clinic °If your illness is not likely to be very severe or complicated, you may want to try a walk in clinic. These are popping up all over the country in pharmacies, drugstores, and shopping centers. They're usually staffed by nurse practitioners or physician assistants that have been trained to treat common illnesses and complaints. They're usually fairly quick and inexpensive. However, if you have serious medical issues or chronic medical problems, these are probably not your best option. ° °No Primary Care Doctor: °- Call Health Connect at  832-8000 - they can help you locate a primary care doctor that  accepts your insurance, provides certain services, etc. °- Physician Referral Service- 1-800-533-3463 ° °Chronic Pain Problems: °Organization         Address  Phone   Notes  °[facility]  (336) 297-2271 Patients need to be referred by their primary care doctor.  ° °Medication  Assistance: °Organization         Address  Phone   Notes  °[place] [facility] [address] °[place], [place] [address] (336) 641-8030 --Must be a resident of [place] °-- Must have NO insurance coverage whatsoever (no Medicaid/ Medicare, etc.) °-- The pt. MUST have a primary care doctor that directs their care regularly and follows them in the community °  °[facility]  (866) 331-1348   °[employer]  (888) 892-1162   ° °Agencies that provide inexpensive medical care: °Organization         Address  Phone   Notes  °[facility]  (336) 832-8035   °[facility]    (336) 832-7272   °[facility] [address] °[place], [place] [address] (336) 832-4777   °[facility] [address], °[place] (336) 271-4999   °[facility]    (336) 373-0678   °[facility]    (336) 272-1050   °[facility] ° [address], [place] Phone:  (336) 832-4444, Fax:  (336) 832-4440 Hours of Operation:  9 am - 6 pm, M-F.  Also accepts Medicaid/Medicare and self-pay.  °[facility] ° [address], [place] Phone: (336) 832-3150, Fax: (336) [phone]. Hours of Operation:  8:30 am - 5:30 pm, M-F.  Also accepts Medicaid and self-pay.  °[facility] [place] [address], [place] Phone: (336) 878-6027   °[facility] [address], [place], [place] (336)723-1848, Ext. 123 Mondays & Thursdays: 7-9 AM.  First 15 patients are seen on a first come, first serve basis. °  ° °Medicaid-accepting [place] Providers: ° °Organization         Address  Phone   Notes  °[facility] [address], [place] (336) 641-2100 Also accepts self-pay patients.  °[facility] [address], [place] ° (336) 856-9996   °[facility] [address], [place]  (336) 288-8857   °[facility] [address] [place] [address], [place] (336) 299-7000   °Veita Bland [address], [place]  ° (336) 373-1557 Only accepts [place] Access Medicaid patients after they have their name applied to their card.  ° °Self-Pay (no insurance) in [place]: ° °Organization         Address  Phone   Notes  °Sickle Cell Patients, [facility] [address], [place] (336) 832-1970   °[facility] [address], [place] (336) 832-4400   °[facility] ° [address], [place] (336) 992-4800   °[facility]/Dr. Osei-Bonsu ° [address] [place] [address], [place] or [address], [place] (336) 841-8500 Phone number for both [place] and [place] locations is the same.  °[facility] [address], [place] (336) 299-0000   °[facility] [address] [place] [address], [place] or [address] (336) 852-7530 °(336) 878-2260   °[facility] [address], [place] (336) 350-1642, phone; (336) 294-5005, fax Sees patients 1st and 3rd Saturday of every month.  Must not qualify for public or private insurance (i.e. Medicaid, Medicare, [place] Health Choice, Veterans' Benefits) • Household income should be no more than 200% of the poverty level •The clinic cannot treat you if you are pregnant or think you are pregnant • Sexually transmitted diseases are not treated at the clinic.  ° ° °Dental Care: °Organization         Address  Phone  Notes  °[place] [facility] [address], [place] (336) 641-6152 Accepts children up to age 21 who are enrolled in Medicaid or [place] Health Choice; pregnant women with a Medicaid card; and children who have applied for Medicaid or [place] Health Choice, but were declined, whose parents can pay a reduced fee at time of service.  °[place]  [facility] [place]  [address], [place] (336) 641-7733 Accepts children up to age 21 who are enrolled in Medicaid or [place] Health Choice; pregnant women with a Medicaid card; and children who have applied for Medicaid or [place] Health Choice, but were declined, whose parents can pay a reduced fee at time of service.  °[facility] ° [address], [place] (336) 641-4533 Patients are seen by appointment only. Walk-ins are not accepted. [facility] will see patients 18 years of age and older. °Monday - Tuesday (8am-5pm) °Most Wednesdays (8:30-5pm) °$30 per visit, cash only  °[facility] ° [address], [place] (336) 641-4533 Patients are seen by appointment only. Walk-ins are not accepted. [facility] will see patients 18 years of age and older. °One   Wednesday Evening (Monthly: Volunteer Based).  $30 per visit, cash only  °[facility]  (919) 537-3737 for adults; Children under age 4, call Graduate Pediatric Dentistry at (919) [phone]. Children aged 4-14, please call (919) [phone] to request a pediatric application. ° Dental services are provided in all areas of dental care including fillings, crowns and bridges, complete and partial dentures, implants, gum treatment, root canals, and extractions. Preventive care is also provided. Treatment is provided to both adults and children. °Patients are selected via a lottery and there is often a waiting list. °  °[facility] [address], °[place] ° (336) 763-8833 [email or web address] °  °[facility] [address], [place], [place] (336)723-1848, Ext. 123 Second and Fourth Thursday of each month, opens at 6:30 AM; Clinic ends at 9 AM.  Patients are seen on a first-come first-served basis, and a limited number are seen during each clinic.  ° °[facility] ° [address], [place], [place] (336) 723-7904    Eligibility Requirements °You must have lived in [place], [place], or [place] counties for at least the last three months. °  You cannot be eligible for state or federal sponsored healthcare insurance, including Veterans Administration, Medicaid, or Medicare. °  You generally cannot be eligible for healthcare insurance through your employer.  °  How to apply: °Eligibility screenings are held every Tuesday and Wednesday afternoon from 1:00 pm until 4:00 pm. You do not need an appointment for the interview!  °[facility] [address], [place], [place] 336-631-2330   °[facility]  336-342-8273   °[facility]  336-703-3100   °[facility]  336-570-6415   ° °Behavioral Health Resources in the Community: °Intensive Outpatient Programs °Organization         Address  Phone  Notes  °[facility] [address], [place], [place] 336-878-6098   °[facility] [address], [place], [place] 336-832-9800   °[facility] [address], [place], [place] ° 336-882-2125   °[facility] [address],  °[place], [place] 1-800-853-5163 or 336-641-4981   °Substance Abuse Resources °Organization         Address  Phone  Notes  °[facility]  336-882-2125   °[facility]  336-784-9470   °[facility]  336-285-9073   °[facility]  336-845-3988   °Residential & Outpatient Substance Abuse Program  1-800-659-3381   °Psychological Services °Organization         Address  Phone  Notes  °[facility]  336- 832-9600   °[facility]  336- 378-7881   °[facility] [address], [place] 1-800-853-5163 or 336-641-4981   ° °Mobile Crisis Teams °Organization         Address  Phone  Notes  °[facility], Mobile Crisis Care Unit  1-877-626-1772   °Assertive °[facility] ° [address]  [place], [place] 336-834-9664   °Sharon DeEsch [address] °[place] 336-554-5454   ° °Self-Help/Support Groups °Organization         Address  Phone             Notes  °[facility] of  - variety of support groups  336- 373-1402 Call for more information  °Narcotics Anonymous (NA), [facility] [address], °[place]  2 meetings at this location  ° °  Residential Treatment Programs Organization         Address  Phone  Notes  [facility] [address],    [place]  [phone]   [facility]  [address], [place] [phone], [place], [place] [phone]   [facility] [address], [place] [phone] Admissions: 8am-3pm M-F  [facility] [address]    [place], [place] [phone]   [facility] [address], [place], [place] [phone]   [facility] [address]  [place], [place] [phone]   [facility] Intensive Outpatient [address], [place], [place] [phone]   [facility] ([facility] [address]  [place], [place] [phone] or [phone]   [facility] ([facility] [address] [place], [place] [phone] Accepts Medicaid  [facility] [address]  [place] [phone] Substance Abuse/Addiction Treatment   [facility]         Address  Phone  Notes  [facility]  (856) 157-2306(888) (678)[phone]   Angie FavaJulie Brannon, PhD [address], [address] [place], [place]   ([phone](336) [phone] or [phone](336) [phone]   [facility]   [address] [place], [place] ([phone](336) [phone]   [facility] [address], [place], [place] (405) [phone](336) [phone] Insurance/Medicaid/sponsorship through [facility] [address]                                    [place], [place] [phone](336) [phone] Therapy/tele-psych/case    [facility] [address]   [place], [place] [phone](336) [phone]    Dr. Lolly MustacheArfeen  ([phone](336) [phone]   [facility]  [employer] [facility] 1) [address], [place] 2) [address], [place] 3)  [address], [place] [phone](336) [phone](336) [phone]  [phone](336) [phone]   [facility] Child Abuse Hotline 617-668-8303(336) (682)[phone] or ([phone](336) [phone] (After Hours)      Take your usual prescriptions as previously directed.  Call your regular medical doctor tomorrow to schedule a follow up appointment within the next week.  Return to the Emergency Department immediately sooner if worsening.

## 2014-05-05 ENCOUNTER — Emergency Department (HOSPITAL_COMMUNITY)
Admission: EM | Admit: 2014-05-05 | Discharge: 2014-05-05 | Disposition: A | Discharge status: Home / Self Care | Payer: Self-pay | Attending: Emergency Medicine | Admitting: Emergency Medicine

## 2014-05-05 ENCOUNTER — Encounter (HOSPITAL_COMMUNITY): Payer: Self-pay | Admitting: Emergency Medicine

## 2014-05-05 DIAGNOSIS — R011 Cardiac murmur, unspecified: Secondary | ICD-10-CM | POA: Insufficient documentation

## 2014-05-05 DIAGNOSIS — Z8739 Personal history of other diseases of the musculoskeletal system and connective tissue: Secondary | ICD-10-CM | POA: Insufficient documentation

## 2014-05-05 DIAGNOSIS — R102 Pelvic and perineal pain: Secondary | ICD-10-CM | POA: Insufficient documentation

## 2014-05-05 DIAGNOSIS — K0889 Other specified disorders of teeth and supporting structures: Secondary | ICD-10-CM

## 2014-05-05 DIAGNOSIS — N898 Other specified noninflammatory disorders of vagina: Secondary | ICD-10-CM | POA: Insufficient documentation

## 2014-05-05 DIAGNOSIS — Z202 Contact with and (suspected) exposure to infections with a predominantly sexual mode of transmission: Secondary | ICD-10-CM | POA: Insufficient documentation

## 2014-05-05 DIAGNOSIS — R110 Nausea: Secondary | ICD-10-CM | POA: Insufficient documentation

## 2014-05-05 DIAGNOSIS — R109 Unspecified abdominal pain: Secondary | ICD-10-CM | POA: Insufficient documentation

## 2014-05-05 DIAGNOSIS — R6883 Chills (without fever): Secondary | ICD-10-CM | POA: Insufficient documentation

## 2014-05-05 DIAGNOSIS — K088 Other specified disorders of teeth and supporting structures: Secondary | ICD-10-CM | POA: Insufficient documentation

## 2014-05-05 DIAGNOSIS — Z720 Tobacco use: Secondary | ICD-10-CM | POA: Insufficient documentation

## 2014-05-05 LAB — CBC WITH DIFFERENTIAL/PLATELET
Basophils Absolute: 0 10*3/uL (ref 0.0–0.1)
Basophils Relative: 0 % (ref 0–1)
Eosinophils Absolute: 0.3 10*3/uL (ref 0.0–0.7)
Eosinophils Relative: 4 % (ref 0–5)
HCT: 35.1 % — ABNORMAL LOW (ref 36.0–46.0)
Hemoglobin: 11.9 g/dL — ABNORMAL LOW (ref 12.0–15.0)
LYMPHS PCT: 44 % (ref 12–46)
Lymphs Abs: 2.9 10*3/uL (ref 0.7–4.0)
MCH: 31.5 pg (ref 26.0–34.0)
MCHC: 33.9 g/dL (ref 30.0–36.0)
MCV: 92.9 fL (ref 78.0–100.0)
MONOS PCT: 7 % (ref 3–12)
Monocytes Absolute: 0.5 10*3/uL (ref 0.1–1.0)
Neutro Abs: 3 10*3/uL (ref 1.7–7.7)
Neutrophils Relative %: 45 % (ref 43–77)
PLATELETS: 199 10*3/uL (ref 150–400)
RBC: 3.78 MIL/uL — AB (ref 3.87–5.11)
RDW: 12.8 % (ref 11.5–15.5)
WBC: 6.7 10*3/uL (ref 4.0–10.5)

## 2014-05-05 LAB — WET PREP, GENITAL
Trich, Wet Prep: NONE SEEN
YEAST WET PREP: NONE SEEN

## 2014-05-05 LAB — BASIC METABOLIC PANEL
Anion gap: 5 (ref 5–15)
BUN: 12 mg/dL (ref 6–23)
CHLORIDE: 106 mmol/L (ref 96–112)
CO2: 28 mmol/L (ref 19–32)
Calcium: 8.7 mg/dL (ref 8.4–10.5)
Creatinine, Ser: 0.74 mg/dL (ref 0.50–1.10)
GLUCOSE: 100 mg/dL — AB (ref 70–99)
POTASSIUM: 3.6 mmol/L (ref 3.5–5.1)
SODIUM: 139 mmol/L (ref 135–145)

## 2014-05-05 MED ORDER — KETOROLAC TROMETHAMINE 30 MG/ML IJ SOLN
INTRAMUSCULAR | Status: AC
  Filled 2014-05-05: qty 1

## 2014-05-05 MED ORDER — LIDOCAINE HCL (PF) 1 % IJ SOLN
INTRAMUSCULAR | Status: AC
Start: 2014-05-05 — End: 2014-05-05
  Administered 2014-05-05: 2.1 mL
  Filled 2014-05-05: qty 5

## 2014-05-05 MED ORDER — KETOROLAC TROMETHAMINE 30 MG/ML IJ SOLN: 30.0000 mg | Freq: Once | INTRAMUSCULAR | Status: DC

## 2014-05-05 MED ORDER — CEFTRIAXONE SODIUM 250 MG IJ SOLR
250.0000 mg | Freq: Once | INTRAMUSCULAR | Status: AC
  Administered 2014-05-05: 250 mg via INTRAMUSCULAR
  Filled 2014-05-05: qty 250

## 2014-05-05 MED ORDER — AZITHROMYCIN 250 MG PO TABS
1000.0000 mg | ORAL_TABLET | Freq: Once | ORAL | Status: AC
  Administered 2014-05-05: 1000 mg via ORAL
  Filled 2014-05-05: qty 4

## 2014-05-05 MED ORDER — OXYCODONE-ACETAMINOPHEN 5-325 MG PO TABS: 2.0000 | ORAL_TABLET | ORAL | Status: DC | PRN

## 2014-05-05 MED ORDER — METRONIDAZOLE 500 MG PO TABS: 500.0000 mg | ORAL_TABLET | Freq: Two times a day (BID) | ORAL | Status: DC

## 2014-05-05 NOTE — ED Notes (Addendum)
Patient states fiance was seen here yesterday and treated for STD. Patient reports "milky white" discharge with odor and "stomach cramps." Denies any pain, itching, or lesion in genital region. 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 Patient also c/o lower dental pain, mostly on left side.

## 2014-05-05 NOTE — ED Provider Notes (Signed)
CSN: [id number]     Arrival date & time 05/05/14  1731 History   First MD Initiated Contact with Patient 05/05/14 1736     Chief Complaint  Patient presents with  . Exposure to STD  . Dental Pain     (Consider location/radiation/quality/duration/timing/severity/associated sxs/prior Treatment) Patient is a 29 y.o. female presenting with pelvic pain. The history is provided by the patient.  Pelvic Pain This is a new problem. The current episode started in the past 7 days. The problem occurs constantly. The problem has been gradually worsening. Associated symptoms include abdominal pain, chills and nausea. Pertinent negatives include no chest pain, fever, headaches, rash or vomiting. She has tried acetaminophen for the symptoms. The treatment provided no relief.   Chelsea May is a 29 y.o. female who presents to the ED with pelvic pain. She states that her boyfriend has had discharge and dysuria and was told yesterday that he has an STI. Patient states that she has had vaginal d/c that is milky white and watery with odor. She request testing for all STI's. States that she has used IV drugs in the past. Last sexual intercourse 2 weeks ago. G4P2 and uses no birth control. No hx of STI's. Patient also complains of dental pain. She states that the right lower second molar is painful with swelling of the gum and her front teeth are sensitive to cold. She is taking ibuprofen without relief.   Past Medical History  Diagnosis Date  . Substance abuse     opiates, detox 05/2011  . Heart murmur   . Hx of nausea and vomiting   . Rhabdomyolysis   . Myonecrosis   . Hematuria    Past Surgical History  Procedure Laterality Date  . Back surgery      "tumor" removed from her back   History reviewed. No pertinent family history. History  Substance Use Topics  . Smoking status: Current Every Day Smoker -- 1.00 packs/day for 10 years    Types: Cigarettes  . Smokeless tobacco: Never Used  .  Alcohol Use: Yes     Comment: 3 bottles of wine   OB History    Gravida Para Term Preterm AB TAB SAB Ectopic Multiple Living   Review of Systems  Constitutional: Positive for chills. Negative for fever.  HENT: Positive for dental problem.   Eyes: Negative for pain, redness and visual disturbance.  Respiratory: Negative.   Cardiovascular: Negative for chest pain and palpitations.  Gastrointestinal: Positive for nausea and abdominal pain. Negative for vomiting.  Genitourinary: Positive for vaginal discharge and pelvic pain. Negative for dysuria, urgency, frequency and hematuria.  Musculoskeletal: Negative for back pain.  Skin: Negative for rash.  Neurological: Negative for syncope and headaches.  Psychiatric/Behavioral: Negative for confusion. The patient is not nervous/anxious.       Allergies  Hydrocodone and Tomato  Home Medications   Prior to Admission medications   Medication Sig Start Date End Date Taking? Authorizing Provider  ibuprofen (ADVIL,MOTRIN) 600 MG tablet 1 po ac and hs QID. Take with food Patient taking differently: Take 600 mg by mouth every 8 (eight) hours as needed for mild pain or moderate pain.  03/09/14  Yes Kathie Dike, PA-C  metroNIDAZOLE (FLAGYL) 500 MG tablet Take 1 tablet (500 mg total) by mouth 2 (two) times daily. 05/05/14   Laquesha Holcomb Orlene Och, NP  oxyCODONE-acetaminophen (PERCOCET/ROXICET) 5-325 MG per tablet Take  2 tablets by mouth every 4 (four) hours as needed for moderate pain or severe pain. 05/05/14   Shirleen Mcfaul Orlene OchM Vina Byrd, NP   BP 127/72 mmHg  Pulse 110  Resp 18  Ht 5\' 2"  (1.575 m)  Wt 113 lb (51.256 kg)  BMI 20.66 kg/m2  SpO2 100%  LMP 04/14/2014 Physical Exam  Constitutional: She is oriented to person, place, and time. She appears well-developed and well-nourished.  HENT:  Head: Normocephalic and atraumatic.  Mouth/Throat:    There is a crown on the right lower second molar and that is where to patient complains of the  pain.   Eyes: Conjunctivae and EOM are normal.  Neck: Normal range of motion. Neck supple.  Cardiovascular: Normal rate.   Pulmonary/Chest: Effort normal.  Abdominal: Soft. There is tenderness. There is no CVA tenderness.  Patient with tenderness on palpation of the lower abdomen, no rebound or guarding.   Genitourinary:  External genitalia without lesions, frothy discharge vaginal vault. No CMT, bilateral adnexal tenderness. Uterus without palpable enlargement.   Musculoskeletal: Normal range of motion. She exhibits no edema.  Neurological: She is alert and oriented to person, place, and time. No cranial nerve deficit.  Skin: Skin is warm and dry.  Psychiatric: She has a normal mood and affect. Her behavior is normal.  Nursing note and vitals reviewed.   ED Course  Procedures (including critical care time) Labs Review Results for orders placed or performed during the hospital encounter of 05/05/14 (from the past 24 hour(s))  CBC with Differential     Status: Abnormal   Collection Time: 05/05/14  6:40 PM  Result Value Ref Range   WBC 6.7 4.0 - 10.5 K/uL   RBC 3.78 (L) 3.87 - 5.11 MIL/uL   Hemoglobin 11.9 (L) 12.0 - 15.0 g/dL   HCT 91.435.1 (L) 78.236.0 - 95.646.0 %   MCV 92.9 78.0 - 100.0 fL   MCH 31.5 26.0 - 34.0 pg   MCHC 33.9 30.0 - 36.0 g/dL   RDW 21.312.8 08.611.5 - 57.815.5 %   Platelets 199 150 - 400 K/uL   Neutrophils Relative % 45 43 - 77 %   Neutro Abs 3.0 1.7 - 7.7 K/uL   Lymphocytes Relative 44 12 - 46 %   Lymphs Abs 2.9 0.7 - 4.0 K/uL   Monocytes Relative 7 3 - 12 %   Monocytes Absolute 0.5 0.1 - 1.0 K/uL   Eosinophils Relative 4 0 - 5 %   Eosinophils Absolute 0.3 0.0 - 0.7 K/uL   Basophils Relative 0 0 - 1 %   Basophils Absolute 0.0 0.0 - 0.1 K/uL  Basic metabolic panel     Status: Abnormal   Collection Time: 05/05/14  6:40 PM  Result Value Ref Range   Sodium 139 135 - 145 mmol/L   Potassium 3.6 3.5 - 5.1 mmol/L   Chloride 106 96 - 112 mmol/L   CO2 28 19 - 32 mmol/L   Glucose,  Bld 100 (H) 70 - 99 mg/dL   BUN 12 6 - 23 mg/dL   Creatinine, Ser 4.690.74 0.50 - 1.10 mg/dL   Calcium 8.7 8.4 - 62.910.5 mg/dL   GFR calc non Af Amer >90 >90 mL/min   GFR calc Af Amer >90 >90 mL/min   Anion gap 5 5 - 15  Wet prep, genital     Status: Abnormal   Collection Time: 05/05/14  6:55 PM  Result Value Ref Range   Yeast Wet Prep HPF POC NONE SEEN NONE SEEN  Trich, Wet Prep NONE SEEN NONE SEEN   Clue Cells Wet Prep HPF POC MANY (A) NONE SEEN   WBC, Wet Prep HPF POC TOO NUMEROUS TO COUNT (A) NONE SEEN    MDM  29 y.o. female with pelvic pain, vaginal discharge and STD exposure. Also complaint of dental pain. Will treat for pain and infection. She will follow up with her GYN or return here as needed for worsening symptoms.   Final diagnoses:  Pelvic pain in female  STD exposure  Pain, dental      Janne Napoleon, NP 05/05/14 2052  Gilda Crease, MD 05/06/14 [phone]

## 2014-05-05 NOTE — ED Notes (Signed)
Patient with no complaints at this time. Respirations even and unlabored. Skin warm/dry. Discharge instructions reviewed with patient at this time. Patient given opportunity to voice concerns/ask questions. Patient discharged at this time and left Emergency Department with steady gait.   

## 2014-05-06 LAB — HEPATITIS C ANTIBODY: HCV Ab: NEGATIVE

## 2014-05-06 LAB — HEPATITIS B SURFACE ANTIGEN: Hepatitis B Surface Ag: NEGATIVE

## 2014-05-07 LAB — GC/CHLAMYDIA PROBE AMP (~~LOC~~) NOT AT ARMC
CHLAMYDIA, DNA PROBE: NEGATIVE
Neisseria Gonorrhea: POSITIVE — AB

## 2014-05-07 LAB — HIV ANTIBODY (ROUTINE TESTING W REFLEX): HIV-1/HIV-2 Ab: NONREACTIVE

## 2014-05-07 LAB — RPR: RPR: NONREACTIVE

## 2014-05-08 MED FILL — Oxycodone w/ Acetaminophen Tab 5-325 MG: ORAL | Qty: 6 | Status: AC

## 2014-05-09 ENCOUNTER — Telehealth (HOSPITAL_BASED_OUTPATIENT_CLINIC_OR_DEPARTMENT_OTHER): Payer: Self-pay | Admitting: Emergency Medicine

## 2014-05-09 NOTE — Telephone Encounter (Signed)
Positive Gonorrhea culture Treated with Rocephin and Zithromax per protocol MD [facility] faxed  05/09/14 @ 1415 patient contacted: ID verified, patient notified of positive Gonorrhea and that treatment was given while in ED with Rocephin and Zithromax. STD instructions provided, patient verbalized understanding.

## 2014-05-26 ENCOUNTER — Observation Stay (HOSPITAL_COMMUNITY)
Admission: EM | Admit: 2014-05-26 | Discharge: 2014-05-26 | Disposition: A | Discharge status: Home / Self Care | Payer: Self-pay | Attending: Internal Medicine | Admitting: Internal Medicine

## 2014-05-26 ENCOUNTER — Encounter (HOSPITAL_COMMUNITY): Payer: Self-pay

## 2014-05-26 DIAGNOSIS — F1310 Sedative, hypnotic or anxiolytic abuse, uncomplicated: Secondary | ICD-10-CM | POA: Insufficient documentation

## 2014-05-26 DIAGNOSIS — Z8739 Personal history of other diseases of the musculoskeletal system and connective tissue: Secondary | ICD-10-CM | POA: Insufficient documentation

## 2014-05-26 DIAGNOSIS — F1210 Cannabis abuse, uncomplicated: Secondary | ICD-10-CM | POA: Insufficient documentation

## 2014-05-26 DIAGNOSIS — Z720 Tobacco use: Secondary | ICD-10-CM | POA: Insufficient documentation

## 2014-05-26 DIAGNOSIS — T50901A Poisoning by unspecified drugs, medicaments and biological substances, accidental (unintentional), initial encounter: Secondary | ICD-10-CM | POA: Diagnosis present

## 2014-05-26 DIAGNOSIS — F1110 Opioid abuse, uncomplicated: Secondary | ICD-10-CM | POA: Diagnosis present

## 2014-05-26 DIAGNOSIS — F329 Major depressive disorder, single episode, unspecified: Secondary | ICD-10-CM | POA: Diagnosis present

## 2014-05-26 DIAGNOSIS — R0689 Other abnormalities of breathing: Secondary | ICD-10-CM

## 2014-05-26 DIAGNOSIS — D649 Anemia, unspecified: Secondary | ICD-10-CM | POA: Insufficient documentation

## 2014-05-26 DIAGNOSIS — F1910 Other psychoactive substance abuse, uncomplicated: Secondary | ICD-10-CM | POA: Diagnosis present

## 2014-05-26 DIAGNOSIS — F32A Depression, unspecified: Secondary | ICD-10-CM | POA: Diagnosis present

## 2014-05-26 DIAGNOSIS — T40601A Poisoning by unspecified narcotics, accidental (unintentional), initial encounter: Secondary | ICD-10-CM | POA: Insufficient documentation

## 2014-05-26 DIAGNOSIS — T400X1A Poisoning by opium, accidental (unintentional), initial encounter: Principal | ICD-10-CM | POA: Insufficient documentation

## 2014-05-26 DIAGNOSIS — R011 Cardiac murmur, unspecified: Secondary | ICD-10-CM | POA: Insufficient documentation

## 2014-05-26 DIAGNOSIS — G9340 Encephalopathy, unspecified: Secondary | ICD-10-CM | POA: Diagnosis present

## 2014-05-26 DIAGNOSIS — G9389 Other specified disorders of brain: Secondary | ICD-10-CM

## 2014-05-26 DIAGNOSIS — T40605A Adverse effect of unspecified narcotics, initial encounter: Secondary | ICD-10-CM

## 2014-05-26 LAB — RAPID URINE DRUG SCREEN, HOSP PERFORMED
Amphetamines: NOT DETECTED
Barbiturates: NOT DETECTED
Benzodiazepines: POSITIVE — AB
Cocaine: NOT DETECTED
OPIATES: POSITIVE — AB
Tetrahydrocannabinol: POSITIVE — AB

## 2014-05-26 LAB — URINALYSIS, ROUTINE W REFLEX MICROSCOPIC
BILIRUBIN URINE: NEGATIVE
Glucose, UA: NEGATIVE mg/dL
Hgb urine dipstick: NEGATIVE
KETONES UR: NEGATIVE mg/dL
Leukocytes, UA: NEGATIVE
Nitrite: NEGATIVE
Protein, ur: NEGATIVE mg/dL
SPECIFIC GRAVITY, URINE: 1.025 (ref 1.005–1.030)
Urobilinogen, UA: 0.2 mg/dL (ref 0.0–1.0)
pH: 5.5 (ref 5.0–8.0)

## 2014-05-26 LAB — COMPREHENSIVE METABOLIC PANEL
ALK PHOS: 50 U/L (ref 39–117)
ALT: 19 U/L (ref 0–35)
ANION GAP: 2 — AB (ref 5–15)
AST: 31 U/L (ref 0–37)
Albumin: 4.2 g/dL (ref 3.5–5.2)
BUN: 10 mg/dL (ref 6–23)
CHLORIDE: 105 mmol/L (ref 96–112)
CO2: 28 mmol/L (ref 19–32)
CREATININE: 0.8 mg/dL (ref 0.50–1.10)
Calcium: 8.2 mg/dL — ABNORMAL LOW (ref 8.4–10.5)
GFR calc Af Amer: >90 mL/min (ref >90)
Glucose, Bld: 101 mg/dL — ABNORMAL HIGH (ref 70–99)
Potassium: 3.6 mmol/L (ref 3.5–5.1)
SODIUM: 135 mmol/L (ref 135–145)
Total Bilirubin: 0.3 mg/dL (ref 0.3–1.2)
Total Protein: 6.8 g/dL (ref 6.0–8.3)

## 2014-05-26 LAB — CBC WITH DIFFERENTIAL/PLATELET
BASOS PCT: 0 % (ref 0–1)
Basophils Absolute: 0 10*3/uL (ref 0.0–0.1)
Eosinophils Absolute: 0.3 10*3/uL (ref 0.0–0.7)
Eosinophils Relative: 3 % (ref 0–5)
HCT: 34.1 % — ABNORMAL LOW (ref 36.0–46.0)
HEMOGLOBIN: 11.5 g/dL — AB (ref 12.0–15.0)
LYMPHS ABS: 3.5 10*3/uL (ref 0.7–4.0)
Lymphocytes Relative: 29 % (ref 12–46)
MCH: 31.7 pg (ref 26.0–34.0)
MCHC: 33.7 g/dL (ref 30.0–36.0)
MCV: 93.9 fL (ref 78.0–100.0)
Monocytes Absolute: 0.9 10*3/uL (ref 0.1–1.0)
Monocytes Relative: 8 % (ref 3–12)
Neutro Abs: 7.1 10*3/uL (ref 1.7–7.7)
Neutrophils Relative %: 60 % (ref 43–77)
PLATELETS: 231 10*3/uL (ref 150–400)
RBC: 3.63 MIL/uL — AB (ref 3.87–5.11)
RDW: 12.8 % (ref 11.5–15.5)
WBC: 11.8 10*3/uL — AB (ref 4.0–10.5)

## 2014-05-26 LAB — ETHANOL: Alcohol, Ethyl (B): <5 mg/dL (ref 0–9)

## 2014-05-26 LAB — CBG MONITORING, ED: GLUCOSE-CAPILLARY: 97 mg/dL (ref 70–99)

## 2014-05-26 LAB — LACTIC ACID, PLASMA: LACTIC ACID, VENOUS: 0.7 mmol/L (ref 0.5–2.0)

## 2014-05-26 MED ORDER — NALOXONE HCL 1 MG/ML IJ SOLN: 1.0000 mg | Freq: Once | INTRAMUSCULAR | Status: AC

## 2014-05-26 MED ORDER — SODIUM CHLORIDE 0.9 % IJ SOLN
3.0000 mL | Freq: Two times a day (BID) | INTRAMUSCULAR | Status: DC
  Administered 2014-05-26: 3 mL via INTRAVENOUS

## 2014-05-26 MED ORDER — LOPERAMIDE HCL 2 MG PO CAPS: 2.0000 mg | ORAL_CAPSULE | ORAL | Status: DC | PRN

## 2014-05-26 MED ORDER — CLONIDINE HCL 0.1 MG PO TABS: 0.1000 mg | ORAL_TABLET | ORAL | Status: DC

## 2014-05-26 MED ORDER — NALOXONE HCL 1 MG/ML IJ SOLN
1.0000 mg | Freq: Once | INTRAMUSCULAR | Status: AC
  Administered 2014-05-26: 1 mg via INTRAVENOUS

## 2014-05-26 MED ORDER — ACETAMINOPHEN 650 MG RE SUPP: 650.0000 mg | Freq: Four times a day (QID) | RECTAL | Status: DC | PRN

## 2014-05-26 MED ORDER — DICYCLOMINE HCL 10 MG PO CAPS: 20.0000 mg | ORAL_CAPSULE | Freq: Four times a day (QID) | ORAL | Status: DC | PRN

## 2014-05-26 MED ORDER — ACETAMINOPHEN 325 MG PO TABS: 650.0000 mg | ORAL_TABLET | Freq: Four times a day (QID) | ORAL | Status: DC | PRN

## 2014-05-26 MED ORDER — HYDROXYZINE HCL 25 MG PO TABS: 25.0000 mg | ORAL_TABLET | Freq: Four times a day (QID) | ORAL | Status: DC | PRN

## 2014-05-26 MED ORDER — METHOCARBAMOL 500 MG PO TABS: 500.0000 mg | ORAL_TABLET | Freq: Three times a day (TID) | ORAL | Status: DC | PRN

## 2014-05-26 MED ORDER — ENOXAPARIN SODIUM 40 MG/0.4ML ~~LOC~~ SOLN
40.0000 mg | SUBCUTANEOUS | Status: DC
Start: 2014-05-26 — End: 2014-05-26

## 2014-05-26 MED ORDER — NAPROXEN 250 MG PO TABS: 500.0000 mg | ORAL_TABLET | Freq: Two times a day (BID) | ORAL | Status: DC | PRN

## 2014-05-26 MED ORDER — ONDANSETRON 4 MG PO TBDP: 4.0000 mg | ORAL_TABLET | Freq: Four times a day (QID) | ORAL | Status: DC | PRN

## 2014-05-26 MED ORDER — LORAZEPAM 2 MG/ML IJ SOLN: 1.0000 mg | Freq: Once | INTRAMUSCULAR | Status: DC

## 2014-05-26 MED ORDER — CLONIDINE HCL 0.1 MG PO TABS: 0.1000 mg | ORAL_TABLET | Freq: Every day | ORAL | Status: DC

## 2014-05-26 MED ORDER — CLONIDINE HCL 0.1 MG PO TABS
0.1000 mg | ORAL_TABLET | Freq: Four times a day (QID) | ORAL | Status: DC
  Administered 2014-05-26 (×2): 0.1 mg via ORAL
  Filled 2014-05-26 (×2): qty 1

## 2014-05-26 MED ORDER — POTASSIUM CHLORIDE IN NACL 20-0.9 MEQ/L-% IV SOLN
INTRAVENOUS | Status: DC
Start: 2014-05-26 — End: 2014-05-26
  Administered 2014-05-26: 13:00:00 via INTRAVENOUS

## 2014-05-26 NOTE — ED Notes (Signed)
sleeping

## 2014-05-26 NOTE — ED Notes (Signed)
"  boyfriend" Chelsea May here, states they have been living together until today when "she [place] her [place] in [place]". Chelsea May states he hasn't seen her since 10pm and doesn't know what she may have taken, he says she can call him later if she needs anything.

## 2014-05-26 NOTE — ED Notes (Signed)
Hospitalist to room as patient being taken up stairs. Patient to be assessed prior to transfer.

## 2014-05-26 NOTE — ED Notes (Signed)
Pt brought in pov by friend that state they found pt walking down the street and when they stopped to pick her up she passed out in their vehicle and they felt she stopped breathing.  Upon arrival, pt had a pulse with shallow respirations at 4 per minute

## 2014-05-26 NOTE — Progress Notes (Signed)
1904 Patient yelled into the hallway "HEY someone get the hell in here!" This Clinical research associatewriter and night shift nurse entered room to check on patient, no call bell noted on. Patient stated "I've got to get the hell out of here! I can't stay here! I have to find my purse and coat and I've got to get out of here. Give me the paper to sign and I'm out of here!" This Clinical research associatewriter obtained AMA paper for patient to read and sign, patient signed, AMA paper placed in patient's chart. IV catheters to RIGHT hand and RIGHT forearm both removed, catheter tips intact, no s/s of infection noted. Tele removed and central tele notified. This Clinical research associatewriter assisted patient with getting ready and made sure she had someone to pick her up from the hospital. Patient walked out of hospital refusing assistance via w/c.

## 2014-05-26 NOTE — ED Notes (Signed)
resp rate decreased and pupils small than before, not responsive to shake/wake. Dr Lynelle Doctorknapp at bedside

## 2014-05-26 NOTE — ED Provider Notes (Signed)
8:15 AM Patient's care assumed from outgoing provider.  She is asleep, awakens briefly, falls asleep quickly.    9:33 AM Patient remains hypersomnolent.  Patient awakens easily, cannot provide details of why she is here beyond that she was having difficulty breathing. She falls asleep again quickly.  Gerhard Munchobert Diquan Kassis, MD 05/26/14 [phone]

## 2014-05-26 NOTE — ED Notes (Signed)
CBG of 97

## 2014-05-26 NOTE — ED Provider Notes (Signed)
CSN: [id number]     Arrival date & time 05/26/14  0047 History   First MD Initiated Contact with Patient 05/26/14 0100     Chief Complaint  Patient presents with  . Drug Overdose   Level V caveat for unresponsiveness.  (Consider location/radiation/quality/duration/timing/severity/associated sxs/prior Treatment) HPI  Nurses report patient presented to the emergency department by POV with some friends who state they found her walking down the street. It is extremely cold tonight. They report she passed out in their vehicle. They report her face was blue. She had a pulse but she did not appear to be breathing.   Past Medical History  Diagnosis Date  . Substance abuse     opiates, detox 05/2011  . Heart murmur   . Hx of nausea and vomiting   . Rhabdomyolysis   . Myonecrosis   . Hematuria    Past Surgical History  Procedure Laterality Date  . Back surgery      "tumor" removed from her back   No family history on file. History  Substance Use Topics  . Smoking status: Current Every Day Smoker -- 1.00 packs/day for 10 years    Types: Cigarettes  . Smokeless tobacco: Never Used  . Alcohol Use: Yes     Comment: 3 bottles of wine   OB History    Gravida Para Term Preterm AB TAB SAB Ectopic Multiple Living   2 2 2       2      Review of Systems  Unable to perform ROS: Patient unresponsive      Allergies  Hydrocodone and Tomato  Home Medications   Prior to Admission medications   Medication Sig Start Date End Date Taking? Authorizing Provider  ibuprofen (ADVIL,MOTRIN) 600 MG tablet 1 po ac and hs QID. Take with food Patient taking differently: Take 600 mg by mouth every 8 (eight) hours as needed for mild pain or moderate pain.  03/09/14   Kathie DikeHobson M Bryant, PA-C  metroNIDAZOLE (FLAGYL) 500 MG tablet Take 1 tablet (500 mg total) by mouth 2 (two) times daily. 05/05/14   Hope Orlene OchM Neese, NP  oxyCODONE-acetaminophen (PERCOCET/ROXICET) 5-325 MG per tablet Take 2 tablets by mouth  every 4 (four) hours as needed for moderate pain or severe pain. 05/05/14   Hope Orlene OchM Neese, NP   BP 137/90 mmHg  Pulse 133  Temp(Src) 97.6 F (36.4 C) (Rectal)  Resp 22  SpO2 97%  LMP  04/14/2014  Vital signs normal except for tachycardia. Her initial blood pressure was normal however shortly afterward it was 94 systolic.   Physical Exam  Constitutional: She appears well-developed and well-nourished.  Non-toxic appearance. She does not appear ill. No distress.  HENT:  Head: Normocephalic and atraumatic.  Right Ear: External ear normal.  Left Ear: External ear normal.  Nose: Nose normal. No mucosal edema or rhinorrhea.  Mouth/Throat: Oropharynx is clear and moist and mucous membranes are normal. No dental abscesses or uvula swelling.  Pt noted to have a thin piece of plastic stuck to her right upper teeth and it was removed. It was a fentanyl patch 100 micrograms.  Eyes: Conjunctivae and EOM are normal. Pupils are equal, round, and reactive to light.  Neck: Normal range of motion and full passive range of motion without pain. Neck supple.  Cardiovascular: Normal rate, regular rhythm and normal heart sounds.  Exam reveals no gallop and no friction rub.   No murmur heard. Pulmonary/Chest: Breath sounds normal. No respiratory distress. She has no  wheezes. She has no rhonchi. She has no rales. She exhibits no tenderness and no crepitus.  Patient has rare shallow breathing  Abdominal: Soft. Normal appearance and bowel sounds are normal. She exhibits no distension. There is no tenderness. There is no rebound and no guarding.  Musculoskeletal: Normal range of motion. She exhibits no edema or tenderness.  Moves all extremities well.   Neurological: She is unresponsive.  Skin: Skin is warm, dry and intact. No rash noted. No erythema. There is pallor.  Patient's face is mottled.  Psychiatric: She is noncommunicative.  Nursing note and vitals reviewed.   ED Course  Procedures (including  critical care time)  Medications  naloxone [facility]) injection 1 mg (1 mg Intravenous Given 05/26/14 0054)  naloxone [facility]) injection 1 mg (0 mg Intravenous Duplicate 05/26/14 0322)  naloxone [facility]) injection 1 mg (1 mg Intravenous Given 05/26/14 0319)   Although patient had a pulse ox of 97% her respiratory rate was extremely low. She had initially had a nasal cannula place. Her facial color was poor. Respiratory therapy assisted her ventilation with a bag valve mask. At this point was noted she had a piece of plastic in her mouth. It was removed and it was a fentanyl patch 100 g. IV was initiated and patient was given Narcan 1 mg IV. Patient quickly started waking up. She seemed a little confused. Her pupils went from pinpoint to dilated. She started having shivering. Patient states she "found" the fentanyl patch. Asks "Am I going to die?".   Review of patient's old ED records so she has frequent ED visits for dental pain. 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  01:20 pt sleeping, RR 10, pupils small again, arouses with sternal rub, but falls back asleep  02:00 Boyfriend here, states they broke up tonight, she left and took her things. He has not seen her since 10 pm. Does not know if she has been using drugs since detox last year. States she has been up for over 24 hrs.   03:15 pt RR down below 5 per minute, BP in the 90's. Does not awakened when shakened, pupils small and equal. Repeat narcan given, pupils now dilated, RR has improved, BP improved. Will awaken but still not hold conversation. ? Also benzo overdose.   04:55 pt easily awakened now, does not recall coming to the ED or being in the ED. Falls back asleep.   06:00 patient now awake. She is wanting Ativan for her "nerves", denies being in withdrawal. She no she's of the hospital. She does not recall breaking up with her fianc. He however had come to the ER earlier and said that she moved all her things out of his place.  He wants him to be called. Nurses going to call for her. Patient was given drinks and crackers to eat. She states she is depressed however she states she would never do anything to herself. She states that she abuses drugs daily. She takes Suboxone that she gets from friends. She states she went to detox last year but did not stay sober when she was discharged.  06:30 Patient's boyfriend/fiance is not answering his phone.   07:00 Patient turned over to Dr Jeraldine Loots until patient can get a ride home  Labs Review Results for orders placed or performed during the hospital encounter of 05/26/14  Comprehensive metabolic panel  Result Value Ref Range   Sodium 135 135 - 145 mmol/L   Potassium 3.6 3.5 - 5.1  mmol/L   Chloride 105 96 - 112 mmol/L   CO2 28 19 - 32 mmol/L   Glucose, Bld 101 (H) 70 - 99 mg/dL   BUN 10 6 - 23 mg/dL   Creatinine, Ser 1.61 0.50 - 1.10 mg/dL   Calcium 8.2 (L) 8.4 - 10.5 mg/dL   Total Protein 6.8 6.0 - 8.3 g/dL   Albumin 4.2 3.5 - 5.2 g/dL   AST 31 0 - 37 U/L   ALT 19 0 - 35 U/L   Alkaline Phosphatase 50 39 - 117 U/L   Total Bilirubin 0.3 0.3 - 1.2 mg/dL   GFR calc non Af Amer >90 >90 mL/min   GFR calc Af Amer >90 >90 mL/min   Anion gap 2 (L) 5 - 15  Ethanol  Result Value Ref Range   Alcohol, Ethyl (B) <5 0 - 9 mg/dL  CBC with Differential  Result Value Ref Range   WBC 11.8 (H) 4.0 - 10.5 K/uL   RBC 3.63 (L) 3.87 - 5.11 MIL/uL   Hemoglobin 11.5 (L) 12.0 - 15.0 g/dL   HCT 09.6 (L) 04.5 - 40.9 %   MCV 93.9 78.0 - 100.0 fL   MCH 31.7 26.0 - 34.0 pg   MCHC 33.7 30.0 - 36.0 g/dL   RDW 81.1 91.4 - 78.2 %   Platelets 231 150 - 400 K/uL   Neutrophils Relative % 60 43 - 77 %   Neutro Abs 7.1 1.7 - 7.7 K/uL   Lymphocytes Relative 29 12 - 46 %   Lymphs Abs 3.5 0.7 - 4.0 K/uL   Monocytes Relative 8 3 - 12 %   Monocytes Absolute 0.9 0.1 - 1.0 K/uL   Eosinophils Relative 3 0 - 5 %   Eosinophils Absolute 0.3 0.0 - 0.7 K/uL   Basophils Relative 0 0 - 1 %   Basophils  Absolute 0.0 0.0 - 0.1 K/uL  Urinalysis, Routine w reflex microscopic  Result Value Ref Range   Color, Urine YELLOW YELLOW   APPearance CLEAR CLEAR   Specific Gravity, Urine 1.025 1.005 - 1.030   pH 5.5 5.0 - 8.0   Glucose, UA NEGATIVE NEGATIVE mg/dL   Hgb urine dipstick NEGATIVE NEGATIVE   Bilirubin Urine NEGATIVE NEGATIVE   Ketones, ur NEGATIVE NEGATIVE mg/dL   Protein, ur NEGATIVE NEGATIVE mg/dL   Urobilinogen, UA 0.2 0.0 - 1.0 mg/dL   Nitrite NEGATIVE NEGATIVE   Leukocytes, UA NEGATIVE NEGATIVE  Urine rapid drug screen (hosp performed)  Result Value Ref Range   Opiates POSITIVE (A) NONE DETECTED   Cocaine NONE DETECTED NONE DETECTED   Benzodiazepines POSITIVE (A) NONE DETECTED   Amphetamines NONE DETECTED NONE DETECTED   Tetrahydrocannabinol POSITIVE (A) NONE DETECTED   Barbiturates NONE DETECTED NONE DETECTED  Lactic acid, plasma  Result Value Ref Range   Lactic Acid, Venous 0.7 0.5 - 2.0 mmol/L  CBG monitoring, ED  Result Value Ref Range   Glucose-Capillary 97 70 - 99 mg/dL    Laboratory interpretation all normal except  + UDS, mild anemia    Imaging Review No results found.   EKG Interpretation   Date/Time:  Saturday May 26 2014 00:54:23 EST Ventricular Rate:  94 PR Interval:  173 QRS Duration: 99 QT Interval:  367 QTC Calculation: 459 R Axis:   -139 Text Interpretation:  Sinus rhythm Right axis deviation Low voltage,  extremity leads No old tracing to compare Confirmed by Burdett Pinzon  MD-I, Chasitee Zenker  ([id number]) on 05/26/2014 1:01:12 AM  MDM   Final diagnoses:  Narcotic overdose, accidental or unintentional, initial encounter    Disposition probable discharge when she can get a ride home   Devoria Albe, MD, Franz Dell, MD 05/26/14 [phone]

## 2014-05-26 NOTE — ED Notes (Signed)
Sleeping, respirations even and adequate.O2 sats 100%

## 2014-05-26 NOTE — H&P (Signed)
[facility] History and Physical  Chelsea May [id number]:[id number] DOB: 03/07/1986 DOA: 05/26/2014  Referring physician: Dr. Jeraldine LootsLockwood PCP: No PCP Per Patient   Chief Complaint: Somnolence  HPI: Chelsea May is a 29 y.o. female who has a long history of substance abuse and multiple ER visits in the past 6 months. She had a voluntary admission to behavioral health Hospital in 2013 for opioid detoxification. Patient was brought to the hospital with some friends after she was noted to have decreased respiration and apparently had cyanosis of her skin. The patient was minimally responsive on arrival to the emergency room. She received a dose of Narcan with improvement of her symptoms. On ER physical examination, it was noted that she had a fentanyl patch on her gums which was removed. She was monitored in the emergency room, and although her overall condition has improved, she still very somnolent and is not felt safe to discharge home at this point. Hospitalists have been requested to evaluate the patient for possible observation admission.   Review of Systems:  Difficult to assess due to mental status, pertinent positives as per history of present illness, otherwise negative  Past Medical History  Diagnosis Date  . Substance abuse     opiates, detox 05/2011  . Heart murmur   . Hx of nausea and vomiting   . Rhabdomyolysis   . Myonecrosis   . Hematuria    Past Surgical History  Procedure Laterality Date  . Back surgery      "tumor" removed from her back   Social History:  reports that she has been smoking Cigarettes.  She has a 10 pack-year smoking history. She has never used smokeless tobacco. She reports that she drinks alcohol. She reports that she uses illicit drugs (Marijuana).  Allergies  Allergen Reactions  . Hydrocodone Itching and Nausea And Vomiting    Patient states that she gets a metal taste in her mouth.  . Tomato Other (See Comments)    Too acidic, gives  patient sores in mouth     Family history: Maternal aunt and cousin have lupus  Prior to Admission medications   Medication Sig Start Date End Date Taking? Authorizing Provider  ibuprofen (ADVIL,MOTRIN) 600 MG tablet 1 po ac and hs QID. Take with food Patient taking differently: Take 600 mg by mouth every 8 (eight) hours as needed for mild pain or moderate pain.  03/09/14   Kathie DikeHobson M Bryant, PA-C  metroNIDAZOLE (FLAGYL) 500 MG tablet Take 1 tablet (500 mg total) by mouth 2 (two) times daily. 05/05/14   Hope Orlene OchM Neese, NP  oxyCODONE-acetaminophen (PERCOCET/ROXICET) 5-325 MG per tablet Take 2 tablets by mouth every 4 (four) hours as needed for moderate pain or severe pain. 05/05/14   Hope Orlene OchM Neese, NP   Physical Exam: Filed Vitals:   05/26/14 0714 05/26/14 0810 05/26/14 0901 05/26/14 1015  BP: 99/60 92/56 92/54  91/48  Pulse: 81 85 70 68  Temp:    97.9 F (36.6 C)  TempSrc:    Oral  Resp: 18 11 11 15   SpO2: 93% 93% 93% 97%    Wt Readings from Last 3 Encounters:  05/05/14 51.256 kg (113 lb)  03/22/14 53.071 kg (117 lb)  03/09/14 53.326 kg (117 lb 9 oz)    General:  Sitting up in bed, but still very somnolent. Frequently falls asleep in conversation Eyes: Pupils are pinpoint, normal lids, irises & conjunctiva ENT: grossly normal hearing, lips & tongue Neck: no LAD, masses or thyromegaly  Cardiovascular: RRR, no m/r/g. No LE edema. Telemetry: SR, no arrhythmias  Respiratory: CTA bilaterally, no w/r/r. Normal respiratory effort. Abdomen: soft, ntnd Skin: no rash or induration seen on limited exam Musculoskeletal: grossly normal tone BUE/BLE Psychiatric: grossly normal mood and affect, speech fluent and appropriate Neurologic: grossly non-focal.          Labs on Admission:  Basic Metabolic Panel:  Recent Labs Lab 05/26/14 0055  NA 135  K 3.6  CL 105  CO2 28  GLUCOSE 101*  BUN 10  CREATININE 0.80  CALCIUM 8.2*   Liver Function Tests:  Recent Labs Lab 05/26/14 0055    AST 31  ALT 19  ALKPHOS 50  BILITOT 0.3  PROT 6.8  ALBUMIN 4.2   No results for input(s): LIPASE, AMYLASE in the last 168 hours. No results for input(s): AMMONIA in the last 168 hours. CBC:  Recent Labs Lab 05/26/14 0055  WBC 11.8*  NEUTROABS 7.1  HGB 11.5*  HCT 34.1*  MCV 93.9  PLT 231   Cardiac Enzymes: No results for input(s): CKTOTAL, CKMB, CKMBINDEX, TROPONINI in the last 168 hours.  BNP (last 3 results) No results for input(s): BNP in the last 8760 hours.  ProBNP (last 3 results) No results for input(s): PROBNP in the last 8760 hours.  CBG:  Recent Labs Lab 05/26/14 0430  GLUCAP 97    Radiological Exams on Admission: No results found.  EKG: Independently reviewed. EKG appears to be nonischemic  Assessment/Plan Active Problems:   Opioid abuse, daily use   Substance abuse   Overdose   Encephalopathy   Narcotic-induced respiratory depression   Depression   Opiate overdose. Patient's urine drug screen was positive for opiates, benzodiazepines as well as cannabis. On further questioning, she reports that she had gone out with her friends last night and had done "a couple of bags of heroin". She admits to having a problem with opiates and has sought out help in the past. She feels a prior attempts at detox were unsuccessful/unhelpful. She is unwilling to consider any help for her substance abuse at this time. She understands risks of continued illicit drug use. She adamantly denies any suicidal ideations and reports that she has many things to live for including her young children. She does not wish to seek any professional help at this time. The patient will be monitored on telemetry. She'll be placed on clonidine detox protocol for her hospital stay.   Her encephalopathy related to her narcotic overdose appears to be slowly improving. We'll need to continue to monitor. Will advance diet as tolerated. Continue IV hydration for now.    Code Status: full  code DVT Prophylaxis: lovenox Family Communication: discussed with patient, no family present Disposition Plan: discharge home once improved  Time spent:  [facility] Pager [phone]

## 2014-05-26 NOTE — ED Notes (Signed)
Woke with rectal temp, very groggy, speech thick. Knows she's in the hospital but not why. Wants something for her nerves. "they give you ativan in detox, can't I have that?" "I hurt all over" "I'm not in withdrawal, I know what that is" "I need some ativan"  Both dr knapp and I explained the events of the night and why she would not be receiving any medications for sedation. Pt given phone to call her friend, falls asleep trying to dial number. Given apple juice and crackers, drank some with assistance as she falls asleep frequently.

## 2014-05-26 NOTE — Progress Notes (Signed)
Patient more alert at this time and multiple requests for food and drink made throughout this shift since her admission. Patient has had an excellent appetite and has consumed large amounts of various foods and drinks. Sitting up in bed at this time finishing her dinner and talking on the phone.

## 2014-05-27 DIAGNOSIS — T40601A Poisoning by unspecified narcotics, accidental (unintentional), initial encounter: Secondary | ICD-10-CM

## 2014-05-27 NOTE — Discharge Summary (Addendum)
Physician Discharge Summary  Chelsea May [id number] DOB: 04/03/1986 DOA: 05/26/2014  PCP: No PCP Per Patient  Admit date: 05/26/2014 Discharge date: 05/26/2014  Time spent: 15 minutes  Recommendations for Outpatient Follow-up:  1. PATIENT LEFT THE HOSPITAL AGAINST MEDICAL ADVICE  Discharge Diagnoses:  Active Problems:   Opioid abuse, daily use   Substance abuse   Overdose   Encephalopathy   Narcotic-induced respiratory depression   Depression    History of present illness:  Patient was admitted to the hospital with increasing somnolence felt to be related to opiate overdose. She was evaluated in the emergency room and noted to have respiratory depression, pinpoint pupils and cyanosis of her skin. She received Narcan with improvement of her symptoms.  Hospital Course:  She was observed in the hospital. Fentanyl patch was removed from her gums. She did not receive any further narcotics in the hospital. Her mental status significantly improved back to baseline. He was offered behavioral health referral for her substance abuse/referral to outpatient programs. Patient does not feel that she has a substance abuse problem at this time, and we'll not accept any referrals for detoxification. She adamantly denied any suicidal ideations. This is likely an accidental overdose. As her mental status returned to her baseline, patient became very agitated and demanded to leave the hospital AGAINST MEDICAL ADVICE.    The results of significant diagnostics from this hospitalization (including imaging, microbiology, ancillary and laboratory) are listed below for reference.    Significant Diagnostic Studies: No results found.  Microbiology: No results found for this or any previous visit (from the past 240 hour(s)).   Labs: Basic Metabolic Panel:  Recent Labs Lab 05/26/14 0055  NA 135  K 3.6  CL 105  CO2 28  GLUCOSE 101*  BUN 10  CREATININE 0.80  CALCIUM 8.2*   Liver Function  Tests:  Recent Labs Lab 05/26/14 0055  AST 31  ALT 19  ALKPHOS 50  BILITOT 0.3  PROT 6.8  ALBUMIN 4.2   No results for input(s): LIPASE, AMYLASE in the last 168 hours. No results for input(s): AMMONIA in the last 168 hours. CBC:  Recent Labs Lab 05/26/14 0055  WBC 11.8*  NEUTROABS 7.1  HGB 11.5*  HCT 34.1*  MCV 93.9  PLT 231   Cardiac Enzymes: No results for input(s): CKTOTAL, CKMB, CKMBINDEX, TROPONINI in the last 168 hours. BNP: BNP (last 3 results) No results for input(s): BNP in the last 8760 hours.  ProBNP (last 3 results) No results for input(s): PROBNP in the last 8760 hours.  CBG:  Recent Labs Lab 05/26/14 0430  GLUCAP 97       Signed:  MEMON,JEHANZEB  [facility] 05/27/2014, 8:05 PM

## 2014-08-03 ENCOUNTER — Emergency Department (HOSPITAL_COMMUNITY): Payer: Self-pay

## 2014-08-03 ENCOUNTER — Emergency Department (HOSPITAL_COMMUNITY)
Admission: EM | Admit: 2014-08-03 | Discharge: 2014-08-03 | Disposition: A | Discharge status: Home / Self Care | Payer: Self-pay | Attending: Emergency Medicine | Admitting: Emergency Medicine

## 2014-08-03 ENCOUNTER — Encounter (HOSPITAL_COMMUNITY): Payer: Self-pay | Admitting: Emergency Medicine

## 2014-08-03 DIAGNOSIS — M5440 Lumbago with sciatica, unspecified side: Secondary | ICD-10-CM | POA: Insufficient documentation

## 2014-08-03 DIAGNOSIS — Z3202 Encounter for pregnancy test, result negative: Secondary | ICD-10-CM | POA: Insufficient documentation

## 2014-08-03 DIAGNOSIS — Z720 Tobacco use: Secondary | ICD-10-CM | POA: Insufficient documentation

## 2014-08-03 DIAGNOSIS — S3991XA Unspecified injury of abdomen, initial encounter: Secondary | ICD-10-CM | POA: Insufficient documentation

## 2014-08-03 DIAGNOSIS — S79912A Unspecified injury of left hip, initial encounter: Secondary | ICD-10-CM | POA: Insufficient documentation

## 2014-08-03 DIAGNOSIS — Y998 Other external cause status: Secondary | ICD-10-CM | POA: Insufficient documentation

## 2014-08-03 DIAGNOSIS — S3992XA Unspecified injury of lower back, initial encounter: Secondary | ICD-10-CM | POA: Insufficient documentation

## 2014-08-03 DIAGNOSIS — R109 Unspecified abdominal pain: Secondary | ICD-10-CM

## 2014-08-03 DIAGNOSIS — Y9389 Activity, other specified: Secondary | ICD-10-CM | POA: Insufficient documentation

## 2014-08-03 DIAGNOSIS — M545 Low back pain: Secondary | ICD-10-CM

## 2014-08-03 DIAGNOSIS — Y92410 Unspecified street and highway as the place of occurrence of the external cause: Secondary | ICD-10-CM | POA: Insufficient documentation

## 2014-08-03 DIAGNOSIS — R011 Cardiac murmur, unspecified: Secondary | ICD-10-CM | POA: Insufficient documentation

## 2014-08-03 DIAGNOSIS — F1310 Sedative, hypnotic or anxiolytic abuse, uncomplicated: Secondary | ICD-10-CM | POA: Insufficient documentation

## 2014-08-03 DIAGNOSIS — Z791 Long term (current) use of non-steroidal anti-inflammatories (NSAID): Secondary | ICD-10-CM | POA: Insufficient documentation

## 2014-08-03 LAB — COMPREHENSIVE METABOLIC PANEL
ALBUMIN: 4.2 g/dL (ref 3.5–5.2)
ALT: 12 U/L (ref 0–35)
AST: 18 U/L (ref 0–37)
Alkaline Phosphatase: 49 U/L (ref 39–117)
Anion gap: 7 (ref 5–15)
BUN: 11 mg/dL (ref 6–23)
CALCIUM: 8.6 mg/dL (ref 8.4–10.5)
CHLORIDE: 107 mmol/L (ref 96–112)
CO2: 27 mmol/L (ref 19–32)
CREATININE: 0.65 mg/dL (ref 0.50–1.10)
GFR calc Af Amer: >90 mL/min (ref >90)
GFR calc non Af Amer: >90 mL/min (ref >90)
Glucose, Bld: 83 mg/dL (ref 70–99)
Potassium: 3.8 mmol/L (ref 3.5–5.1)
Sodium: 141 mmol/L (ref 135–145)
TOTAL PROTEIN: 6.9 g/dL (ref 6.0–8.3)
Total Bilirubin: 0.6 mg/dL (ref 0.3–1.2)

## 2014-08-03 LAB — RAPID URINE DRUG SCREEN, HOSP PERFORMED
Amphetamines: NOT DETECTED
BARBITURATES: NOT DETECTED
BENZODIAZEPINES: POSITIVE — AB
Cocaine: NOT DETECTED
Opiates: NOT DETECTED
Tetrahydrocannabinol: NOT DETECTED

## 2014-08-03 LAB — CK: CK TOTAL: 71 U/L (ref 7–177)

## 2014-08-03 LAB — CBC WITH DIFFERENTIAL/PLATELET
Basophils Absolute: 0 10*3/uL (ref 0.0–0.1)
Basophils Relative: 0 % (ref 0–1)
EOS PCT: 4 % (ref 0–5)
Eosinophils Absolute: 0.2 10*3/uL (ref 0.0–0.7)
HCT: 34.9 % — ABNORMAL LOW (ref 36.0–46.0)
HEMOGLOBIN: 12 g/dL (ref 12.0–15.0)
LYMPHS ABS: 2.6 10*3/uL (ref 0.7–4.0)
Lymphocytes Relative: 53 % — ABNORMAL HIGH (ref 12–46)
MCH: 31.4 pg (ref 26.0–34.0)
MCHC: 34.4 g/dL (ref 30.0–36.0)
MCV: 91.4 fL (ref 78.0–100.0)
Monocytes Absolute: 0.5 10*3/uL (ref 0.1–1.0)
Monocytes Relative: 10 % (ref 3–12)
Neutro Abs: 1.7 10*3/uL (ref 1.7–7.7)
Neutrophils Relative %: 33 % — ABNORMAL LOW (ref 43–77)
PLATELETS: 198 10*3/uL (ref 150–400)
RBC: 3.82 MIL/uL — ABNORMAL LOW (ref 3.87–5.11)
RDW: 12.3 % (ref 11.5–15.5)
WBC: 5 10*3/uL (ref 4.0–10.5)

## 2014-08-03 LAB — URINALYSIS, ROUTINE W REFLEX MICROSCOPIC
BILIRUBIN URINE: NEGATIVE
Glucose, UA: NEGATIVE mg/dL
Hgb urine dipstick: NEGATIVE
Ketones, ur: NEGATIVE mg/dL
Leukocytes, UA: NEGATIVE
NITRITE: NEGATIVE
Protein, ur: NEGATIVE mg/dL
SPECIFIC GRAVITY, URINE: 1.02 (ref 1.005–1.030)
UROBILINOGEN UA: 0.2 mg/dL (ref 0.0–1.0)
pH: 6 (ref 5.0–8.0)

## 2014-08-03 LAB — PREGNANCY, URINE: Preg Test, Ur: NEGATIVE

## 2014-08-03 LAB — LIPASE, BLOOD: LIPASE: 24 U/L (ref 11–59)

## 2014-08-03 LAB — ETHANOL: ALCOHOL ETHYL (B): 34 mg/dL — AB (ref 0–9)

## 2014-08-03 MED ORDER — NAPROXEN 500 MG PO TABS: 500.0000 mg | ORAL_TABLET | Freq: Two times a day (BID) | ORAL | Status: DC

## 2014-08-03 MED ORDER — OXYCODONE-ACETAMINOPHEN 5-325 MG PO TABS
1.0000 | ORAL_TABLET | Freq: Once | ORAL | Status: AC
  Administered 2014-08-03: 1 via ORAL
  Filled 2014-08-03: qty 1

## 2014-08-03 MED ORDER — ACETAMINOPHEN 500 MG PO TABS: 1000.0000 mg | ORAL_TABLET | Freq: Once | ORAL | Status: DC

## 2014-08-03 NOTE — ED Notes (Signed)
MD at bedside. 

## 2014-08-03 NOTE — Discharge Instructions (Signed)
Abdominal Pain As we discussed, you need to have a repeat ultrasound of your gallbladder in 2 months. Follow up with Dr. Karilyn Cotaehman.  Return to the ED if you develop new or worsening symptoms. Many things can cause abdominal pain. Usually, abdominal pain is not caused by a disease and will improve without treatment. It can often be observed and treated at home. Your health care provider will do a physical exam and possibly order blood tests and X-rays to help determine the seriousness of your pain. However, in many cases, more time must pass before a clear cause of the pain can be found. Before that point, your health care provider may not know if you need more testing or further treatment. HOME CARE INSTRUCTIONS  Monitor your abdominal pain for any changes. The following actions may help to alleviate any discomfort you are experiencing:  Only take over-the-counter or prescription medicines as directed by your health care provider.  Do not take laxatives unless directed to do so by your health care provider.  Try a clear liquid diet (broth, tea, or water) as directed by your health care provider. Slowly move to a bland diet as tolerated. SEEK MEDICAL CARE IF:  You have unexplained abdominal pain.  You have abdominal pain associated with nausea or diarrhea.  You have pain when you urinate or have a bowel movement.  You experience abdominal pain that wakes you in the night.  You have abdominal pain that is worsened or improved by eating food.  You have abdominal pain that is worsened with eating fatty foods.  You have a fever. SEEK IMMEDIATE MEDICAL CARE IF:   Your pain does not go away within 2 hours.  You keep throwing up (vomiting).  Your pain is felt only in portions of the abdomen, such as the right side or the left lower portion of the abdomen.  You pass bloody or black tarry stools. MAKE SURE YOU:  Understand these instructions.   Will watch your condition.   Will get help  right away if you are not doing well or get worse.  Document Released: 01/07/2005 Document Revised: 04/04/2013 Document Reviewed: 12/07/2012 [facility] Patient Information 2015 [employer], [place]. This information is not intended to replace advice given to you by your health care provider. Make sure you discuss any questions you have with your health care provider.

## 2014-08-03 NOTE — ED Notes (Signed)
Pt states she has had "nothing done" for her when nurse informed her of EDP insturctions. Pt has been advised of Discharge instruction and signed for discharge paperwork, pt is resistant to leave room. Charge nurse made aware.

## 2014-08-03 NOTE — ED Notes (Signed)
Pt c/o bilat hip pain, worse over the past month and a half.

## 2014-08-03 NOTE — ED Notes (Signed)
US at bedside
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
## 2014-08-03 NOTE — ED Notes (Addendum)
After multiple attempts to get patient to leave treatment room after she has been discharged, this nurse entered room to find patient snorting a white powder substance from the table top.  When approached, pt threw a glass of water onto the area of powder and then brushed the area with the palm of her hand.  [employer] officer called to the back to assist with removal of patient.

## 2014-08-03 NOTE — ED Notes (Signed)
Pt in police custody and at bedside 

## 2014-08-03 NOTE — ED Notes (Signed)
Pt ambulated from stretcher to door, pt complaining of left hip pain with limp.

## 2014-08-03 NOTE — ED Notes (Signed)
Pt in police custody. Handcuffed on arrival.

## 2014-08-03 NOTE — ED Notes (Signed)
Pt refused Naproxin prescription, states she cant take it due to it making her have NV&D when she takes it. Asked to see EDP again, informed doctor of her request. Pt making calls to get ride home at this time.

## 2014-08-03 NOTE — ED Notes (Signed)
Pt complains of many numerous pain complains that are inconsistent with her previous complaints on this visit. MD at bedside evaluating pt.

## 2014-08-03 NOTE — ED Notes (Signed)
Pt admits to drinking 3 glasses of wine and smoking cigarettes and marijuana.

## 2014-08-30 ENCOUNTER — Encounter (HOSPITAL_COMMUNITY): Payer: Self-pay | Admitting: *Deleted

## 2014-08-30 ENCOUNTER — Emergency Department (HOSPITAL_COMMUNITY)
Admission: EM | Admit: 2014-08-30 | Discharge: 2014-08-30 | Disposition: A | Discharge status: Home / Self Care | Payer: Self-pay | Attending: Emergency Medicine | Admitting: Emergency Medicine

## 2014-08-30 DIAGNOSIS — F1110 Opioid abuse, uncomplicated: Secondary | ICD-10-CM | POA: Insufficient documentation

## 2014-08-30 DIAGNOSIS — F1910 Other psychoactive substance abuse, uncomplicated: Secondary | ICD-10-CM

## 2014-08-30 DIAGNOSIS — Z8739 Personal history of other diseases of the musculoskeletal system and connective tissue: Secondary | ICD-10-CM | POA: Insufficient documentation

## 2014-08-30 DIAGNOSIS — Z720 Tobacco use: Secondary | ICD-10-CM | POA: Insufficient documentation

## 2014-08-30 DIAGNOSIS — R011 Cardiac murmur, unspecified: Secondary | ICD-10-CM | POA: Insufficient documentation

## 2014-08-30 DIAGNOSIS — R400 Somnolence: Secondary | ICD-10-CM

## 2014-08-30 DIAGNOSIS — Z791 Long term (current) use of non-steroidal anti-inflammatories (NSAID): Secondary | ICD-10-CM | POA: Insufficient documentation

## 2014-08-30 DIAGNOSIS — Z79899 Other long term (current) drug therapy: Secondary | ICD-10-CM | POA: Insufficient documentation

## 2014-08-30 NOTE — ED Notes (Signed)
Pt brought in by [facility]. They were called out due to pt being hard to arouse. Pt's probation officer was at the house and was concerned and called ems. Pt does admit to chewing a fentanyl patch yesterday because she didn't have her suboxone. Pt is supposed to sign a release so the dr's can speak with her probation officer. Pt denies SI/HI and says the reason she was hard to arouse is because she took her celexa late.

## 2014-08-30 NOTE — ED Provider Notes (Signed)
CSN: [id number]     Arrival date & time 08/30/14  2050 History   First MD Initiated Contact with Patient 08/30/14 2101     This chart was scribed for Eber HongBrian Jabaree Mercado, MD by Arlan OrganAshley Leger, ED Scribe. This patient was seen in room APA03/APA03 and the patient's care was started 9:02 PM.   Chief Complaint  Patient presents with  . Altered Mental Status   HPI  HPI Comments: Chelsea May brought in by [facility] is a 29 y.o. female with a PMHx of substance abuse who presents to the Emergency Department here for altered mental status this evening. It is reported that pt was hard to arouse when her probation officer arrived at her house. However, pt states she took her Celexa too late this evening resulting in her being "very tired". Ms. Lorinda CreedShrewsberry admits to chewing on fentanyl patches yesterday as she did not have her Suboxene strips. However, most recent Suboxone prescription filled 5/16. Pt did not wish to disclose where she received patches from. She denies any other illicit drug use. Pt is currently enrolled in a rehab facility but admits to using Suboxone on the street for a short period of time prior to start of program. At that time, pt was arrested for possession of prescription and illicit drugs. No current nausea, vomiting, diarrhea, fever, chills, or abdominal pain. No SI/Hi at this time.  Past Medical History  Diagnosis Date  . Substance abuse     opiates, detox 05/2011  . Heart murmur   . Hx of nausea and vomiting   . Rhabdomyolysis   . Myonecrosis   . Hematuria    Past Surgical History  Procedure Laterality Date  . Back surgery      "tumor" removed from her back   Family History  Problem Relation Age of Onset  . Muscular dystrophy Other   . Lupus Other   . Heart disease Other    History  Substance Use Topics  . Smoking status: Current Every Day Smoker -- 1.00 packs/day for 10 years    Types: Cigarettes  . Smokeless tobacco: Never Used  . Alcohol Use: Yes     Comment: 3  bottles of wine   OB History    Gravida Para Term Preterm AB TAB SAB Ectopic Multiple Living   2 2 2       2      Review of Systems  Psychiatric/Behavioral: Positive for decreased concentration. Negative for suicidal ideas.  All other systems reviewed and are negative.     Allergies  Hydrocodone and Tomato  Home Medications   Prior to Admission medications   Medication Sig Start Date End Date Taking? Authorizing Provider  Buprenorphine HCl-Naloxone HCl 8-2 MG FILM Place 1.5 Film under the tongue daily.   Yes Historical Provider, MD  citalopram (CELEXA) 20 MG tablet Take 20 mg by mouth daily.   Yes Historical Provider, MD  Cranberry-Vitamin C-Probiotic (AZO CRANBERRY) 250-30 MG TABS Take 2 tablets by mouth daily.   Yes Historical Provider, MD  ibuprofen (ADVIL,MOTRIN) 600 MG tablet 1 po ac and hs QID. Take with food Patient not taking: Reported on 08/03/2014 03/09/14   Ivery QualeHobson Bryant, PA-C  metroNIDAZOLE (FLAGYL) 500 MG tablet Take 1 tablet (500 mg total) by mouth 2 (two) times daily. Patient not taking: Reported on 08/03/2014 05/05/14   Janne NapoleonHope M Neese, NP  naproxen (NAPROSYN) 500 MG tablet Take 1 tablet (500 mg total) by mouth 2 (two) times daily. Patient not taking: Reported on 08/30/2014  08/03/14   Glynn OctaveStephen Rancour, MD  oxyCODONE-acetaminophen (PERCOCET/ROXICET) 5-325 MG per tablet Take 2 tablets by mouth every 4 (four) hours as needed for moderate pain or severe pain. Patient not taking: Reported on 08/03/2014 05/05/14   Janne NapoleonHope M Neese, NP   Triage Vitals: BP 98/60 mmHg  Pulse 82  Temp(Src) 98.1 F (36.7 C)  Resp 18  Ht 5\' 2"  (1.575 m)  Wt 111 lb (50.349 kg)  BMI 20.30 kg/m2  SpO2 99%  LMP 07/12/2014   Physical Exam  Constitutional: She appears well-developed and well-nourished. No distress.  Pt is tired appearing but easily arousable.  HENT:  Head: Normocephalic and atraumatic.  Mouth/Throat: Oropharynx is clear and moist. No oropharyngeal exudate.  Eyes: Conjunctivae and EOM  are normal. Pupils are equal, round, and reactive to light. Right eye exhibits no discharge. Left eye exhibits no discharge. No scleral icterus.  Neck: Normal range of motion. Neck supple. No JVD present. No thyromegaly present.  Cardiovascular: Normal rate, regular rhythm, normal heart sounds and intact distal pulses.  Exam reveals no gallop and no friction rub.   No murmur heard. Pulmonary/Chest: Effort normal and breath sounds normal. No respiratory distress. She has no wheezes. She has no rales.  Abdominal: Soft. Bowel sounds are normal. She exhibits no distension and no mass. There is no tenderness.  Musculoskeletal: Normal range of motion. She exhibits no edema or tenderness.  Lymphadenopathy:    She has no cervical adenopathy.  Neurological: She is alert. Coordination normal.  Alert and speaks without slurring speech, ambulatory without difficulty  Skin: Skin is warm and dry. No rash noted. No erythema.  Psychiatric: She has a normal mood and affect. Her behavior is normal.  Nursing note and vitals reviewed.   ED Course  Procedures (including critical care time)  DIAGNOSTIC STUDIES: Oxygen Saturation is 99% on RA, Normal by my interpretation.    COORDINATION OF CARE: 9:13 PM- Discussed treatment plan with pt at bedside and pt agreed to plan.     Labs Review Labs Reviewed - No data to display  Imaging Review No results found.    MDM   Final diagnoses:  Somnolence  Drug abuse    The patient is not forthcoming about her use of illegal drugs but is also not suicidal - she has requested d/c and at this time there is no respiratory issues, normal VS and no acute concern for intentional self injury or danger to others.   I personally performed the services described in this documentation, which was scribed in my presence. The recorded information has been reviewed and is accurate.    Eber HongBrian Lamon Rotundo, MD 08/31/14 [phone]

## 2014-08-30 NOTE — Discharge Instructions (Signed)
Please call your doctor for a followup appointment within 24-48 hours. When you talk to your doctor please let them know that you were seen in the emergency department and have them acquire all of your records so that they can discuss the findings with you and formulate a treatment plan to fully care for your new and ongoing problems. ° °

## 2014-09-11 ENCOUNTER — Encounter: Payer: Self-pay | Admitting: Emergency Medicine

## 2014-09-11 DIAGNOSIS — F1110 Opioid abuse, uncomplicated: Secondary | ICD-10-CM | POA: Insufficient documentation

## 2014-09-11 DIAGNOSIS — Z791 Long term (current) use of non-steroidal anti-inflammatories (NSAID): Secondary | ICD-10-CM | POA: Insufficient documentation

## 2014-09-11 DIAGNOSIS — Z79899 Other long term (current) drug therapy: Secondary | ICD-10-CM | POA: Insufficient documentation

## 2014-09-11 DIAGNOSIS — Z720 Tobacco use: Secondary | ICD-10-CM | POA: Insufficient documentation

## 2014-09-11 LAB — CBC
HEMATOCRIT: 38.6 % (ref 35.0–47.0)
Hemoglobin: 13 g/dL (ref 12.0–16.0)
MCH: 31 pg (ref 26.0–34.0)
MCHC: 33.6 g/dL (ref 32.0–36.0)
MCV: 92.2 fL (ref 80.0–100.0)
Platelets: 196 10*3/uL (ref 150–440)
RBC: 4.19 MIL/uL (ref 3.80–5.20)
RDW: 12.6 % (ref 11.5–14.5)
WBC: 6.1 10*3/uL (ref 3.6–11.0)

## 2014-09-11 NOTE — ED Notes (Signed)
No answer when called from lobby 

## 2014-09-11 NOTE — ED Notes (Signed)
Pt comes to [facility] registration desk to state that she was outside; pt placed back on waiting board

## 2014-09-12 ENCOUNTER — Emergency Department (HOSPITAL_COMMUNITY)
Admission: EM | Admit: 2014-09-12 | Discharge: 2014-09-12 | Disposition: A | Discharge status: Home / Self Care | Payer: No Typology Code available for payment source | Attending: Emergency Medicine | Admitting: Emergency Medicine

## 2014-09-12 ENCOUNTER — Emergency Department
Admission: EM | Admit: 2014-09-12 | Discharge: 2014-09-12 | Disposition: A | Discharge status: Home / Self Care | Payer: Self-pay | Attending: Emergency Medicine | Admitting: Emergency Medicine

## 2014-09-12 ENCOUNTER — Emergency Department (HOSPITAL_COMMUNITY): Payer: No Typology Code available for payment source

## 2014-09-12 ENCOUNTER — Encounter (HOSPITAL_COMMUNITY): Payer: Self-pay | Admitting: *Deleted

## 2014-09-12 DIAGNOSIS — R011 Cardiac murmur, unspecified: Secondary | ICD-10-CM | POA: Insufficient documentation

## 2014-09-12 DIAGNOSIS — Z8739 Personal history of other diseases of the musculoskeletal system and connective tissue: Secondary | ICD-10-CM | POA: Insufficient documentation

## 2014-09-12 DIAGNOSIS — Y998 Other external cause status: Secondary | ICD-10-CM | POA: Insufficient documentation

## 2014-09-12 DIAGNOSIS — Y92410 Unspecified street and highway as the place of occurrence of the external cause: Secondary | ICD-10-CM | POA: Diagnosis not present

## 2014-09-12 DIAGNOSIS — Z3202 Encounter for pregnancy test, result negative: Secondary | ICD-10-CM | POA: Diagnosis not present

## 2014-09-12 DIAGNOSIS — Z720 Tobacco use: Secondary | ICD-10-CM | POA: Insufficient documentation

## 2014-09-12 DIAGNOSIS — S0990XA Unspecified injury of head, initial encounter: Secondary | ICD-10-CM | POA: Diagnosis not present

## 2014-09-12 DIAGNOSIS — S46919A Strain of unspecified muscle, fascia and tendon at shoulder and upper arm level, unspecified arm, initial encounter: Secondary | ICD-10-CM | POA: Diagnosis not present

## 2014-09-12 DIAGNOSIS — Y9389 Activity, other specified: Secondary | ICD-10-CM | POA: Insufficient documentation

## 2014-09-12 DIAGNOSIS — Z79899 Other long term (current) drug therapy: Secondary | ICD-10-CM | POA: Insufficient documentation

## 2014-09-12 DIAGNOSIS — F1110 Opioid abuse, uncomplicated: Secondary | ICD-10-CM

## 2014-09-12 DIAGNOSIS — S46819A Strain of other muscles, fascia and tendons at shoulder and upper arm level, unspecified arm, initial encounter: Secondary | ICD-10-CM

## 2014-09-12 DIAGNOSIS — S199XXA Unspecified injury of neck, initial encounter: Secondary | ICD-10-CM | POA: Diagnosis present

## 2014-09-12 LAB — URINE DRUG SCREEN, QUALITATIVE (ARMC ONLY)
Amphetamines, Ur Screen: NOT DETECTED
Barbiturates, Ur Screen: NOT DETECTED
Benzodiazepine, Ur Scrn: POSITIVE — AB
Cannabinoid 50 Ng, Ur ~~LOC~~: POSITIVE — AB
Cocaine Metabolite,Ur ~~LOC~~: NOT DETECTED
MDMA (Ecstasy)Ur Screen: NOT DETECTED
Methadone Scn, Ur: NOT DETECTED
Opiate, Ur Screen: NOT DETECTED
Phencyclidine (PCP) Ur S: NOT DETECTED
Tricyclic, Ur Screen: NOT DETECTED

## 2014-09-12 LAB — COMPREHENSIVE METABOLIC PANEL WITH GFR
ALT: 10 U/L — ABNORMAL LOW (ref 14–54)
AST: 17 U/L (ref 15–41)
Albumin: 4.2 g/dL (ref 3.5–5.0)
Alkaline Phosphatase: 45 U/L (ref 38–126)
Anion gap: 8 (ref 5–15)
BUN: 9 mg/dL (ref 6–20)
CO2: 26 mmol/L (ref 22–32)
Calcium: 9.1 mg/dL (ref 8.9–10.3)
Chloride: 105 mmol/L (ref 101–111)
Creatinine, Ser: 0.7 mg/dL (ref 0.44–1.00)
GFR calc Af Amer: >60 mL/min
GFR calc non Af Amer: >60 mL/min
Glucose, Bld: 98 mg/dL (ref 65–99)
Potassium: 3.7 mmol/L (ref 3.5–5.1)
Sodium: 139 mmol/L (ref 135–145)
Total Bilirubin: 0.3 mg/dL (ref 0.3–1.2)
Total Protein: 7.1 g/dL (ref 6.5–8.1)

## 2014-09-12 LAB — URINALYSIS COMPLETE WITH MICROSCOPIC (ARMC ONLY)
Bilirubin Urine: NEGATIVE
Glucose, UA: NEGATIVE mg/dL
Hgb urine dipstick: NEGATIVE
Ketones, ur: NEGATIVE mg/dL
Nitrite: NEGATIVE
PROTEIN: NEGATIVE mg/dL
Specific Gravity, Urine: 1.016 (ref 1.005–1.030)
pH: 8 (ref 5.0–8.0)

## 2014-09-12 LAB — POC URINE PREG, ED: Preg Test, Ur: NEGATIVE

## 2014-09-12 LAB — ETHANOL: Alcohol, Ethyl (B): <5 mg/dL

## 2014-09-12 LAB — SALICYLATE LEVEL: Salicylate Lvl: <4 mg/dL (ref 2.8–30.0)

## 2014-09-12 LAB — ACETAMINOPHEN LEVEL: Acetaminophen (Tylenol), Serum: <10 ug/mL — ABNORMAL LOW (ref 10–30)

## 2014-09-12 MED ORDER — IBUPROFEN 600 MG PO TABS: 600.0000 mg | ORAL_TABLET | Freq: Four times a day (QID) | ORAL | Status: DC | PRN

## 2014-09-12 MED ORDER — BACLOFEN 10 MG PO TABS: 10.0000 mg | ORAL_TABLET | Freq: Three times a day (TID) | ORAL | Status: AC

## 2014-09-12 MED ORDER — KETOROLAC TROMETHAMINE 10 MG PO TABS
10.0000 mg | ORAL_TABLET | Freq: Once | ORAL | Status: AC
  Administered 2014-09-12: 10 mg via ORAL
  Filled 2014-09-12: qty 1

## 2014-09-12 MED ORDER — DIAZEPAM 5 MG PO TABS
5.0000 mg | ORAL_TABLET | Freq: Once | ORAL | Status: AC
  Administered 2014-09-12: 5 mg via ORAL
  Filled 2014-09-12: qty 1
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
## 2014-09-12 NOTE — ED Notes (Signed)
This RN called lab seeking results to UDS and UA that was sent at 0129. Cesily from lab states she is verifying specimen now.

## 2014-09-12 NOTE — Discharge Instructions (Signed)
Polysubstance Abuse °When people abuse more than one drug or type of drug it is called polysubstance or polydrug abuse. For example, many smokers also drink alcohol. This is one form of polydrug abuse. Polydrug abuse also refers to the use of a drug to counteract an unpleasant effect produced by another drug. It may also be used to help with withdrawal from another drug. People who take stimulants may become agitated. Sometimes this agitation is countered with a tranquilizer. This helps protect against the unpleasant side effects. Polydrug abuse also refers to the use of different drugs at the same time.  °Anytime drug use is interfering with normal living activities, it has become abuse. This includes problems with family and friends. Psychological dependence has developed when your mind tells you that the drug is needed. This is usually followed by physical dependence which has developed when continuing increases of drug are required to get the same feeling or "high". This is known as addiction or chemical dependency. A person's risk is much higher if there is a history of chemical dependency in the family. °SIGNS OF CHEMICAL DEPENDENCY °· You have been told by friends or family that drugs have become a problem. °· You fight when using drugs. °· You are having blackouts (not remembering what you do while using). °· You feel sick from using drugs but continue using. °· You lie about use or amounts of drugs (chemicals) used. °· You need chemicals to get you going. °· You are suffering in work performance or in school because of drug use. °· You get sick from use of drugs but continue to use anyway. °· You need drugs to relate to people or feel comfortable in social situations. °· You use drugs to forget problems. °"Yes" answered to any of the above signs of chemical dependency indicates there are problems. The longer the use of drugs continues, the greater the problems will become. °If there is a family history of  drug or alcohol use, it is best not to experiment with these drugs. Continual use leads to tolerance. After tolerance develops more of the drug is needed to get the same feeling. This is followed by addiction. With addiction, drugs become the most important part of life. It becomes more important to take drugs than participate in the other usual activities of life. This includes relating to friends and family. Addiction is followed by dependency. Dependency is a condition where drugs are now needed not just to get high, but to feel normal. °Addiction cannot be cured but it can be stopped. This often requires outside help and the care of professionals. Treatment centers are listed in the yellow pages under: Cocaine, Narcotics, and Alcoholics Anonymous. Most hospitals and clinics can refer you to a specialized care center. Talk to your caregiver if you need help. °Document Released: 11/19/2004 Document Revised: 06/22/2011 Document Reviewed: 03/30/2005 °ExitCare® Patient Information ©2015 [employer]. This information is not intended to replace advice given to you by your health care provider. Make sure you discuss any questions you have with your health care provider. ° °
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
## 2014-09-12 NOTE — ED Notes (Signed)
Pt made aware to return if symptoms worsen or if any life threatening symptoms occur.  Pt made aware that she cannot drive home today, Pt reports that she is going to go home with her boyfriend

## 2014-09-12 NOTE — ED Notes (Signed)
[facility] @ [phone] referred to after hours number [phone], talked to Denita about pt's status, per Denita unable to find pt is system. Made Dr. Manson PasseyBrown aware

## 2014-09-12 NOTE — ED Notes (Signed)

## 2014-09-12 NOTE — ED Provider Notes (Signed)
[facility] Emergency Department Provider Note  ____________________________________________  Time seen: 12:30AM I have reviewed the triage vital signs and the nursing notes.   HISTORY  Chief Complaint Mental Health Problem      HPI Chelsea May is a 29 y.o. female presents with request for "medical clearance"[facility] patient was notified by Jerilynn Som [facility] to go to the emergency department for medical clearance. She is seeking detox for opiates abuse    Past Medical History  Diagnosis Date  . Substance abuse     opiates, detox 05/2011  . Heart murmur   . Hx of nausea and vomiting   . Rhabdomyolysis   . Myonecrosis   . Hematuria     Patient Active Problem List   Diagnosis Date Noted  . Overdose 05/26/2014  . Encephalopathy 05/26/2014  . Narcotic-induced respiratory depression 05/26/2014  . Depression 05/26/2014  . Left hand fracture 01/02/2014  . Substance abuse   . Myonecrosis 01/01/2014  . Intractable pain 01/01/2014  . Myositis 12/31/2013  . Rhabdomyolysis 12/31/2013  . Fall 12/31/2013  . Opioid abuse, daily use 06/06/2011    Past Surgical History  Procedure Laterality Date  . Back surgery      "tumor" removed from her back    Current Outpatient Rx  Name  Route  Sig  Dispense  Refill  . Buprenorphine HCl-Naloxone HCl 8-2 MG FILM   Sublingual   Place 1.5 Film under the tongue daily.         . citalopram (CELEXA) 20 MG tablet   Oral   Take 20 mg by mouth daily.         . Cranberry-Vitamin C-Probiotic (AZO CRANBERRY) 250-30 MG TABS   Oral   Take 2 tablets by mouth daily.         Marland Kitchen ibuprofen (ADVIL,MOTRIN) 600 MG tablet      1 po ac and hs QID. Take with food Patient not taking: Reported on 08/03/2014   30 tablet   0   . metroNIDAZOLE (FLAGYL) 500 MG tablet   Oral   Take 1 tablet (500 mg total) by mouth 2 (two) times daily. Patient not taking: Reported on 08/03/2014   14 tablet   0   . naproxen  (NAPROSYN) 500 MG tablet   Oral   Take 1 tablet (500 mg total) by mouth 2 (two) times daily. Patient not taking: Reported on 08/30/2014   30 tablet   0   . oxyCODONE-acetaminophen (PERCOCET/ROXICET) 5-325 MG per tablet   Oral   Take 2 tablets by mouth every 4 (four) hours as needed for moderate pain or severe pain. Patient not taking: Reported on 08/03/2014   6 tablet   0     Allergies Hydrocodone and Tomato  Family History  Problem Relation Age of Onset  . Muscular dystrophy Other   . Lupus Other   . Heart disease Other     Social History History  Substance Use Topics  . Smoking status: Current Every Day Smoker -- 1.00 packs/day for 10 years    Types: Cigarettes  . Smokeless tobacco: Never Used  . Alcohol Use: Yes     Comment: 3 bottles of wine    Review of Systems  Constitutional: Negative for fever. Eyes: Negative for visual changes. ENT: Negative for sore throat. Cardiovascular: Negative for chest pain. Respiratory: Negative for shortness of breath. Gastrointestinal: Negative for abdominal pain, vomiting and diarrhea. Genitourinary: Negative for dysuria. Musculoskeletal: Negative for back pain. Skin:  Negative for rash. Neurological: Negative for headaches, focal weakness or numbness.   10-point ROS otherwise negative.  ____________________________________________   PHYSICAL EXAM:  VITAL SIGNS: ED Triage Vitals  Enc Vitals Group     BP 09/11/14 2308 126/71 mmHg     Pulse Rate 09/11/14 2308 63     Resp --      Temp 09/11/14 2308 98.7 F (37.1 C)     Temp Source 09/11/14 2308 Oral     SpO2 09/11/14 2308 98 %     Weight 09/11/14 2308 108 lb (48.988 kg)     Height 09/11/14 2308 5\' 2"  (1.575 m)     Head Cir --      Peak Flow --      Pain Score --      Pain Loc --      Pain Edu? --      Excl. in GC? --      Constitutional: Alert and oriented. Well appearing and in no distress. Eyes: Conjunctivae are normal. PERRL. Normal extraocular  movements. ENT   Head: Normocephalic and atraumatic.   Nose: No congestion/rhinnorhea.   Mouth/Throat: Mucous membranes are moist.   Neck: No stridor. Hematological/Lymphatic/Immunilogical: No cervical lymphadenopathy. Cardiovascular: Normal rate, regular rhythm. Normal and symmetric distal pulses are present in all extremities. No murmurs, rubs, or gallops. Respiratory: Normal respiratory effort without tachypnea nor retractions. Breath sounds are clear and equal bilaterally. No wheezes/rales/rhonchi. Gastrointestinal: Soft and nontender. No distention. There is no CVA tenderness. Genitourinary: deferred Musculoskeletal: Nontender with normal range of motion in all extremities. No joint effusions.  No lower extremity tenderness nor edema. Neurologic:  Normal speech and language. No gross focal neurologic deficits are appreciated. Speech is normal.  Skin:  Skin is warm, dry and intact. No rash noted. Psychiatric: Mood and affect are normal. Speech and behavior are normal. Patient exhibits appropriate insight and judgment.  ____________________________________________    LABS (pertinent positives/negatives)  Labs Reviewed  ACETAMINOPHEN LEVEL - Abnormal; Notable for the following:    Acetaminophen (Tylenol), Serum <10 (*)    All other components within normal limits  COMPREHENSIVE METABOLIC PANEL - Abnormal; Notable for the following:    ALT 10 (*)    All other components within normal limits  URINE DRUG SCREEN, QUALITATIVE ([facility] ONLY) - Abnormal; Notable for the following:    Cannabinoid 50 Ng, Ur [place] POSITIVE (*)    Benzodiazepine, Ur Scrn POSITIVE (*)    All other components within normal limits  URINALYSIS COMPLETEWITH MICROSCOPIC ([facility] ONLY) - Abnormal; Notable for the following:    Color, Urine YELLOW (*)    APPearance CLOUDY (*)    Leukocytes, UA TRACE (*)    Bacteria, UA RARE (*)    Squamous Epithelial / LPF 6-30 (*)    All other components within normal  limits  CBC  ETHANOL  SALICYLATE LEVEL  PREGNANCY, URINE     _   INITIAL IMPRESSION / ASSESSMENT AND PLAN / ED COURSE  Pertinent labs & imaging results that were available during my care of the patient were reviewed by me and considered in my medical decision making (see chart for details).  Psychiatry intake spoke with [facility] who refused to accept patient in transfer reason given was complicated medical history.  ____________________________________________   FINAL CLINICAL IMPRESSION(S) / ED DIAGNOSES  Final diagnoses:  Opiate abuse, continuous      Darci Currentandolph N Brown, MD 09/14/14 [phone]
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
## 2014-09-12 NOTE — ED Notes (Signed)
BEHAVIORAL HEALTH ROUNDING Patient sleeping: Yes.   Patient alert and oriented: pt sleeping Behavior appropriate: Yes.  ; If no, describe:  Nutrition and fluids offered: pt sleeping Toileting and hygiene offered: pt sleeping Sitter present: yes Law enforcement present: Yes  

## 2014-09-12 NOTE — ED Notes (Signed)
Pt verbalizes understanding of discharge instructions. Pt reports back she is going [facility] in [facility] morning.

## 2014-09-22 ENCOUNTER — Encounter (HOSPITAL_COMMUNITY): Payer: Self-pay | Admitting: Emergency Medicine

## 2014-09-22 ENCOUNTER — Emergency Department (HOSPITAL_COMMUNITY)
Admission: EM | Admit: 2014-09-22 | Discharge: 2014-09-22 | Disposition: A | Discharge status: Home / Self Care | Payer: Self-pay | Attending: Emergency Medicine | Admitting: Emergency Medicine

## 2014-09-22 DIAGNOSIS — Z8739 Personal history of other diseases of the musculoskeletal system and connective tissue: Secondary | ICD-10-CM | POA: Insufficient documentation

## 2014-09-22 DIAGNOSIS — Z79899 Other long term (current) drug therapy: Secondary | ICD-10-CM | POA: Insufficient documentation

## 2014-09-22 DIAGNOSIS — Z720 Tobacco use: Secondary | ICD-10-CM | POA: Insufficient documentation

## 2014-09-22 DIAGNOSIS — R011 Cardiac murmur, unspecified: Secondary | ICD-10-CM | POA: Insufficient documentation

## 2014-09-22 DIAGNOSIS — Z3202 Encounter for pregnancy test, result negative: Secondary | ICD-10-CM | POA: Insufficient documentation

## 2014-09-22 DIAGNOSIS — R1110 Vomiting, unspecified: Secondary | ICD-10-CM | POA: Insufficient documentation

## 2014-09-22 LAB — CBC WITH DIFFERENTIAL/PLATELET
Basophils Absolute: 0 10*3/uL (ref 0.0–0.1)
Basophils Relative: 0 % (ref 0–1)
EOS ABS: 0 10*3/uL (ref 0.0–0.7)
Eosinophils Relative: 0 % (ref 0–5)
HCT: 40.4 % (ref 36.0–46.0)
Hemoglobin: 13.9 g/dL (ref 12.0–15.0)
LYMPHS PCT: 18 % (ref 12–46)
Lymphs Abs: 1.5 10*3/uL (ref 0.7–4.0)
MCH: 31.7 pg (ref 26.0–34.0)
MCHC: 34.4 g/dL (ref 30.0–36.0)
MCV: 92 fL (ref 78.0–100.0)
Monocytes Absolute: 0.2 10*3/uL (ref 0.1–1.0)
Monocytes Relative: 3 % (ref 3–12)
Neutro Abs: 6.5 10*3/uL (ref 1.7–7.7)
Neutrophils Relative %: 79 % — ABNORMAL HIGH (ref 43–77)
Platelets: 227 10*3/uL (ref 150–400)
RBC: 4.39 MIL/uL (ref 3.87–5.11)
RDW: 12.5 % (ref 11.5–15.5)
WBC: 8.2 10*3/uL (ref 4.0–10.5)

## 2014-09-22 LAB — BASIC METABOLIC PANEL
Anion gap: 10 (ref 5–15)
BUN: 12 mg/dL (ref 6–20)
CHLORIDE: 104 mmol/L (ref 101–111)
CO2: 22 mmol/L (ref 22–32)
CREATININE: 0.79 mg/dL (ref 0.44–1.00)
Calcium: 9.4 mg/dL (ref 8.9–10.3)
GFR calc Af Amer: >60 mL/min (ref >60)
GFR calc non Af Amer: >60 mL/min (ref >60)
GLUCOSE: 169 mg/dL — AB (ref 65–99)
Potassium: 3.7 mmol/L (ref 3.5–5.1)
Sodium: 136 mmol/L (ref 135–145)

## 2014-09-22 LAB — POC URINE PREG, ED: Preg Test, Ur: NEGATIVE

## 2014-09-22 MED ORDER — PANTOPRAZOLE SODIUM 40 MG IV SOLR
40.0000 mg | Freq: Once | INTRAVENOUS | Status: AC
  Administered 2014-09-22: 40 mg via INTRAVENOUS
  Filled 2014-09-22: qty 40

## 2014-09-22 MED ORDER — RANITIDINE HCL 150 MG PO CAPS: 150.0000 mg | ORAL_CAPSULE | Freq: Every day | ORAL | Status: DC

## 2014-09-22 MED ORDER — PROMETHAZINE HCL 25 MG/ML IJ SOLN
12.5000 mg | Freq: Once | INTRAMUSCULAR | Status: AC
  Administered 2014-09-22: 12.5 mg via INTRAVENOUS
  Filled 2014-09-22: qty 1

## 2014-09-22 MED ORDER — PROMETHAZINE HCL 25 MG RE SUPP: 25.0000 mg | Freq: Four times a day (QID) | RECTAL | Status: DC | PRN

## 2014-09-22 MED ORDER — ONDANSETRON HCL 4 MG/2ML IJ SOLN
4.0000 mg | Freq: Once | INTRAMUSCULAR | Status: AC
  Administered 2014-09-22: 4 mg via INTRAVENOUS

## 2014-09-22 MED ORDER — ONDANSETRON HCL 4 MG/2ML IJ SOLN
INTRAMUSCULAR | Status: AC
  Administered 2014-09-22: 4 mg via INTRAVENOUS
  Filled 2014-09-22: qty 2

## 2014-09-22 MED ORDER — SODIUM CHLORIDE 0.9 % IV SOLN
1000.0000 mL | Freq: Once | INTRAVENOUS | Status: AC
  Administered 2014-09-22: 1000 mL via INTRAVENOUS

## 2014-09-22 MED ORDER — SODIUM CHLORIDE 0.9 % IV BOLUS (SEPSIS)
1000.0000 mL | Freq: Once | INTRAVENOUS | Status: AC
  Administered 2014-09-22: 1000 mL via INTRAVENOUS

## 2014-09-22 MED ORDER — ONDANSETRON 8 MG PO TBDP
8.0000 mg | ORAL_TABLET | Freq: Once | ORAL | Status: AC
  Administered 2014-09-22: 8 mg via ORAL
  Filled 2014-09-22: qty 1

## 2014-09-22 NOTE — ED Notes (Signed)
Patient dry heaving upon transferring to room. When told I was giving her Zofran she stated "That never helps, I need Phenergan." Informed her I could not give her Phenergan until seen by MD and ordered.

## 2014-09-22 NOTE — ED Notes (Signed)
Pt c/o nausea, Dr Estell Harpin notified, additional orders given

## 2014-09-22 NOTE — ED Provider Notes (Signed)
CSN: [id number]     Arrival date & time 09/22/14  1222 History   First MD Initiated Contact with Patient 09/22/14 1515     Chief Complaint  Patient presents with  . Emesis     (Consider location/radiation/quality/duration/timing/severity/associated sxs/prior Treatment) Patient is a 29 y.o. female presenting with vomiting. The history is provided by the patient (the pt complains of vomiting.  she states she vomits when she smokes Pot ).  Emesis Severity:  Moderate Timing:  Constant Quality:  Undigested food Able to tolerate:  Liquids Progression:  Worsening Chronicity:  Recurrent Recent urination:  Decreased Relieved by:  Nothing Associated symptoms: no abdominal pain, no diarrhea and no headaches     Past Medical History  Diagnosis Date  . Substance abuse     opiates, detox 05/2011  . Heart murmur   . Hx of nausea and vomiting   . Rhabdomyolysis   . Myonecrosis   . Hematuria    Past Surgical History  Procedure Laterality Date  . Back surgery      "tumor" removed from her back   Family History  Problem Relation Age of Onset  . Muscular dystrophy Other   . Lupus Other   . Heart disease Other    History  Substance Use Topics  . Smoking status: Current Every Day Smoker -- 1.00 packs/day for 10 years    Types: Cigarettes  . Smokeless tobacco: Never Used  . Alcohol Use: Yes     Comment: 3 bottles of wine   OB History    Gravida Para Term Preterm AB TAB SAB Ectopic Multiple Living   Review of Systems  Constitutional: Negative for appetite change and fatigue.  HENT: Negative for congestion, ear discharge and sinus pressure.   Eyes: Negative for discharge.  Respiratory: Negative for cough.   Cardiovascular: Negative for chest pain.  Gastrointestinal: Positive for vomiting. Negative for abdominal pain and diarrhea.  Genitourinary: Negative for frequency and hematuria.  Musculoskeletal: Negative for back pain.  Skin: Negative for rash.   Neurological: Negative for seizures and headaches.  Psychiatric/Behavioral: Negative for hallucinations.      Allergies  Hydrocodone and Tomato  Home Medications   Prior to Admission medications   Medication Sig Start Date End Date Taking? Authorizing Provider  baclofen (LIORESAL) 10 MG tablet Take 1 tablet (10 mg total) by mouth 3 (three) times daily. 09/12/14 10/12/14 Yes Ivery Quale, PA-C  Buprenorphine HCl-Naloxone HCl 8-2 MG FILM Place 1.5 Film under the tongue daily.   Yes Historical Provider, MD  citalopram (CELEXA) 20 MG tablet Take 40 mg by mouth daily.    Yes Historical Provider, MD  Cranberry-Vitamin C-Probiotic (AZO CRANBERRY) 250-30 MG TABS Take 2 tablets by mouth daily.   Yes Historical Provider, MD  ibuprofen (ADVIL,MOTRIN) 600 MG tablet Take 1 tablet (600 mg total) by mouth every 6 (six) hours as needed. 09/12/14   Ivery Quale, PA-C  metroNIDAZOLE (FLAGYL) 500 MG tablet Take 1 tablet (500 mg total) by mouth 2 (two) times daily. Patient not taking: Reported on 08/03/2014 05/05/14   Janne Napoleon, NP  naproxen (NAPROSYN) 500 MG tablet Take 1 tablet (500 mg total) by mouth 2 (two) times daily. Patient not taking: Reported on 08/30/2014 08/03/14   Glynn Octave, MD  oxyCODONE-acetaminophen (PERCOCET/ROXICET) 5-325 MG per tablet Take 2 tablets by mouth every 4 (four) hours as needed for moderate pain or severe pain. Patient not taking:  Reported on 08/03/2014 05/05/14   Janne Napoleon, NP  promethazine (PHENERGAN) 25 MG suppository Place 1 suppository (25 mg total) rectally every 6 (six) hours as needed for nausea or vomiting. 09/22/14   Bethann Berkshire, MD  ranitidine (ZANTAC) 150 MG capsule Take 1 capsule (150 mg total) by mouth daily. 09/22/14   Bethann Berkshire, MD   BP 134/84 mmHg  Pulse 70  Temp(Src) 98.6 F (37 C) (Oral)  Resp 14  Ht 5\' 2"  (1.575 m)  Wt 110 lb (49.896 kg)  BMI 20.11 kg/m2  SpO2 100%  LMP 09/22/2014 Physical Exam  Constitutional: She is oriented to person,  place, and time. She appears well-developed.  HENT:  Head: Normocephalic.  Eyes: Conjunctivae and EOM are normal. No scleral icterus.  Neck: Neck supple. No thyromegaly present.  Cardiovascular: Normal rate and regular rhythm.  Exam reveals no gallop and no friction rub.   No murmur heard. Pulmonary/Chest: No stridor. She has no wheezes. She has no rales. She exhibits no tenderness.  Abdominal: She exhibits no distension. There is no tenderness. There is no rebound.  Musculoskeletal: Normal range of motion. She exhibits no edema.  Lymphadenopathy:    She has no cervical adenopathy.  Neurological: She is oriented to person, place, and time. She exhibits normal muscle tone. Coordination normal.  Skin: No rash noted. No erythema.  Psychiatric: She has a normal mood and affect. Her behavior is normal.    ED Course  Procedures (including critical care time) Labs Review Labs Reviewed  CBC WITH DIFFERENTIAL/PLATELET - Abnormal; Notable for the following:    Neutrophils Relative % 79 (*)    All other components within normal limits  BASIC METABOLIC PANEL - Abnormal; Notable for the following:    Glucose, Bld 169 (*)    All other components within normal limits  POC URINE PREG, ED    Imaging Review No results found.   EKG Interpretation None      MDM   Final diagnoses:  Vomiting in adult patient    Vomiting,  Pt hydrated,  Improving.  tx with zantac and phenergan    Bethann Berkshire, MD 09/22/14 [phone]

## 2014-09-22 NOTE — Discharge Instructions (Signed)
Drink fluids only for today and follow up next week if not improving

## 2014-09-22 NOTE — ED Notes (Signed)
Vomited numerous times since about MN.  C/o abdominal pain, rates pain 9/10.

## 2014-10-07 DIAGNOSIS — T40601A Poisoning by unspecified narcotics, accidental (unintentional), initial encounter: Secondary | ICD-10-CM | POA: Insufficient documentation

## 2015-01-11 ENCOUNTER — Encounter (HOSPITAL_COMMUNITY): Payer: Self-pay

## 2015-01-11 ENCOUNTER — Emergency Department (HOSPITAL_COMMUNITY): Payer: Self-pay

## 2015-01-11 ENCOUNTER — Emergency Department (HOSPITAL_COMMUNITY)
Admission: EM | Admit: 2015-01-11 | Discharge: 2015-01-11 | Disposition: A | Discharge status: Home / Self Care | Payer: Self-pay | Attending: Emergency Medicine | Admitting: Emergency Medicine

## 2015-01-11 DIAGNOSIS — F419 Anxiety disorder, unspecified: Secondary | ICD-10-CM | POA: Insufficient documentation

## 2015-01-11 DIAGNOSIS — R0789 Other chest pain: Secondary | ICD-10-CM | POA: Insufficient documentation

## 2015-01-11 DIAGNOSIS — R0781 Pleurodynia: Secondary | ICD-10-CM | POA: Insufficient documentation

## 2015-01-11 DIAGNOSIS — R011 Cardiac murmur, unspecified: Secondary | ICD-10-CM | POA: Insufficient documentation

## 2015-01-11 DIAGNOSIS — Z8739 Personal history of other diseases of the musculoskeletal system and connective tissue: Secondary | ICD-10-CM | POA: Insufficient documentation

## 2015-01-11 DIAGNOSIS — Z720 Tobacco use: Secondary | ICD-10-CM | POA: Insufficient documentation

## 2015-01-11 DIAGNOSIS — Z79899 Other long term (current) drug therapy: Secondary | ICD-10-CM | POA: Insufficient documentation

## 2015-01-11 LAB — COMPREHENSIVE METABOLIC PANEL
ALT: 23 U/L (ref 14–54)
AST: 37 U/L (ref 15–41)
Albumin: 3.9 g/dL (ref 3.5–5.0)
Alkaline Phosphatase: 38 U/L (ref 38–126)
Anion gap: 7 (ref 5–15)
BILIRUBIN TOTAL: 0.4 mg/dL (ref 0.3–1.2)
BUN: 10 mg/dL (ref 6–20)
CALCIUM: 8.4 mg/dL — AB (ref 8.9–10.3)
CO2: 23 mmol/L (ref 22–32)
CREATININE: 0.7 mg/dL (ref 0.44–1.00)
Chloride: 107 mmol/L (ref 101–111)
GFR calc Af Amer: >60 mL/min (ref >60)
Glucose, Bld: 100 mg/dL — ABNORMAL HIGH (ref 65–99)
POTASSIUM: 3.6 mmol/L (ref 3.5–5.1)
Sodium: 137 mmol/L (ref 135–145)
TOTAL PROTEIN: 6.4 g/dL — AB (ref 6.5–8.1)

## 2015-01-11 LAB — CBC WITH DIFFERENTIAL/PLATELET
BASOS ABS: 0 10*3/uL (ref 0.0–0.1)
BASOS PCT: 0 %
EOS ABS: 0.2 10*3/uL (ref 0.0–0.7)
EOS PCT: 3 %
HCT: 32.6 % — ABNORMAL LOW (ref 36.0–46.0)
Hemoglobin: 11.2 g/dL — ABNORMAL LOW (ref 12.0–15.0)
Lymphocytes Relative: 53 %
Lymphs Abs: 3.2 10*3/uL (ref 0.7–4.0)
MCH: 32 pg (ref 26.0–34.0)
MCHC: 34.4 g/dL (ref 30.0–36.0)
MCV: 93.1 fL (ref 78.0–100.0)
Monocytes Absolute: 0.5 10*3/uL (ref 0.1–1.0)
Monocytes Relative: 9 %
Neutro Abs: 2.1 10*3/uL (ref 1.7–7.7)
Neutrophils Relative %: 35 %
PLATELETS: 160 10*3/uL (ref 150–400)
RBC: 3.5 MIL/uL — AB (ref 3.87–5.11)
RDW: 12.1 % (ref 11.5–15.5)
WBC: 6 10*3/uL (ref 4.0–10.5)

## 2015-01-11 LAB — TROPONIN I: Troponin I: <0.03 ng/mL (ref <0.031)

## 2015-01-11 LAB — D-DIMER, QUANTITATIVE (NOT AT ARMC)

## 2015-01-11 MED ORDER — NAPROXEN 375 MG PO TABS: ORAL_TABLET | ORAL | Status: DC

## 2015-01-11 MED ORDER — KETOROLAC TROMETHAMINE 60 MG/2ML IM SOLN
60.0000 mg | Freq: Once | INTRAMUSCULAR | Status: AC
  Administered 2015-01-11: 60 mg via INTRAMUSCULAR
  Filled 2015-01-11: qty 2

## 2015-01-11 NOTE — ED Notes (Signed)
Pt reports onset approx 0030 of sharp left chest tightness that is worse with deep breath and with movement.

## 2015-01-11 NOTE — ED Notes (Signed)
Pt appears much calmer at this time. No distress noted.

## 2015-01-11 NOTE — ED Provider Notes (Signed)
CSN: [id number]     Arrival date & time 01/11/15  0218 History   First MD Initiated Contact with Patient 01/11/15 [phone]   Chief Complaint  Patient presents with  . Chest Pain     (Consider location/radiation/quality/duration/timing/severity/associated sxs/prior Treatment) HPI patient reports 2 days ago she had some chest pain that lasted about 20 or 30 minutes. She states tonight she woke up about 12:30 AM and went to the bathroom. When she came back to bed she started having pain in her left upper chest that she states feels like it's behind her rib cage but is also sore on her chest wall when she touches it. She states it feels tight. The pain is also pleuritic and hurts with deep breathing or movement. She states she also feels short of breath. She states it was so bad she was crying and "I didn't even cry when I had my children". She states she had nausea and vomiting yesterday but not tonight. She denies coughing. She denies swelling or pain in her extremities. She denies any known injury. She states her pain is "almost gone" however she still is clutching her chest and seems very anxious.  PCP none  Past Medical History  Diagnosis Date  . Substance abuse     opiates, detox 05/2011  . Heart murmur   . Hx of nausea and vomiting   . Rhabdomyolysis   . Myonecrosis   . Hematuria    Past Surgical History  Procedure Laterality Date  . Back surgery      "tumor" removed from her back   Family History  Problem Relation Age of Onset  . Muscular dystrophy Other   . Lupus Other   . Heart disease Other    Social History  Substance Use Topics  . Smoking status: Current Every Day Smoker -- 1.00 packs/day for 10 years    Types: Cigarettes  . Smokeless tobacco: Never Used  . Alcohol Use: No     Comment: 3 bottles of wine   Last alcohol was 1 year ago Smokes 1/3 ppd unemployed  OB History    Gravida Para Term Preterm AB TAB SAB Ectopic Multiple Living   Review  of Systems  All other systems reviewed and are negative.     Allergies  Hydrocodone and Tomato  Home Medications   Prior to Admission medications   Medication Sig Start Date End Date Taking? Authorizing Provider  Buprenorphine HCl-Naloxone HCl 8-2 MG FILM Place 1.5 Film under the tongue daily.    Historical Provider, MD  citalopram (CELEXA) 20 MG tablet Take 40 mg by mouth daily.     Historical Provider, MD  Cranberry-Vitamin C-Probiotic (AZO CRANBERRY) 250-30 MG TABS Take 2 tablets by mouth daily.    Historical Provider, MD  ibuprofen (ADVIL,MOTRIN) 600 MG tablet Take 1 tablet (600 mg total) by mouth every 6 (six) hours as needed. 09/12/14   Ivery Quale, PA-C  metroNIDAZOLE (FLAGYL) 500 MG tablet Take 1 tablet (500 mg total) by mouth 2 (two) times daily. Patient not taking: Reported on 08/03/2014 05/05/14   Janne Napoleon, NP  naproxen (NAPROSYN) 375 MG tablet Take 1 po BID with food prn pain 01/11/15   Devoria Albe, MD  oxyCODONE-acetaminophen (PERCOCET/ROXICET) 5-325 MG per tablet Take 2 tablets by mouth every 4 (four) hours as needed for moderate pain or severe pain. Patient not taking: Reported on 08/03/2014 05/05/14   [facility]  Orlene Och, NP  promethazine (PHENERGAN) 25 MG suppository Place 1 suppository (25 mg total) rectally every 6 (six) hours as needed for nausea or vomiting. 09/22/14   Bethann Berkshire, MD  ranitidine (ZANTAC) 150 MG capsule Take 1 capsule (150 mg total) by mouth daily. 09/22/14   Bethann Berkshire, MD   BP 105/62 mmHg  Pulse 93  Temp(Src) 98.1 F (36.7 C) (Oral)  Resp 18  Ht  (1.626 m)  Wt 110 lb (49.896 kg)  BMI 18.87 kg/m2  SpO2 100%  LMP 12/31/2014  Vital signs normal   Physical Exam  Constitutional: She is oriented to person, place, and time.  Non-toxic appearance. She does not appear ill. No distress.  Thin  HENT:  Head: Normocephalic and atraumatic.  Right Ear: External ear normal.  Left Ear: External ear normal.  Nose: Nose normal. No mucosal edema or  rhinorrhea.  Mouth/Throat: Oropharynx is clear and moist and mucous membranes are normal. No dental abscesses or uvula swelling.  Eyes: Conjunctivae and EOM are normal. Pupils are equal, round, and reactive to light.  Neck: Normal range of motion and full passive range of motion without pain. Neck supple.  Cardiovascular: Normal rate, regular rhythm and normal heart sounds.  Exam reveals no gallop and no friction rub.   No murmur heard. Pulmonary/Chest: Effort normal and breath sounds normal. No respiratory distress. She has no wheezes. She has no rhonchi. She has no rales. She exhibits tenderness. She exhibits no crepitus.    Abdominal: Soft. Normal appearance and bowel sounds are normal. She exhibits no distension. There is no tenderness. There is no rebound and no guarding.  Musculoskeletal: Normal range of motion. She exhibits no edema or tenderness.  Moves all extremities well.   Neurological: She is alert and oriented to person, place, and time. She has normal strength. No cranial nerve deficit.  Skin: Skin is warm, dry and intact. No rash noted. No erythema. No pallor.  Psychiatric: Her behavior is normal. Her mood appears anxious. Her speech is rapid and/or pressured.  Nursing note and vitals reviewed.   ED Course  Procedures (including critical care time)  Medications  ketorolac (TORADOL) injection 60 mg (60 mg Intramuscular Given 01/11/15 0302)   We discussed her symptoms sound most like chest wall pain. Testing was done to rule out other etiologies such as myocardial event, pulmonary embolus, pneumonia.  Recheck at 3:48 AM patient calmly eating crackers and peanut butter. She states her pain is better. She was given her test results. Patient is relieved. She was discharged home.  Labs Review Results for orders placed or performed during the hospital encounter of 01/11/15  Troponin I  Result Value Ref Range   Troponin I <0.03 <0.031 ng/mL  Comprehensive metabolic panel    Result Value Ref Range   Sodium 137 135 - 145 mmol/L   Potassium 3.6 3.5 - 5.1 mmol/L   Chloride 107 101 - 111 mmol/L   CO2 23 22 - 32 mmol/L   Glucose, Bld 100 (H) 65 - 99 mg/dL   BUN 10 6 - 20 mg/dL   Creatinine, Ser 1.61 0.44 - 1.00 mg/dL   Calcium 8.4 (L) 8.9 - 10.3 mg/dL   Total Protein 6.4 (L) 6.5 - 8.1 g/dL   Albumin 3.9 3.5 - 5.0 g/dL   AST 37 15 - 41 U/L   ALT 23 14 - 54 U/L   Alkaline Phosphatase 38 38 - 126 U/L   Total Bilirubin 0.4 0.3 - 1.2 mg/dL  GFR calc non Af Amer >60 >60 mL/min   GFR calc Af Amer >60 >60 mL/min   Anion gap 7 5 - 15  CBC with Differential  Result Value Ref Range   WBC 6.0 4.0 - 10.5 K/uL   RBC 3.50 (L) 3.87 - 5.11 MIL/uL   Hemoglobin 11.2 (L) 12.0 - 15.0 g/dL   HCT 16.1 (L) 09.6 - 04.5 %   MCV 93.1 78.0 - 100.0 fL   MCH 32.0 26.0 - 34.0 pg   MCHC 34.4 30.0 - 36.0 g/dL   RDW 40.9 81.1 - 91.4 %   Platelets 160 150 - 400 K/uL   Neutrophils Relative % 35 %   Neutro Abs 2.1 1.7 - 7.7 K/uL   Lymphocytes Relative 53 %   Lymphs Abs 3.2 0.7 - 4.0 K/uL   Monocytes Relative 9 %   Monocytes Absolute 0.5 0.1 - 1.0 K/uL   Eosinophils Relative 3 %   Eosinophils Absolute 0.2 0.0 - 0.7 K/uL   Basophils Relative 0 %   Basophils Absolute 0.0 0.0 - 0.1 K/uL  D-dimer, quantitative  Result Value Ref Range   D-Dimer, Quant <0.27 0.00 - 0.48 ug/mL-FEU   Laboratory interpretation all normal except mild anemia     Imaging Review Dg Chest 2 View  01/11/2015   CLINICAL DATA:  Chest pain  EXAM: CHEST  2 VIEW  COMPARISON:  08/03/2014  FINDINGS: Normal heart size and mediastinal contours. Large volumes which may be from excellent inspiratory effort. No acute infiltrate or edema. No effusion or pneumothorax. No acute osseous findings.  IMPRESSION: Negative chest.   Electronically Signed   By: Marnee Spring M.D.   On: 01/11/2015 03:02   I have personally reviewed and evaluated these images and lab results as part of my medical decision-making.   EKG  Interpretation   Date/Time:  Friday January 11 2015 02:30:53 EDT Ventricular Rate:  105 PR Interval:  173 QRS Duration: 84 QT Interval:  339 QTC Calculation: 448 R Axis:   -129 Text Interpretation:  Sinus tachycardia Right axis deviation Borderline  low voltage, extremity leads Since last tracing rate faster (26 May 2014)  Confirmed by Media Pizzini  MD-I, Altie Savard ([id number]) on 01/11/2015 2:58:48 AM      MDM   Final diagnoses:  Chest wall pain  Pleuritic chest pain    New Prescriptions   NAPROXEN (NAPROSYN) 375 MG TABLET    Take 1 po BID with food prn pain    Plan discharge  Devoria Albe, MD, Concha Pyo, MD 01/11/15 [phone]

## 2015-01-11 NOTE — ED Notes (Signed)
Pt tolerating po intake at this time.  

## 2015-01-11 NOTE — Discharge Instructions (Signed)
Take the naproxen for pain (or you could take Aleve 2 tabs OTC twice a day). Use ice and heat for comfort. Recheck if you get a cough, fever, struggle to breathe or get worse.    Chest Wall Pain Chest wall pain is pain felt in or around the chest bones and muscles. It may take up to 6 weeks to get better. It may take longer if you are active. Chest wall pain can happen on its own. Other times, things like germs, injury, coughing, or exercise can cause the pain. HOME CARE   Avoid activities that make you tired or cause pain. Try not to use your chest, belly (abdominal), or side muscles. Do not use heavy weights.  Put ice on the sore area.  Put ice in a plastic bag.  Place a towel between your skin and the bag.  Leave the ice on for 15-20 minutes for the first 2 days.  Only take medicine as told by your doctor. GET HELP RIGHT AWAY IF:   You have more pain or are very uncomfortable.  You have a fever.  Your chest pain gets worse.  You have new problems.  You feel sick to your stomach (nauseous) or throw up (vomit).  You start to sweat or feel lightheaded.  You have a cough with mucus (phlegm).  You cough up blood. MAKE SURE YOU:   Understand these instructions.  Will watch your condition.  Will get help right away if you are not doing well or get worse. Document Released: 09/16/2007 Document Revised: 06/22/2011 Document Reviewed: 11/24/2010 [facility] Patient Information 2015 [place], [place]. This information is not intended to replace advice given to you by your health care provider. Make sure you discuss any questions you have with your health care provider.

## 2015-01-28 ENCOUNTER — Ambulatory Visit
Admission: EM | Admit: 2015-01-28 | Discharge: 2015-01-28 | Disposition: A | Discharge status: Home / Self Care | Payer: No Typology Code available for payment source | Source: Ambulatory Visit | Attending: Emergency Medicine | Admitting: Emergency Medicine

## 2015-01-28 ENCOUNTER — Encounter: Payer: Self-pay | Admitting: Emergency Medicine

## 2015-01-28 ENCOUNTER — Emergency Department
Admission: EM | Admit: 2015-01-28 | Discharge: 2015-01-28 | Disposition: A | Discharge status: Home / Self Care | Payer: Self-pay | Attending: Emergency Medicine | Admitting: Emergency Medicine

## 2015-01-28 DIAGNOSIS — Z720 Tobacco use: Secondary | ICD-10-CM | POA: Insufficient documentation

## 2015-01-28 DIAGNOSIS — Z79899 Other long term (current) drug therapy: Secondary | ICD-10-CM | POA: Insufficient documentation

## 2015-01-28 DIAGNOSIS — T7421XA Adult sexual abuse, confirmed, initial encounter: Secondary | ICD-10-CM | POA: Insufficient documentation

## 2015-01-28 DIAGNOSIS — Z79891 Long term (current) use of opiate analgesic: Secondary | ICD-10-CM | POA: Insufficient documentation

## 2015-01-28 DIAGNOSIS — Y9289 Other specified places as the place of occurrence of the external cause: Secondary | ICD-10-CM | POA: Insufficient documentation

## 2015-01-28 DIAGNOSIS — F419 Anxiety disorder, unspecified: Secondary | ICD-10-CM | POA: Insufficient documentation

## 2015-01-28 DIAGNOSIS — Z791 Long term (current) use of non-steroidal anti-inflammatories (NSAID): Secondary | ICD-10-CM | POA: Insufficient documentation

## 2015-01-28 DIAGNOSIS — Y9389 Activity, other specified: Secondary | ICD-10-CM | POA: Insufficient documentation

## 2015-01-28 DIAGNOSIS — Z3202 Encounter for pregnancy test, result negative: Secondary | ICD-10-CM | POA: Insufficient documentation

## 2015-01-28 DIAGNOSIS — Y998 Other external cause status: Secondary | ICD-10-CM | POA: Insufficient documentation

## 2015-01-28 LAB — POCT PREGNANCY, URINE: Preg Test, Ur: NEGATIVE

## 2015-01-28 MED ORDER — LORAZEPAM 1 MG PO TABS
1.0000 mg | ORAL_TABLET | Freq: Once | ORAL | Status: AC
  Administered 2015-01-28: 1 mg via ORAL
  Filled 2015-01-28: qty 1

## 2015-01-28 MED ORDER — METRONIDAZOLE 500 MG PO TABS
2000.0000 mg | ORAL_TABLET | ORAL | Status: AC
  Administered 2015-01-28: 2000 mg via ORAL

## 2015-01-28 MED ORDER — AZITHROMYCIN 1 G PO PACK
PACK | ORAL | Status: AC
Start: 2015-01-28 — End: 2015-01-28
  Administered 2015-01-28: 1 g
  Filled 2015-01-28: qty 1

## 2015-01-28 MED ORDER — CEFIXIME 400 MG PO CAPS
ORAL_CAPSULE | ORAL | Status: AC
  Administered 2015-01-28: 400 mg
  Filled 2015-01-28: qty 1

## 2015-01-28 MED ORDER — ULIPRISTAL ACETATE 30 MG PO TABS
ORAL_TABLET | ORAL | Status: AC
  Filled 2015-01-28: qty 1

## 2015-01-28 NOTE — ED Notes (Signed)
Patient with SANE RN. Will obtain repeat vitals when SANE RN complete with exam/evaluation.

## 2015-01-28 NOTE — ED Notes (Signed)
Report received.  NAD.  Pt is withdrawn and tearful.  Quiet.  Requested urine sample.  Patient unable to give at this time.  SANE RN has arrived.
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
## 2015-05-01 ENCOUNTER — Emergency Department (HOSPITAL_COMMUNITY)
Admission: EM | Admit: 2015-05-01 | Discharge: 2015-05-01 | Disposition: A | Discharge status: Home / Self Care | Payer: Self-pay | Attending: Emergency Medicine | Admitting: Emergency Medicine

## 2015-05-01 ENCOUNTER — Encounter (HOSPITAL_COMMUNITY): Payer: Self-pay | Admitting: *Deleted

## 2015-05-01 DIAGNOSIS — Z8739 Personal history of other diseases of the musculoskeletal system and connective tissue: Secondary | ICD-10-CM | POA: Insufficient documentation

## 2015-05-01 DIAGNOSIS — R011 Cardiac murmur, unspecified: Secondary | ICD-10-CM | POA: Insufficient documentation

## 2015-05-01 DIAGNOSIS — K0889 Other specified disorders of teeth and supporting structures: Secondary | ICD-10-CM | POA: Insufficient documentation

## 2015-05-01 DIAGNOSIS — K029 Dental caries, unspecified: Secondary | ICD-10-CM

## 2015-05-01 DIAGNOSIS — Z79899 Other long term (current) drug therapy: Secondary | ICD-10-CM | POA: Insufficient documentation

## 2015-05-01 DIAGNOSIS — F17210 Nicotine dependence, cigarettes, uncomplicated: Secondary | ICD-10-CM | POA: Insufficient documentation

## 2015-05-01 MED ORDER — BUPIVACAINE HCL (PF) 0.25 % IJ SOLN
10.0000 mL | Freq: Once | INTRAMUSCULAR | Status: AC
  Administered 2015-05-01: 10 mL
  Filled 2015-05-01: qty 30

## 2015-05-01 MED ORDER — PENICILLIN V POTASSIUM 250 MG PO TABS
500.0000 mg | ORAL_TABLET | Freq: Once | ORAL | Status: AC
  Administered 2015-05-01: 500 mg via ORAL
  Filled 2015-05-01: qty 2

## 2015-05-01 MED ORDER — IBUPROFEN 800 MG PO TABS: 800.0000 mg | ORAL_TABLET | Freq: Three times a day (TID) | ORAL | Status: DC | PRN

## 2015-05-01 MED ORDER — IBUPROFEN 800 MG PO TABS
800.0000 mg | ORAL_TABLET | Freq: Once | ORAL | Status: AC
  Administered 2015-05-01: 800 mg via ORAL
  Filled 2015-05-01: qty 1

## 2015-05-01 MED ORDER — PENICILLIN V POTASSIUM 500 MG PO TABS: 500.0000 mg | ORAL_TABLET | Freq: Four times a day (QID) | ORAL | Status: AC

## 2015-05-01 NOTE — Discharge Instructions (Signed)
Dental Care and Dentist Visits °Dental care supports good overall health. Regular dental visits can also help you avoid dental pain, bleeding, infection, and other more serious health problems in the future. It is important to keep the mouth healthy because diseases in the teeth, gums, and other oral tissues can spread to other areas of the body. Some problems, such as diabetes, heart disease, and pre-term labor have been associated with poor oral health.  °See your dentist every 6 months. If you experience emergency problems such as a toothache or broken tooth, go to the dentist right away. If you see your dentist regularly, you may catch problems early. It is easier to be treated for problems in the early stages.  °WHAT TO EXPECT AT A DENTIST VISIT  °Your dentist will look for many common oral health problems and recommend proper treatment. At your regular dental visit, you can expect: °· Gentle cleaning of the teeth and gums. This includes scraping and polishing. This helps to remove the sticky substance around the teeth and gums (plaque). Plaque forms in the mouth shortly after eating. Over time, plaque hardens on the teeth as tartar. If tartar is not removed regularly, it can cause problems. Cleaning also helps remove stains. °· Periodic X-rays. These pictures of the teeth and supporting bone will help your dentist assess the health of your teeth. °· Periodic fluoride treatments. Fluoride is a natural mineral shown to help strengthen teeth. Fluoride treatment involves applying a fluoride gel or varnish to the teeth. It is most commonly done in children. °· Examination of the mouth, tongue, jaws, teeth, and gums to look for any oral health problems, such as: °· Cavities (dental caries). This is decay on the tooth caused by plaque, sugar, and acid in the mouth. It is best to catch a cavity when it is small. °· Inflammation of the gums caused by plaque buildup (gingivitis). °· Problems with the mouth or malformed  or misaligned teeth. °· Oral cancer or other diseases of the soft tissues or jaws.  °KEEP YOUR TEETH AND GUMS HEALTHY °For healthy teeth and gums, follow these general guidelines as well as your dentist's specific advice: °· Have your teeth professionally cleaned at the dentist every 6 months. °· Brush twice daily with a fluoride toothpaste. °· Floss your teeth daily.  °· Ask your dentist if you need fluoride supplements, treatments, or fluoride toothpaste. °· Eat a healthy diet. Reduce foods and drinks with added sugar. °· Avoid smoking. °TREATMENT FOR ORAL HEALTH PROBLEMS °If you have oral health problems, treatment varies depending on the conditions present in your teeth and gums. °· Your caregiver will most likely recommend good oral hygiene at each visit. °· For cavities, gingivitis, or other oral health disease, your caregiver will perform a procedure to treat the problem. This is typically done at a separate appointment. Sometimes your caregiver will refer you to another dental specialist for specific tooth problems or for surgery. °SEEK IMMEDIATE DENTAL CARE IF: °· You have pain, bleeding, or soreness in the gum, tooth, jaw, or mouth area. °· A permanent tooth becomes loose or separated from the gum socket. °· You experience a blow or injury to the mouth or jaw area. °  °This information is not intended to replace advice given to you by your health care provider. Make sure you discuss any questions you have with your health care provider. °  °Document Released: 12/10/2010 Document Revised: 06/22/2011 Document Reviewed: 12/10/2010 °Elsevier Interactive Patient Education ©2016 Elsevier Inc. ° °Dental Caries °Dental   caries (also called tooth decay) is the most common oral disease. It can occur at any age but is more common in children and young adults.  °HOW DENTAL CARIES DEVELOPS  °The process of decay begins when bacteria and foods (particularly sugars and starches) combine in your mouth to produce plaque.  Plaque is a substance that sticks to the hard, outer surface of a tooth (enamel). The bacteria in plaque produce acids that attack enamel. These acids may also attack the root surface of a tooth (cementum) if it is exposed. Repeated attacks dissolve these surfaces and create holes in the tooth (cavities). If left untreated, the acids destroy the other layers of the tooth.  °RISK FACTORS °· Frequent sipping of sugary beverages.   °· Frequent snacking on sugary and starchy foods, especially those that easily get stuck in the teeth.   °· Poor oral hygiene.   °· Dry mouth.   °· Substance abuse such as methamphetamine abuse.   °· Broken or poor-fitting dental restorations.   °· Eating disorders.   °· Gastroesophageal reflux disease (GERD).   °· Certain radiation treatments to the head and neck. °SYMPTOMS °In the early stages of dental caries, symptoms are seldom present. Sometimes white, chalky areas may be seen on the enamel or other tooth layers. In later stages, symptoms may include: °· Pits and holes on the enamel. °· Toothache after sweet, hot, or cold foods or drinks are consumed. °· Pain around the tooth. °· Swelling around the tooth. °DIAGNOSIS  °Most of the time, dental caries is detected during a regular dental checkup. A diagnosis is made after a thorough medical and dental history is taken and the surfaces of your teeth are checked for signs of dental caries. Sometimes special instruments, such as lasers, are used to check for dental caries. Dental X-ray exams may be taken so that areas not visible to the eye (such as between the contact areas of the teeth) can be checked for cavities.  °TREATMENT  °If dental caries is in its early stages, it may be reversed with a fluoride treatment or an application of a remineralizing agent at the dental office. Thorough brushing and flossing at home is needed to aid these treatments. If it is in its later stages, treatment depends on the location and extent of tooth  destruction:  °· If a small area of the tooth has been destroyed, the destroyed area will be removed and cavities will be filled with a material such as gold, silver amalgam, or composite resin.   °· If a large area of the tooth has been destroyed, the destroyed area will be removed and a cap (crown) will be fitted over the remaining tooth structure.   °· If the center part of the tooth (pulp) is affected, a procedure called a root canal will be needed before a filling or crown can be placed.   °· If most of the tooth has been destroyed, the tooth may need to be pulled (extracted). °HOME CARE INSTRUCTIONS °You can prevent, stop, or reverse dental caries at home by practicing good oral hygiene. Good oral hygiene includes: °· Thoroughly cleaning your teeth at least twice a day with a toothbrush and dental floss.   °· Using a fluoride toothpaste. A fluoride mouth rinse may also be used if recommended by your dentist or health care provider.   °· Restricting the amount of sugary and starchy foods and sugary liquids you consume.   °· Avoiding frequent snacking on these foods and sipping of these liquids.   °· Keeping regular visits with a dentist for   checkups and cleanings. °PREVENTION  °· Practice good oral hygiene. °· Consider a dental sealant. A dental sealant is a coating material that is applied by your dentist to the pits and grooves of teeth. The sealant prevents food from being trapped in them. It may protect the teeth for several years. °· Ask about fluoride supplements if you live in a community without fluorinated water or with water that has a low fluoride content. Use fluoride supplements as directed by your dentist or health care provider. °· Allow fluoride varnish applications to teeth if directed by your dentist or health care provider. °  °This information is not intended to replace advice given to you by your health care provider. Make sure you discuss any questions you have with your health care  provider. °  °Document Released: 12/20/2001 Document Revised: 04/20/2014 Document Reviewed: 04/01/2012 °Elsevier Interactive Patient Education ©2016 Elsevier Inc. ° ° ° °Emergency Department Resource Guide °1) Find a Doctor and Pay Out of Pocket °Although you won't have to find out who is covered by your insurance plan, it is a good idea to ask around and get recommendations. You will then need to call the office and see if the doctor you have chosen will accept you as a new patient and what types of options they offer for patients who are self-pay. Some doctors offer discounts or will set up payment plans for their patients who do not have insurance, but you will need to ask so you aren't surprised when you get to your appointment. ° °2) Contact Your Local Health Department °Not all health departments have doctors that can see patients for sick visits, but many do, so it is worth a call to see if yours does. If you don't know where your local health department is, you can check in your phone book. The CDC also has a tool to help you locate your state's health department, and many state websites also have listings of all of their local health departments. ° °3) Find a Walk-in Clinic °If your illness is not likely to be very severe or complicated, you may want to try a walk in clinic. These are popping up all over the country in pharmacies, drugstores, and shopping centers. They're usually staffed by nurse practitioners or physician assistants that have been trained to treat common illnesses and complaints. They're usually fairly quick and inexpensive. However, if you have serious medical issues or chronic medical problems, these are probably not your best option. ° °No Primary Care Doctor: °- Call [facility] at  832-8000 - they can help you locate a primary care doctor that  accepts your insurance, provides certain services, etc. °- Physician Referral Service- 1-800-533-3463 ° °Chronic Pain  Problems: °Organization         Address  Phone   Notes  °[facility]  (336) 297-2271 Patients need to be referred by their primary care doctor.  ° °Medication Assistance: °Organization         Address  Phone   Notes  °[place] [facility] [address] °[place], [place] [address] (336) 641-8030 --Must be a resident of [place] °-- Must have NO insurance coverage whatsoever (no Medicaid/ Medicare, etc.) °-- The pt. MUST have a primary care doctor that directs their care regularly and follows them in the community °  °[facility]  (866) 331-1348   °[employer]  (888) 892-1162   ° °Agencies that provide inexpensive medical care: °Organization           Address  Phone   Notes  °[facility]  (336) 832-8035   °[facility]    (336) 832-7272   °[facility] [address] °[place], [place] [address] (336) 832-4777   °[facility] [place] [address], °[place] (336) 271-4999   °[facility]    (336) 373-0678   °[facility]    (336) 272-1050   °[facility] ° [address], [place] Phone:  (336) 832-4444, Fax:  (336) 832-4440 Hours of Operation:  9 am - 6 pm, M-F.  Also accepts Medicaid/Medicare and self-pay.  °[facility] ° [address], [place] Phone: (336) 832-3150, Fax: (336) 832-3151. Hours of Operation:  8:30 am - 5:30 pm, M-F.  Also accepts Medicaid and self-pay.  °[facility] [place] [address], [place] Phone: (336) 878-6027   °[facility] [address], [place], [place] (336)723-1848, Ext. 123 Mondays & Thursdays: 7-9 AM.  First 15 patients are seen on a first come, first serve basis. °  ° °Medicaid-accepting [place] Providers: ° °Organization         Address  Phone   Notes  °[facility] [address], [place] (336) 641-2100 Also  accepts self-pay patients.  °[facility] [address], [place] ° (336) 856-9996   °[facility] [address], [place] (336) 288-8857   °[facility] [address] [place] [address], [place] (336) 299-7000   °Veita Bland [address], [place]  ° (336) 373-1557 Only accepts [place] Access Medicaid patients after they have their name applied to their card.  ° °Self-Pay (no insurance) in [place]: ° °Organization         Address  Phone   Notes  °Sickle Cell Patients, [facility] [address], [place] (336) 832-1970   °[facility] [address], [place] (336) 832-4400   °[facility] ° [address], [place] (336) 992-4800   °[facility]/Dr. Osei-Bonsu ° [address] [place] [address], [place] or [address], [place] (336) [phone] Phone number for both [place] and [place] locations is the same.  °[facility] [address], [place] (336) 299-0000   °[facility] [address] [place] [address],  or [address] (336) 852-7530 °(336) 878-2260   °[facility] [address],  (336) 350-1642, phone; (336) 294-5005, fax Sees patients 1st and 3rd Saturday of every month.  Must not qualify for public or private insurance (i.e. Medicaid, Medicare, [place] Health Choice, Veterans' Benefits) • Household income should be no more than 200% of the poverty level •The clinic cannot treat you if you are pregnant or think you are pregnant • Sexually transmitted diseases are not treated at the clinic.  ° ° °Dental Care: °Organization         Address  Phone  Notes  °[facility] [address],  (336) 641-6152 Accepts children up to age 21 who are enrolled in Medicaid or [place] Health Choice; pregnant  women with a Medicaid card; and children who have applied for Medicaid or [place] Health Choice, but were declined, whose parents can pay a reduced fee at time   of service.  °[facility] [place]  [address], [place] (336) 641-7733 Accepts children up to age 21 who are enrolled in Medicaid or [place] Health Choice; pregnant women with a Medicaid card; and children who have applied for Medicaid or [place] Health Choice, but were declined, whose parents can pay a reduced fee at time of service.  °[facility] ° [address], [place] (336) 641-4533 Patients are seen by appointment only. Walk-ins are not accepted. [facility] will see patients 18 years of age and older. °Monday - Tuesday (8am-5pm) °Most Wednesdays (8:30-5pm) °$30 per visit, cash only  °[facility] ° [address], [place] (336) 641-4533 Patients are seen by appointment only. Walk-ins are not accepted. [facility] will see patients 18 years of age and older. °One Wednesday Evening (Monthly: Volunteer Based).  $30 per visit, cash only  °[facility]  (919) [phone] for adults; Children under age 4, call Graduate Pediatric Dentistry at ([phone]) [phone]. Children aged 4-14, please call ([phone]) [phone] to request a pediatric application. ° Dental services are provided in all areas of dental care including fillings, crowns and bridges, complete and partial dentures, implants, gum treatment, root canals, and extractions. Preventive care is also provided. Treatment is provided to both adults and children. °Patients are selected via a lottery and there is often a waiting list. °  °[facility] [address], °[place] ° (336) 763-8833 [email or web address] °  °[facility] [address], [place], [place] (336)723-1848, Ext. 123 Second and Fourth Thursday of each month, opens at 6:30 AM; Clinic ends at 9 AM.  Patients are  seen on a first-come first-served basis, and a limited number are seen during each clinic.  ° °[facility] ° [address], [place], [place] (336) 723-7904   Eligibility Requirements °You must have lived in [place], [place], or [place] counties for at least the last three months. °  You cannot be eligible for state or federal sponsored healthcare insurance, including Veterans Administration, Medicaid, or Medicare. °  You generally cannot be eligible for healthcare insurance through your employer.  °  How to apply: °Eligibility screenings are held every Tuesday and Wednesday afternoon from 1:00 pm until 4:00 pm. You do not need an appointment for the interview!  °[facility] [address], [place], [place] 336-631-2330   °[facility]  336-342-8273   °[facility]  336-703-3100   °[facility]  336-570-6415   ° °Behavioral Health Resources in the Community: °Intensive Outpatient Programs °Organization         Address  Phone  Notes  °[facility] [address], [place], [place] 336-878-6098   °[facility] [address], [place], [place] 336-832-9800   °[facility] [address], [place], [place] ° 336-882-2125   °[facility] [address],  °, [place] 1-800-853-5163 or 336-641-4981   °Substance Abuse Resources °Organization         Address  Phone  Notes  °[facility]  336-882-2125   °[facility]  336-784-9470   °[facility]  336-285-9073   °[facility]  336-845-3988   °Residential & Outpatient Substance Abuse Program  1-800-659-3381   °Psychological Services °Organization         Address  Phone    Notes  °[facility]  336- 832-9600   °[facility]  336- 378-7881   °[facility] [address], [place] 1-800-853-5163 or 336-641-4981   ° °Mobile Crisis  Teams °Organization         Address  Phone  Notes  °[facility], Mobile Crisis Care Unit  1-877-626-1772   °Assertive °[facility] ° [address] [place], [place] 336-834-9664   °Sharon DeEsch [address] °[place] 336-554-5454   ° °Self-Help/Support Groups °Organization         Address  Phone             Notes  °[facility] - variety of support groups  336- 373-1402 Call for more information  °Narcotics Anonymous (NA), [facility] [address], °[place]  2 meetings at this location  ° °Residential Treatment Programs °Organization         Address  Phone  Notes  °[facility] [address],    °[place]  1-866-801-8205   °[facility] ° [address], [place], [place] 704-293-8524   °[facility] [address], [place] 336-845-3988 Admissions: 8am-3pm M-F  °[facility] [address]    °[place], [place] 336-841-1104   °[facility] [address], [place], [place] 336-379-7146   °[facility] [address]  °[place], [place] 336-285-9073   °[facility] Intensive Outpatient [address], [place], [place] 336-852-3033   °[facility] [address]  °[place], [place] 1-877-615-2722 or 336-784-9470   °[facility] [address] [place], [place] 336-227-7417 Accepts Medicaid  °[facility] [address]  ° [place] 1-800-659-3381 Substance Abuse/Addiction Treatment  ° °[place] Behavioral Health Resources °Organization         Address  Phone  Notes  °[facility]  (888) 581-9988   °Julie Brannon, PhD [address] [place], [place]   (336) 349-5553 or (336) 951-0000   °[facility]   [address] °[place], [place] (336) 349-4454   °[facility] [address], [place], [place] (336) 342-8316  Insurance/Medicaid/sponsorship through [facility]  °[facility] [address]                                    [place], [place] (336) 342-8316 Therapy/tele-psych/case  °[facility] [address]  ° [place], [place] (336) 349-2233    °Dr. Arfeen  (336) 349-4544   °[facility] [place]  [employer] [place] [facility] 1) [address], [place] °2) [address], [place] °3)  [address], [place] (336) 349-3220 °(336) 342-7768 ° °(336) 342-8140   °[place] [facility] (336) 342-1394 or (336) 342-3537 (After Hours)    ° ° ° °

## 2015-05-01 NOTE — ED Notes (Signed)
Pt reporting recurrent dental abscess on right side.  Pt reporting unable to see dentist at this time due to financial reasons.

## 2015-05-01 NOTE — ED Provider Notes (Signed)
TIME SEEN: 6:30 AM  CHIEF COMPLAINT: Dental pain  HPI: Pt is a 30 y.o. female with history of substance abuse who presents to the emergency department with right lower dental pain for the past 2 months is been intermittent. Also reports that she has had right-sided facial swelling over the past several days that has improved. States she is using Aleve and Orajel over-the-counter. Denies fevers. States she has an appointment with a dentist in 2 weeks. No vomiting or diarrhea. She is not a diabetic. She is a smoker.  ROS: See HPI Constitutional: no fever  Eyes: no drainage  ENT: no runny nose   Cardiovascular:  no chest pain  Resp: no SOB  GI: no vomiting GU: no dysuria Integumentary: no rash  Allergy: no hives  Musculoskeletal: no leg swelling  Neurological: no slurred speech ROS otherwise negative  PAST MEDICAL HISTORY/PAST SURGICAL HISTORY:  Past Medical History  Diagnosis Date  . Substance abuse     opiates, detox 05/2011  . Heart murmur   . Hx of nausea and vomiting   . Rhabdomyolysis   . Myonecrosis   . Hematuria     MEDICATIONS:  Prior to Admission medications   Medication Sig Start Date End Date Taking? Authorizing Provider  Buprenorphine HCl-Naloxone HCl 8-2 MG FILM Place 1.5 Film under the tongue daily.    Historical Provider, MD  citalopram (CELEXA) 20 MG tablet Take 40 mg by mouth daily.     Historical Provider, MD  Cranberry-Vitamin C-Probiotic (AZO CRANBERRY) 250-30 MG TABS Take 2 tablets by mouth daily.    Historical Provider, MD  ibuprofen (ADVIL,MOTRIN) 600 MG tablet Take 1 tablet (600 mg total) by mouth every 6 (six) hours as needed. 09/12/14   Ivery Quale, PA-C  metroNIDAZOLE (FLAGYL) 500 MG tablet Take 1 tablet (500 mg total) by mouth 2 (two) times daily. Patient not taking: Reported on 08/03/2014 05/05/14   Janne Napoleon, NP  naproxen (NAPROSYN) 375 MG tablet Take 1 po BID with food prn pain 01/11/15   Devoria Albe, MD  oxyCODONE-acetaminophen (PERCOCET/ROXICET)  5-325 MG per tablet Take 2 tablets by mouth every 4 (four) hours as needed for moderate pain or severe pain. Patient not taking: Reported on 08/03/2014 05/05/14   Janne Napoleon, NP  promethazine (PHENERGAN) 25 MG suppository Place 1 suppository (25 mg total) rectally every 6 (six) hours as needed for nausea or vomiting. 09/22/14   Bethann Berkshire, MD  ranitidine (ZANTAC) 150 MG capsule Take 1 capsule (150 mg total) by mouth daily. 09/22/14   Bethann Berkshire, MD    ALLERGIES:  Allergies  Allergen Reactions  . Hydrocodone Itching and Nausea And Vomiting    Patient states that she gets a metal taste in her mouth.  . Tomato Other (See Comments)    Too acidic, gives patient sores in mouth     SOCIAL HISTORY:  Social History  Substance Use Topics  . Smoking status: Current Every Day Smoker -- 1.00 packs/day for 10 years    Types: Cigarettes  . Smokeless tobacco: Never Used  . Alcohol Use: No     Comment: 3 bottles of wine    FAMILY HISTORY: Family History  Problem Relation Age of Onset  . Muscular dystrophy Other   . Lupus Other   . Heart disease Other     EXAM: BP 114/81 mmHg  Pulse 96  Temp(Src) 98.1 F (36.7 C) (Oral)  Resp 16  Ht  (1.626 m)  Wt 110 lb (49.896 kg)  BMI 18.87 kg/m2  SpO2 98% CONSTITUTIONAL: Alert and oriented and responds appropriately to questions. Well-appearing; well-nourished HEAD: Normocephalic EYES: Conjunctivae clear, PERRL ENT: normal nose; no rhinorrhea; moist mucous membranes; pharynx without lesions noted; no tonsillar hypertrophy or exudate, no uvular deviation, no trismus or drooling, normal phonation, no stridor, no dental abscess noted, no Ludwig's angina, tongue sits flat in the bottom of the mouth; patient has a crown on the right third molar and is tender to palpation over this area with a small amount of gingival irritation NECK: Supple, no meningismus, no LAD  CARD: RRR; S1 and S2 appreciated; no murmurs, no clicks, no rubs, no  gallops RESP: Normal chest excursion without splinting or tachypnea; breath sounds clear and equal bilaterally; no wheezes, no rhonchi, no rales, no hypoxia or respiratory distress, speaking full sentences ABD/GI: Normal bowel sounds; non-distended; soft, non-tender, no rebound, no guarding, no peritoneal signs BACK:  The back appears normal and is non-tender to palpation, there is no CVA tenderness EXT: Normal ROM in all joints; non-tender to palpation; no edema; normal capillary refill; no cyanosis, no calf tenderness or swelling    SKIN: Normal color for age and race; warm NEURO: Moves all extremities equally, sensation to light touch intact diffusely, cranial nerves II through XII intact PSYCH: The patient's mood and manner are appropriate. Grooming and personal hygiene are appropriate.  MEDICAL DECISION MAKING: Patient here with dental caries causing dental pain. No sign of drainable abscess on exam. No Ludwig's angina. Afebrile, nontoxic, hemodynamically stable. Normal voice, swallowing her secretions without difficulty, no respiratory distress or hypoxia. She states she has outpatient follow-up with a dentist. Maryclare Labrador also provide her with resource guide. Discussed with patient that we no longer provide narcotics for uncomplicated dental pain. Have recommended continuing ibuprofen, oral gel. Will discharge on penicillin 4 times a day for 10 days. Given dose in the emergency department. She has agreed to a dental block which has provided good pain relief. Discussed return precautions. She verbalized understanding and is comfortable with this plan.     NERVE BLOCK Performed by: Raelyn Number Consent: Verbal consent obtained. Required items: required blood products, implants, devices, and special equipment available Time out: Immediately prior to procedure a "time out" was called to verify the correct patient, procedure, equipment, support staff and site/side marked as required.  Indication:  dental pain Nerve block body site: Inferior alveolar nerve   Preparation: Patient was prepped and draped in the usual sterile fashion. Needle gauge: 24 G Location technique: anatomical landmarks  Local anesthetic: Bupivacaine 0.5%   Anesthetic total: 5 ml  Outcome: pain improved Patient tolerance: Patient tolerated the procedure well with no immediate complications.    Layla Maw Sarabeth Benton, DO 05/01/15 [phone]

## 2015-05-01 NOTE — Care Management (Signed)
CM contacted by ED RN. Pt came back into the ED (was DC'd this AM) pt has no ins and is requesting MATCH voucher. Pt has not been given MATCH voucher in the past year. Pt has no ins. Pt explained that her RX is $4 at [facility] but pt insists she needs the [facility] voucher, explained to pt that she will not be eligible for another voucher if she needs another Rx in the next year. No further CM needs.

## 2015-07-03 ENCOUNTER — Emergency Department (HOSPITAL_COMMUNITY)
Admission: EM | Admit: 2015-07-03 | Discharge: 2015-07-03 | Disposition: A | Discharge status: Home / Self Care | Payer: Medicaid Other | Attending: Emergency Medicine | Admitting: Emergency Medicine

## 2015-07-03 ENCOUNTER — Emergency Department (HOSPITAL_COMMUNITY): Payer: Medicaid Other

## 2015-07-03 ENCOUNTER — Encounter (HOSPITAL_COMMUNITY): Payer: Self-pay | Admitting: Emergency Medicine

## 2015-07-03 DIAGNOSIS — Z3A09 9 weeks gestation of pregnancy: Secondary | ICD-10-CM | POA: Diagnosis not present

## 2015-07-03 DIAGNOSIS — O210 Mild hyperemesis gravidarum: Secondary | ICD-10-CM | POA: Insufficient documentation

## 2015-07-03 DIAGNOSIS — O26899 Other specified pregnancy related conditions, unspecified trimester: Secondary | ICD-10-CM | POA: Diagnosis not present

## 2015-07-03 DIAGNOSIS — F17210 Nicotine dependence, cigarettes, uncomplicated: Secondary | ICD-10-CM | POA: Diagnosis not present

## 2015-07-03 DIAGNOSIS — O219 Vomiting of pregnancy, unspecified: Secondary | ICD-10-CM | POA: Diagnosis present

## 2015-07-03 DIAGNOSIS — R109 Unspecified abdominal pain: Secondary | ICD-10-CM | POA: Diagnosis not present

## 2015-07-03 LAB — URINALYSIS, ROUTINE W REFLEX MICROSCOPIC
Bilirubin Urine: NEGATIVE
Glucose, UA: NEGATIVE mg/dL
Hgb urine dipstick: NEGATIVE
Ketones, ur: >80 mg/dL — AB
NITRITE: NEGATIVE
PH: 8 (ref 5.0–8.0)
Protein, ur: 30 mg/dL — AB
SPECIFIC GRAVITY, URINE: 1.01 (ref 1.005–1.030)

## 2015-07-03 LAB — COMPREHENSIVE METABOLIC PANEL
ALT: 23 U/L (ref 14–54)
ANION GAP: 9 (ref 5–15)
AST: 21 U/L (ref 15–41)
Albumin: 4.5 g/dL (ref 3.5–5.0)
Alkaline Phosphatase: 52 U/L (ref 38–126)
BUN: 12 mg/dL (ref 6–20)
CO2: 19 mmol/L — ABNORMAL LOW (ref 22–32)
Calcium: 8.6 mg/dL — ABNORMAL LOW (ref 8.9–10.3)
Chloride: 109 mmol/L (ref 101–111)
Creatinine, Ser: 0.69 mg/dL (ref 0.44–1.00)
GFR calc Af Amer: >60 mL/min (ref >60)
GFR calc non Af Amer: >60 mL/min (ref >60)
Glucose, Bld: 133 mg/dL — ABNORMAL HIGH (ref 65–99)
POTASSIUM: 3.6 mmol/L (ref 3.5–5.1)
SODIUM: 137 mmol/L (ref 135–145)
TOTAL PROTEIN: 7.4 g/dL (ref 6.5–8.1)
Total Bilirubin: 0.7 mg/dL (ref 0.3–1.2)

## 2015-07-03 LAB — HCG, QUANTITATIVE, PREGNANCY: HCG, BETA CHAIN, QUANT, S: 18953 m[IU]/mL — AB (ref <5)

## 2015-07-03 LAB — CBC
HEMATOCRIT: 36.5 % (ref 36.0–46.0)
HEMOGLOBIN: 12.8 g/dL (ref 12.0–15.0)
MCH: 31.8 pg (ref 26.0–34.0)
MCHC: 35.1 g/dL (ref 30.0–36.0)
MCV: 90.6 fL (ref 78.0–100.0)
Platelets: 221 10*3/uL (ref 150–400)
RBC: 4.03 MIL/uL (ref 3.87–5.11)
RDW: 12.1 % (ref 11.5–15.5)
WBC: 8.1 10*3/uL (ref 4.0–10.5)

## 2015-07-03 LAB — URINE MICROSCOPIC-ADD ON: RBC / HPF: NONE SEEN RBC/hpf (ref 0–5)

## 2015-07-03 LAB — LIPASE, BLOOD: LIPASE: 22 U/L (ref 11–51)

## 2015-07-03 MED ORDER — DIAZEPAM 5 MG/ML IJ SOLN
2.5000 mg | Freq: Once | INTRAMUSCULAR | Status: AC
  Administered 2015-07-03: 2.5 mg via INTRAVENOUS
  Filled 2015-07-03: qty 2

## 2015-07-03 MED ORDER — MORPHINE SULFATE (PF) 4 MG/ML IV SOLN
4.0000 mg | Freq: Once | INTRAVENOUS | Status: AC
  Administered 2015-07-03: 4 mg via INTRAVENOUS
  Filled 2015-07-03: qty 1

## 2015-07-03 MED ORDER — PROMETHAZINE HCL 25 MG/ML IJ SOLN
12.5000 mg | Freq: Once | INTRAMUSCULAR | Status: AC
  Administered 2015-07-03: 12.5 mg via INTRAVENOUS
  Filled 2015-07-03: qty 1

## 2015-07-03 MED ORDER — ONDANSETRON 4 MG PO TBDP: ORAL_TABLET | ORAL | Status: DC

## 2015-07-03 MED ORDER — PROMETHAZINE HCL 25 MG RE SUPP: RECTAL | Status: DC

## 2015-07-03 MED ORDER — PROMETHAZINE HCL 25 MG/ML IJ SOLN
25.0000 mg | Freq: Once | INTRAMUSCULAR | Status: AC
  Administered 2015-07-03: 25 mg via INTRAVENOUS
  Filled 2015-07-03: qty 1

## 2015-07-03 MED ORDER — SODIUM CHLORIDE 0.9 % IV BOLUS (SEPSIS)
1000.0000 mL | Freq: Once | INTRAVENOUS | Status: AC
  Administered 2015-07-03: 1000 mL via INTRAVENOUS

## 2015-07-03 MED ORDER — PROMETHAZINE HCL 25 MG RE SUPP
25.0000 mg | Freq: Four times a day (QID) | RECTAL | Status: DC | PRN
  Administered 2015-07-03: 25 mg via RECTAL

## 2015-07-03 MED ORDER — SODIUM CHLORIDE 0.9 % IV SOLN
1000.0000 mL | Freq: Once | INTRAVENOUS | Status: AC
  Administered 2015-07-03: 1000 mL via INTRAVENOUS

## 2015-07-03 MED ORDER — PROMETHAZINE HCL 25 MG RE SUPP
RECTAL | Status: AC
  Administered 2015-07-03: 25 mg via RECTAL
  Filled 2015-07-03: qty 2

## 2015-07-03 MED ORDER — ONDANSETRON HCL 4 MG/2ML IJ SOLN
4.0000 mg | Freq: Once | INTRAMUSCULAR | Status: AC
Start: 2015-07-03 — End: 2015-07-03
  Administered 2015-07-03: 4 mg via INTRAVENOUS
  Filled 2015-07-03: qty 2

## 2015-07-03 NOTE — Discharge Instructions (Signed)
Follow-up with Dr. Despina Hiddeneure or Dr. Emelda FearFerguson this week

## 2015-07-03 NOTE — ED Notes (Signed)
Pt states she took home pregnancy test 2 days ago and it was positive.  Has been vomiting every morning for a few weeks, but today will not stop.

## 2015-07-03 NOTE — ED Provider Notes (Signed)
CSN: [id number]     Arrival date & time 07/03/15  1224 History   First MD Initiated Contact with Patient 07/03/15 1412     Chief Complaint  Patient presents with  . Emesis During Pregnancy     (Consider location/radiation/quality/duration/timing/severity/associated sxs/prior Treatment) Patient is a 30 y.o. female presenting with vomiting. The history is provided by the patient (Patient complains of vomiting. She states when she's pregnant she's has loss of vomiting usually).  Emesis Severity:  Moderate Timing:  Constant Quality:  Bilious material Able to tolerate:  Liquids Progression:  Unchanged Chronicity:  Recurrent Recent urination:  Increased Relieved by:  Nothing Worsened by:  Nothing tried Associated symptoms: abdominal pain   Associated symptoms: no diarrhea and no headaches     Past Medical History  Diagnosis Date  . Substance abuse     opiates, detox 05/2011  . Heart murmur   . Hx of nausea and vomiting   . Rhabdomyolysis   . Myonecrosis   . Hematuria    Past Surgical History  Procedure Laterality Date  . Back surgery      "tumor" removed from her back   Family History  Problem Relation Age of Onset  . Muscular dystrophy Other   . Lupus Other   . Heart disease Other    Social History  Substance Use Topics  . Smoking status: Current Every Day Smoker -- 1.00 packs/day for 10 years    Types: Cigarettes  . Smokeless tobacco: Never Used  . Alcohol Use: No     Comment: 3 bottles of wine   OB History    Gravida Para Term Preterm AB TAB SAB Ectopic Multiple Living   3 2 2       2      Review of Systems  Constitutional: Negative for appetite change and fatigue.  HENT: Negative for congestion, ear discharge and sinus pressure.   Eyes: Negative for discharge.  Respiratory: Negative for cough.   Cardiovascular: Negative for chest pain.  Gastrointestinal: Positive for vomiting and abdominal pain. Negative for diarrhea.  Genitourinary: Negative for  frequency and hematuria.  Musculoskeletal: Negative for back pain.  Skin: Negative for rash.  Neurological: Negative for seizures and headaches.  Psychiatric/Behavioral: Negative for hallucinations.      Allergies  Hydrocodone and Tomato  Home Medications   Prior to Admission medications   Medication Sig Start Date End Date Taking? Authorizing Provider  ibuprofen (ADVIL,MOTRIN) 800 MG tablet Take 1 tablet (800 mg total) by mouth every 8 (eight) hours as needed for mild pain. Patient not taking: Reported on 07/03/2015 05/01/15   Layla MawKristen N Ward, DO  ondansetron (ZOFRAN ODT) 4 MG disintegrating tablet 4mg  ODT q4 hours prn nausea/vomit 07/03/15   Bethann BerkshireJoseph Zyion Doxtater, MD   BP 110/64 mmHg  Pulse 84  Temp(Src) 98.2 F (36.8 C) (Oral)  Resp 16  Ht 5\' 4"  (1.626 m)  Wt 125 lb (56.7 kg)  BMI 21.45 kg/m2  SpO2 100%  LMP 01/22/2015 (Exact Date) Physical Exam  Constitutional: She is oriented to person, place, and time. She appears well-developed.  HENT:  Head: Normocephalic.  Eyes: Conjunctivae and EOM are normal. No scleral icterus.  Neck: Neck supple. No thyromegaly present.  Cardiovascular: Normal rate and regular rhythm.  Exam reveals no gallop and no friction rub.   No murmur heard. Pulmonary/Chest: No stridor. She has no wheezes. She has no rales. She exhibits no tenderness.  Abdominal: She exhibits no distension. There is tenderness. There is no rebound.  Musculoskeletal:  Normal range of motion. She exhibits no edema.  Lymphadenopathy:    She has no cervical adenopathy.  Neurological: She is oriented to person, place, and time. She exhibits normal muscle tone. Coordination normal.  Skin: No rash noted. No erythema.  Psychiatric: She has a normal mood and affect. Her behavior is normal.    ED Course  Procedures (including critical care time) Labs Review Labs Reviewed  COMPREHENSIVE METABOLIC PANEL - Abnormal; Notable for the following:    CO2 19 (*)    Glucose, Bld 133 (*)     Calcium 8.6 (*)    All other components within normal limits  URINALYSIS, ROUTINE W REFLEX MICROSCOPIC (NOT AT [facility]) - Abnormal; Notable for the following:    APPearance HAZY (*)    Ketones, ur >80 (*)    Protein, ur 30 (*)    Leukocytes, UA TRACE (*)    All other components within normal limits  HCG, QUANTITATIVE, PREGNANCY - Abnormal; Notable for the following:    hCG, Beta Chain, Quant, S 18953 (*)    All other components within normal limits  URINE MICROSCOPIC-ADD ON - Abnormal; Notable for the following:    Squamous Epithelial / LPF TOO NUMEROUS TO COUNT (*)    Bacteria, UA FEW (*)    All other components within normal limits  LIPASE, BLOOD  CBC    Imaging Review US Ob Comp Less 14 Wks  07/03/2015  CLINICAL DATA:  Vomiting, pregnant EXAM: OBSTETRIC <14 WK [place] AND TRANSVAGINAL OB US TECHNIQUE: Both transabdominal and transvaginal ultrasound examinations were performed for complete evaluation of the gestation as well as the maternal uterus, adnexal regions, and pelvic cul-de-sac. Transvaginal technique was performed to assess early pregnancy. COMPARISON:  None. FINDINGS: Intrauterine gestational sac: Visualized/normal in shape. Yolk sac:  Present Embryo:  Not present Cardiac Activity: Not present MSD: 15.2  mm   6 w   2  d Subchorionic hemorrhage:  None visualized. Maternal uterus/adnexae: No adnexal mass. Normal bilateral ovaries. Small pelvic free fluid. IMPRESSION: 1. Probable early intrauterine gestational sac, but fetal pole or cardiac activity yet visualized. Recommend follow-up quantitative B-HCG levels and follow-up US in 14 days to confirm and assess viability. This recommendation follows SRU consensus guidelines: Diagnostic Criteria for Nonviable Pregnancy Early in the First Trimester. Malva Limes Med 2013; 161:0960-45. Electronically Signed   By: Elige Ko   On: 07/03/2015 17:51   US Ob Transvaginal  07/03/2015  CLINICAL DATA:  Vomiting, pregnant EXAM: OBSTETRIC <14 WK [place] AND  TRANSVAGINAL OB US TECHNIQUE: Both transabdominal and transvaginal ultrasound examinations were performed for complete evaluation of the gestation as well as the maternal uterus, adnexal regions, and pelvic cul-de-sac. Transvaginal technique was performed to assess early pregnancy. COMPARISON:  None. FINDINGS: Intrauterine gestational sac: Visualized/normal in shape. Yolk sac:  Present Embryo:  Not present Cardiac Activity: Not present MSD: 15.2  mm   6 w   2  d Subchorionic hemorrhage:  None visualized. Maternal uterus/adnexae: No adnexal mass. Normal bilateral ovaries. Small pelvic free fluid. IMPRESSION: 1. Probable early intrauterine gestational sac, but fetal pole or cardiac activity yet visualized. Recommend follow-up quantitative B-HCG levels and follow-up US in 14 days to confirm and assess viability. This recommendation follows SRU consensus guidelines: Diagnostic Criteria for Nonviable Pregnancy Early in the First Trimester. Malva Limes Med 2013; 409:8119-14. Electronically Signed   By: Elige Ko   On: 07/03/2015 17:51   I have personally reviewed and evaluated these images and lab results  as part of my medical decision-making.   EKG Interpretation None      MDM   Final diagnoses:  Hyperemesis gravidarum   Hyperemesis gravidarum.  Patient improved with nausea medicine and fluids and will follow-up with her OB/GYN.    Bethann Berkshire, MD 07/03/15 1944

## 2015-07-03 NOTE — ED Notes (Signed)
Pt requesting more medication for nausea. Dr. Estell HarpinZammit notified.

## 2015-07-03 NOTE — ED Notes (Signed)
Dr. Zammit at bedside updating patient and family. 

## 2015-07-03 NOTE — ED Notes (Signed)
Pt continues to dry heave

## 2015-07-03 NOTE — ED Notes (Signed)
Pt continues to have vomiting after zofran administration.

## 2015-07-03 NOTE — ED Notes (Signed)
Dr Zammit at bedside. 

## 2015-07-03 NOTE — ED Notes (Signed)
Pt continues to vomit after phenergan administration.

## 2015-07-04 ENCOUNTER — Emergency Department (HOSPITAL_COMMUNITY)
Admission: EM | Admit: 2015-07-04 | Discharge: 2015-07-05 | Discharge status: Against Medical Advice | Payer: Medicaid Other | Attending: Emergency Medicine | Admitting: Emergency Medicine

## 2015-07-04 ENCOUNTER — Encounter (HOSPITAL_COMMUNITY): Payer: Self-pay

## 2015-07-04 DIAGNOSIS — F17210 Nicotine dependence, cigarettes, uncomplicated: Secondary | ICD-10-CM | POA: Diagnosis not present

## 2015-07-04 DIAGNOSIS — R112 Nausea with vomiting, unspecified: Secondary | ICD-10-CM | POA: Diagnosis not present

## 2015-07-04 MED ORDER — SODIUM CHLORIDE 0.9 % IV BOLUS (SEPSIS)
1000.0000 mL | Freq: Once | INTRAVENOUS | Status: AC
  Administered 2015-07-05: 1000 mL via INTRAVENOUS

## 2015-07-04 MED ORDER — ONDANSETRON HCL 4 MG/2ML IJ SOLN
8.0000 mg | Freq: Once | INTRAMUSCULAR | Status: AC
  Administered 2015-07-05: 8 mg via INTRAVENOUS
  Filled 2015-07-04: qty 4

## 2015-07-04 NOTE — ED Provider Notes (Signed)
TIME SEEN: 12:35 AM  CHIEF COMPLAINT: Nausea, vomiting   HPI:  HPI Comments: Chelsea May is a 30 y.o. female, who is 4.[redacted] weeks gestation by LMP, presents to the Emergency Department complaining of constant vomiting that has been present for 3 days, but persistent over the past 10 hours, unrelieved by phenergan suppositories used at home. She associates diarrhea and abdominal pain secondary to vomiting. The pt was seen in this ED 1 day ago for the same c/o persistent vomiting when she was prescribed  phenergan suppositories for treatment of Hyperemesis. She has not followed up with her OB GYN following this ED visit yesterday but plans to follow up with Dr. Emelda Fear. She denise fevers, chills, dysuria, hematuria, abnormal vaginal bleeding or discharge, or PShx to abdomen. OB history is G: 3 P:2. Patient does have a history of substance abuse. She is requesting pain medication repeatedly.  ROS: See HPI Constitutional: no fever  Eyes: no drainage  ENT: no runny nose   Cardiovascular:  no chest pain  Resp: no SOB  GI: no vomiting GU: no dysuria Integumentary: no rash  Allergy: no hives  Musculoskeletal: no leg swelling  Neurological: no slurred speech ROS otherwise negative  PAST MEDICAL HISTORY/PAST SURGICAL HISTORY:  Past Medical History  Diagnosis Date  . Substance abuse     opiates, detox 05/2011  . Heart murmur   . Hx of nausea and vomiting   . Rhabdomyolysis   . Myonecrosis   . Hematuria     MEDICATIONS:  Prior to Admission medications   Medication Sig Start Date End Date Taking? Authorizing Provider  ibuprofen (ADVIL,MOTRIN) 800 MG tablet Take 1 tablet (800 mg total) by mouth every 8 (eight) hours as needed for mild pain. Patient not taking: Reported on 07/03/2015 05/01/15   Layla Maw Kenyata Napier, DO  ondansetron (ZOFRAN ODT) 4 MG disintegrating tablet  ODT q4 hours prn nausea/vomit 07/03/15   Bethann Berkshire, MD  promethazine (PHENERGAN) 25 MG suppository One every 4 hours  for vomiting 07/03/15   Bethann Berkshire, MD    ALLERGIES:  Allergies  Allergen Reactions  . Hydrocodone Itching and Nausea And Vomiting    Patient states that she gets a metal taste in her mouth.  . Tomato Other (See Comments)    Too acidic, gives patient sores in mouth     SOCIAL HISTORY:  Social History  Substance Use Topics  . Smoking status: Current Every Day Smoker -- 1.00 packs/day for 10 years    Types: Cigarettes  . Smokeless tobacco: Never Used  . Alcohol Use: No     Comment: 3 bottles of wine    FAMILY HISTORY: Family History  Problem Relation Age of Onset  . Muscular dystrophy Other   . Lupus Other   . Heart disease Other     EXAM: BP 122/78 mmHg  Pulse 97  Resp 26  SpO2 99%  LMP 01/22/2015 (Exact Date) CONSTITUTIONAL: Alert and oriented and responds appropriately to questions. Chronically ill-appearing, appears uncomfortable HEAD: Normocephalic EYES: Conjunctivae clear, PERRL ENT: normal nose; no rhinorrhea; dry mucous membranes NECK: Supple, no meningismus, no LAD  CARD: RRR; S1 and S2 appreciated; no murmurs, no clicks, no rubs, no gallops RESP: Normal chest excursion without splinting or tachypnea; breath sounds clear and equal bilaterally; no wheezes, no rhonchi, no rales, no hypoxia or respiratory distress, speaking full sentences ABD/GI: Normal bowel sounds; non-distended; soft,  minimally tender to palpation diffusely, no rebound, no guarding, no peritoneal signs BACK:  The back  appears normal and is non-tender to palpation, there is no CVA tenderness EXT: Normal ROM in all joints; non-tender to palpation; no edema; normal capillary refill; no cyanosis, no calf tenderness or swelling    SKIN: Normal color for age and race; warm; no rash NEURO: Moves all extremities equally, sensation to light touch intact diffusely, cranial nerves II through XII intact PSYCH: The patient's mood and manner are appropriate. Grooming and personal hygiene are  appropriate.  MEDICAL DECISION MAKING: Patient here with hyperemesis gravidarum. She does appear dry on exam but is hemodynamically stable. Reports abdominal soreness from vomiting. She is tender to palpation diffusely with otherwise benign exam. Doubt appendicitis, colitis, diverticulitis, bowel obstruction. Labs yesterday showed a bicarbonate of 18 but otherwise unremarkable. Urine did show large ketones yesterday. We'll give IV fluids, Zofran and reassess.  ED PROGRESS: Patient reports no improvement with Zofran. She is requesting Phenergan. She is also requesting something for her abdominal pain. Given she is pregnant I do not comfortable giving her narcotics have discussed this with patient. Will give her Tylenol.    Patient has refused Tylenol. Reports she is still vomiting. She has been given Zofran and Phenergan. Will give dose of IV Reglan.    Patient stating now that she wants to leave AGAINST MEDICAL ADVICE. States that nothing is helping her and she wants to leave. Nursing staff has explained to her that we are concerned about her and wanted to the hospital given that she was continually vomiting. I suspect there may be some component of drug-seeking behavior given she has repeatedly asked for pain medication and has history of substance abuse. Given her history of opiate abuse and the fact that she is pregnant I did not for comfortable providing her with narcotics during this emergency department stay. She was offered Tylenol which she refused. Her abdominal exam was benign. I feel she has complained capacity to make this decision for herself. She understands risks. We'll have her leave AGAINST MEDICAL ADVICE. Discussed return precautions.      I personally performed the services described in this documentation, which was scribed in my presence. The recorded information has been reviewed and is accurate.    Layla MawKristen N Danese Dorsainvil, DO 07/05/15 [phone]

## 2015-07-04 NOTE — ED Notes (Signed)
I was here yesterday, I am 4.[redacted] weeks pregnant, still vomiting per pt. I am not able to keep anything down at all per pt.  Have not seen OBGYN per pt.  They gave me phenergan supp. Without relief.  With last pregnancy I was in the hospital for 21 days.

## 2015-07-05 LAB — I-STAT CHEM 8, ED
BUN: 9 mg/dL (ref 6–20)
CREATININE: 0.5 mg/dL (ref 0.44–1.00)
Calcium, Ion: 1.13 mmol/L (ref 1.12–1.23)
Chloride: 105 mmol/L (ref 101–111)
GLUCOSE: 111 mg/dL — AB (ref 65–99)
HEMATOCRIT: 39 % (ref 36.0–46.0)
HEMOGLOBIN: 13.3 g/dL (ref 12.0–15.0)
Potassium: 3.5 mmol/L (ref 3.5–5.1)
Sodium: 141 mmol/L (ref 135–145)
TCO2: 20 mmol/L (ref 0–100)

## 2015-07-05 MED ORDER — METOCLOPRAMIDE HCL 5 MG/ML IJ SOLN
10.0000 mg | Freq: Once | INTRAMUSCULAR | Status: AC
  Administered 2015-07-05: 10 mg via INTRAVENOUS
  Filled 2015-07-05: qty 2

## 2015-07-05 MED ORDER — PROMETHAZINE HCL 25 MG/ML IJ SOLN
25.0000 mg | Freq: Once | INTRAMUSCULAR | Status: AC
Start: 2015-07-05 — End: 2015-07-05
  Administered 2015-07-05: 25 mg via INTRAVENOUS
  Filled 2015-07-05: qty 1

## 2015-07-05 MED ORDER — ACETAMINOPHEN 500 MG PO TABS
1000.0000 mg | ORAL_TABLET | Freq: Once | ORAL | Status: DC
  Filled 2015-07-05: qty 2

## 2015-07-05 MED ORDER — SODIUM CHLORIDE 0.9 % IV SOLN: INTRAVENOUS | Status: DC

## 2015-07-05 NOTE — ED Notes (Addendum)
Called to the room because patient wanted IV removed.  Informed patient that the MD wanted her to be admitted to the hospital.  Patient stated that the nausea medication was not helping her and that she wanted to go home. Explained to the patient that she needed IV fluids and medications while being admitted to the hospital.  That if she went home she would return to the ER tomorrow.  Also informed patient of several effects of continuous vomiting such as dehydration, headaches, decreased urine output, and fainting.  Patient still refuses to stay.  IV fluids stopped. IV D/C'd. Dr. Elesa MassedWard informed. Patient left the ER via wheelchair.

## 2015-07-18 ENCOUNTER — Emergency Department (HOSPITAL_COMMUNITY)
Admission: EM | Admit: 2015-07-18 | Discharge: 2015-07-19 | Disposition: A | Discharge status: Home / Self Care | Payer: Medicaid Other | Attending: Emergency Medicine | Admitting: Emergency Medicine

## 2015-07-18 ENCOUNTER — Encounter (HOSPITAL_COMMUNITY): Payer: Self-pay | Admitting: *Deleted

## 2015-07-18 DIAGNOSIS — R112 Nausea with vomiting, unspecified: Secondary | ICD-10-CM

## 2015-07-18 DIAGNOSIS — O210 Mild hyperemesis gravidarum: Secondary | ICD-10-CM

## 2015-07-18 DIAGNOSIS — Z3A16 16 weeks gestation of pregnancy: Secondary | ICD-10-CM | POA: Insufficient documentation

## 2015-07-18 DIAGNOSIS — Z87891 Personal history of nicotine dependence: Secondary | ICD-10-CM | POA: Diagnosis not present

## 2015-07-18 DIAGNOSIS — E876 Hypokalemia: Secondary | ICD-10-CM

## 2015-07-18 LAB — CBC WITH DIFFERENTIAL/PLATELET
Basophils Absolute: 0 10*3/uL (ref 0.0–0.1)
Basophils Relative: 0 %
EOS ABS: 0 10*3/uL (ref 0.0–0.7)
Eosinophils Relative: 0 %
HEMATOCRIT: 41.8 % (ref 36.0–46.0)
HEMOGLOBIN: 14.6 g/dL (ref 12.0–15.0)
LYMPHS ABS: 2.5 10*3/uL (ref 0.7–4.0)
LYMPHS PCT: 16 %
MCH: 31.9 pg (ref 26.0–34.0)
MCHC: 34.9 g/dL (ref 30.0–36.0)
MCV: 91.3 fL (ref 78.0–100.0)
MONOS PCT: 10 %
Monocytes Absolute: 1.6 10*3/uL — ABNORMAL HIGH (ref 0.1–1.0)
NEUTROS ABS: 12.1 10*3/uL — AB (ref 1.7–7.7)
NEUTROS PCT: 74 %
Platelets: 268 10*3/uL (ref 150–400)
RBC: 4.58 MIL/uL (ref 3.87–5.11)
RDW: 12.6 % (ref 11.5–15.5)
WBC: 16.2 10*3/uL — AB (ref 4.0–10.5)

## 2015-07-18 LAB — COMPREHENSIVE METABOLIC PANEL
ALBUMIN: 5.1 g/dL — AB (ref 3.5–5.0)
ALK PHOS: 45 U/L (ref 38–126)
ALT: 20 U/L (ref 14–54)
AST: 26 U/L (ref 15–41)
Anion gap: 13 (ref 5–15)
BILIRUBIN TOTAL: 1.1 mg/dL (ref 0.3–1.2)
BUN: 30 mg/dL — AB (ref 6–20)
CO2: 27 mmol/L (ref 22–32)
CREATININE: 0.63 mg/dL (ref 0.44–1.00)
Calcium: 9 mg/dL (ref 8.9–10.3)
Chloride: 94 mmol/L — ABNORMAL LOW (ref 101–111)
GFR calc Af Amer: >60 mL/min (ref >60)
GLUCOSE: 116 mg/dL — AB (ref 65–99)
Potassium: 2.5 mmol/L — CL (ref 3.5–5.1)
Sodium: 134 mmol/L — ABNORMAL LOW (ref 135–145)
TOTAL PROTEIN: 8.4 g/dL — AB (ref 6.5–8.1)

## 2015-07-18 LAB — HCG, QUANTITATIVE, PREGNANCY: hCG, Beta Chain, Quant, S: 113178 m[IU]/mL — ABNORMAL HIGH (ref <5)

## 2015-07-18 LAB — MAGNESIUM: Magnesium: 2.4 mg/dL (ref 1.7–2.4)

## 2015-07-18 LAB — HCG, SERUM, QUALITATIVE: Preg, Serum: POSITIVE — AB

## 2015-07-18 LAB — LIPASE, BLOOD: LIPASE: 27 U/L (ref 11–51)

## 2015-07-18 MED ORDER — ACETAMINOPHEN 500 MG PO TABS
1000.0000 mg | ORAL_TABLET | Freq: Once | ORAL | Status: DC
  Filled 2015-07-18: qty 2

## 2015-07-18 MED ORDER — ONDANSETRON HCL 4 MG/2ML IJ SOLN
4.0000 mg | Freq: Once | INTRAMUSCULAR | Status: AC
  Administered 2015-07-18: 4 mg via INTRAVENOUS
  Filled 2015-07-18: qty 2

## 2015-07-18 MED ORDER — PROCHLORPERAZINE EDISYLATE 5 MG/ML IJ SOLN: 10.0000 mg | Freq: Once | INTRAMUSCULAR | Status: DC

## 2015-07-18 MED ORDER — SODIUM CHLORIDE 0.9 % IV BOLUS (SEPSIS)
1000.0000 mL | Freq: Once | INTRAVENOUS | Status: AC
  Administered 2015-07-18: 1000 mL via INTRAVENOUS

## 2015-07-18 MED ORDER — METOCLOPRAMIDE HCL 5 MG/ML IJ SOLN
10.0000 mg | Freq: Once | INTRAMUSCULAR | Status: AC
  Administered 2015-07-18: 10 mg via INTRAVENOUS
  Filled 2015-07-18: qty 2

## 2015-07-18 MED ORDER — POTASSIUM CHLORIDE 10 MEQ/100ML IV SOLN
10.0000 meq | INTRAVENOUS | Status: AC
  Administered 2015-07-18 (×2): 10 meq via INTRAVENOUS
  Filled 2015-07-18 (×2): qty 100

## 2015-07-18 NOTE — ED Notes (Signed)
Pt c/o continued n/v since march 24.

## 2015-07-18 NOTE — ED Notes (Signed)
Rn asked pt if she had gotten her prescription for phenergan suppositories filled, pt reports that she had but has not been able to hold them down, RN explained to pt that the prescription was for a suppository that they go in your rectum, pt states "oh yea" advises that she last used one a day or two ago and that it only helped for about two hours,

## 2015-07-18 NOTE — ED Notes (Signed)
Received report on pt, pt states that she is feeling better, update given,  

## 2015-07-18 NOTE — ED Provider Notes (Signed)
CSN: [id number]     Arrival date & time 07/18/15  2045 History  By signing my name below, I, Marisue HumbleMichelle Chaffee, attest that this documentation has been prepared under the direction and in the presence of Lavera Guiseana Duo Makynzi Eastland, MD . Electronically Signed: Marisue HumbleMichelle Chaffee, Scribe. 07/18/2015. 9:50 PM.   Chief Complaint  Patient presents with  . Emesis   The history is provided by the patient. No language interpreter was used.   HPI Comments:  Chelsea May is a 30 y.o. female with PMHx of polysubstance abuse and at [redacted] weeks GA  who presents to the Emergency Department complaining of constant nausea and persistent vomiting for ~2 weeks. Pt reports associated weakness, upper abdominal wall pain, suprapubic abdominal cramping, diarrhea, sore throat secondary to vomiting, and decreased urination.  Pt states morphine, ativan, and phenergan have alleviated her nausea in the past. She is ~[redacted] wks pregnant; she has had persistent issues with nausea and vomiting throughout this pregnancy. Pt was seen in ER for similar symptoms 3/24 and left AMA. Pt denies sick contacts, dysuria, frequency, fever, vaginal discharge, or vaginal bleeding.  Past Medical History  Diagnosis Date  . Substance abuse     opiates, detox 05/2011  . Heart murmur   . Hx of nausea and vomiting   . Rhabdomyolysis   . Myonecrosis   . Hematuria    Past Surgical History  Procedure Laterality Date  . Back surgery      "tumor" removed from her back   Family History  Problem Relation Age of Onset  . Muscular dystrophy Other   . Lupus Other   . Heart disease Other    Social History  Substance Use Topics  . Smoking status: Former Smoker -- 1.00 packs/day for 10 years    Types: Cigarettes  . Smokeless tobacco: Never Used  . Alcohol Use: No     Comment: 3 bottles of wine   OB History    Gravida Para Term Preterm AB TAB SAB Ectopic Multiple Living   3 2 2       2      Review of Systems  Constitutional: Negative for fever.  HENT:  Positive for sore throat.   Gastrointestinal: Positive for nausea, vomiting, abdominal pain and diarrhea.  Genitourinary: Positive for decreased urine volume. Negative for dysuria, frequency, vaginal bleeding and vaginal discharge.  Neurological: Positive for weakness.  All other systems reviewed and are negative.   Allergies  Hydrocodone and Tomato  Home Medications   Prior to Admission medications   Medication Sig Start Date End Date Taking? Authorizing Provider  metoCLOPramide (REGLAN) 10 MG tablet Take 1 tablet (10 mg total) by mouth every 6 (six) hours as needed for nausea or vomiting. 07/19/15   Lavera Guiseana Duo Oswald Pott, MD  ondansetron (ZOFRAN ODT) 4 MG disintegrating tablet 4mg  ODT q4 hours prn nausea/vomit Patient not taking: Reported on 07/18/2015 07/03/15   Bethann BerkshireJoseph Zammit, MD  potassium chloride 20 MEQ/15ML (10%) SOLN Take 15 mLs (20 mEq total) by mouth daily. 07/19/15   Lavera Guiseana Duo Keylin Podolsky, MD  promethazine (PHENERGAN) 25 MG suppository One every 4 hours for vomiting Patient not taking: Reported on 07/18/2015 07/03/15   Bethann BerkshireJoseph Zammit, MD   BP 138/57 mmHg  Pulse 81  Temp(Src) 98.2 F (36.8 C) (Oral)  Resp 18  Ht 5\' 4"  (1.626 m)  Wt 125 lb (56.7 kg)  BMI 21.45 kg/m2  SpO2 100%  LMP 05/03/2015 Physical Exam Physical Exam  Nursing note and vitals reviewed. Constitutional: Well  developed, well nourished, non-toxic, and in no acute distress Head: Normocephalic and atraumatic.  Mouth/Throat: Oropharynx is clear. Dry mucous membranes.  Neck: Normal range of motion. Neck supple.  Cardiovascular: Normal rate and regular rhythm.   Pulmonary/Chest: Effort normal and breath sounds normal.  Abdominal: Soft. Diffusely tender. There is no rebound and no guarding.  Musculoskeletal: Normal range of motion.  Neurological: Alert, no facial droop, fluent speech, moves all extremities symmetrically Skin: Skin is warm and dry.  Psychiatric: Cooperative  ED Course  Procedures  DIAGNOSTIC STUDIES:  Oxygen  Saturation is 100% on RA, normal by my interpretation.    COORDINATION OF CARE:  9:35 PM Will order CBC, CMP, and lipase. Will administer fluids and compazine. Discussed treatment plan with pt at bedside and pt agreed to plan.  Labs Review Labs Reviewed  HCG, SERUM, QUALITATIVE - Abnormal; Notable for the following:    Preg, Serum POSITIVE (*)    All other components within normal limits  CBC WITH DIFFERENTIAL/PLATELET - Abnormal; Notable for the following:    WBC 16.2 (*)    Neutro Abs 12.1 (*)    Monocytes Absolute 1.6 (*)    All other components within normal limits  COMPREHENSIVE METABOLIC PANEL - Abnormal; Notable for the following:    Sodium 134 (*)    Potassium 2.5 (*)    Chloride 94 (*)    Glucose, Bld 116 (*)    BUN 30 (*)    Total Protein 8.4 (*)    Albumin 5.1 (*)    All other components within normal limits  HCG, QUANTITATIVE, PREGNANCY - Abnormal; Notable for the following:    hCG, Beta Chain, Quant, [place] [id number] (*)    All other components within normal limits  LIPASE, BLOOD  MAGNESIUM    Imaging Review No results found. I have personally reviewed and evaluated these images and lab results as part of my medical decision-making.   EKG Interpretation   Date/Time:  Thursday July 18 2015 22:52:00 EDT Ventricular Rate:  78 PR Interval:  152 QRS Duration: 94 QT Interval:  426 QTC Calculation: 485 R Axis:   -109 Text Interpretation:  Sinus rhythm Right superior axis Borderline T wave  abnormalities Borderline prolonged QT interval ED PHYSICIAN INTERPRETATION  AVAILABLE IN [facility] Confirmed by TEST, Record ([id number]) on 07/19/2015  7:34:08 AM      MDM   Final diagnoses:  Non-intractable vomiting with nausea, vomiting of unspecified type  Hypokalemia  Hyperemesis gravidarum    30 year old female [redacted] weeks GA who presents with recurrent N/V. Appears dry on presentation but with normal vital signs. Abdomen soft and benign. [place] previously with gestational  sac and yolk sac, and ruled out for ectopic. No vaginal bleeding per history. Blood work c/f hypokalemia 2.5. Borderline QTc prolongation. Mg normal. Repeleted with 20 mEQ IV potassium and 80 mEQ oral potassium. Symptoms improved after IVF, reglan and zofran and she is requesting discharge home. Discussed hypokalemia and she will be started on supplementation for home. She will see PCP in 2-3 days for repeat potassium.Strict return and follow-up instructions reviewed. She expressed understanding of all discharge instructions and felt comfortable with the plan of care.   I personally performed the services described in this documentation, which was scribed in my presence. The recorded information has been reviewed and is accurate.    Lavera Guise, MD 07/19/15 [phone]

## 2015-07-18 NOTE — ED Notes (Signed)
Pt requesting additional nausea medication, Dr Verdie MosherLiu notified, additional orders given,

## 2015-07-18 NOTE — ED Notes (Signed)
Critical Lab Value : Potassium 2.5  MD aware

## 2015-07-19 ENCOUNTER — Inpatient Hospital Stay (HOSPITAL_COMMUNITY): Admit: 2015-07-19 | Payer: Self-pay

## 2015-07-19 ENCOUNTER — Other Ambulatory Visit (HOSPITAL_COMMUNITY): Payer: Self-pay | Admitting: Emergency Medicine

## 2015-07-19 DIAGNOSIS — R102 Pelvic and perineal pain: Principal | ICD-10-CM

## 2015-07-19 DIAGNOSIS — O26899 Other specified pregnancy related conditions, unspecified trimester: Secondary | ICD-10-CM

## 2015-07-19 MED ORDER — METOCLOPRAMIDE HCL 10 MG PO TABS: 10.0000 mg | ORAL_TABLET | Freq: Four times a day (QID) | ORAL | Status: AC | PRN

## 2015-07-19 MED ORDER — POTASSIUM CHLORIDE CRYS ER 20 MEQ PO TBCR
80.0000 meq | EXTENDED_RELEASE_TABLET | Freq: Once | ORAL | Status: DC
  Filled 2015-07-19: qty 4

## 2015-07-19 MED ORDER — POTASSIUM CHLORIDE 20 MEQ PO PACK
80.0000 meq | PACK | Freq: Once | ORAL | Status: DC
  Filled 2015-07-19: qty 4

## 2015-07-19 MED ORDER — POTASSIUM CHLORIDE 20 MEQ/15ML (10%) PO SOLN: 20.0000 meq | Freq: Every day | ORAL | Status: DC

## 2015-07-19 NOTE — Discharge Instructions (Signed)
Please take daily supplementation of your potassium, and please have your potassium rechecked in 2-3 days by a primary care provider or your [facility] physician. You're also ordered an ultrasound for tomorrow to confirm an intra-uterine pregnancy. Please return for worsening symptoms including worsening pain, vomiting unable to keep down food or fluids, or any other symptoms concerning to you.  Hypokalemia Hypokalemia means that the amount of potassium in the blood is lower than normal.Potassium is a chemical, called an electrolyte, that helps regulate the amount of fluid in the body. It also stimulates muscle contraction and helps nerves function properly.Most of the body's potassium is inside of cells, and only a very small amount is in the blood. Because the amount in the blood is so small, minor changes can be life-threatening. CAUSES  Antibiotics.  Diarrhea or vomiting.  Using laxatives too much, which can cause diarrhea.  Chronic kidney disease.  Water pills (diuretics).  Eating disorders (bulimia).  Low magnesium level.  Sweating a lot. SIGNS AND SYMPTOMS  Weakness.  Constipation.  Fatigue.  Muscle cramps.  Mental confusion.  Skipped heartbeats or irregular heartbeat (palpitations).  Tingling or numbness. DIAGNOSIS  Your health care provider can diagnose hypokalemia with blood tests. In addition to checking your potassium level, your health care provider may also check other lab tests. TREATMENT Hypokalemia can be treated with potassium supplements taken by mouth or adjustments in your current medicines. If your potassium level is very low, you may need to get potassium through a vein (IV) and be monitored in the hospital. A diet high in potassium is also helpful. Foods high in potassium are:  Nuts, such as peanuts and pistachios.  Seeds, such as sunflower seeds and pumpkin seeds.  Peas, lentils, and lima beans.  Whole grain and bran cereals and breads.  Fresh fruit  and vegetables, such as apricots, avocado, bananas, cantaloupe, kiwi, oranges, tomatoes, asparagus, and potatoes.  Orange and tomato juices.  Red meats.  Fruit yogurt. HOME CARE INSTRUCTIONS  Take all medicines as prescribed by your health care provider.  Maintain a healthy diet by including nutritious food, such as fruits, vegetables, nuts, whole grains, and lean meats.  If you are taking a laxative, be sure to follow the directions on the label. SEEK MEDICAL CARE IF:  Your weakness gets worse.  You feel your heart pounding or racing.  You are vomiting or having diarrhea.  You are diabetic and having trouble keeping your blood glucose in the normal range. SEEK IMMEDIATE MEDICAL CARE IF:  You have chest pain, shortness of breath, or dizziness.  You are vomiting or having diarrhea for more than 2 days.  You faint. MAKE SURE YOU:   Understand these instructions.  Will watch your condition.  Will get help right away if you are not doing well or get worse.   This information is not intended to replace advice given to you by your health care provider. Make sure you discuss any questions you have with your health care provider.   Document Released: 03/30/2005 Document Revised: 04/20/2014 Document Reviewed: 09/30/2012 Elsevier Interactive Patient Education 2016 Elsevier Inc.  Hyperemesis Gravidarum Hyperemesis gravidarum is a severe form of nausea and vomiting that happens during pregnancy. Hyperemesis is worse than morning sickness. It may cause you to have nausea or vomiting all day for many days. It may keep you from eating and drinking enough food and liquids. Hyperemesis usually occurs during the first half (the first 20 weeks) of pregnancy. It often goes away once a  woman is in her second half of pregnancy. However, sometimes hyperemesis continues through an entire pregnancy.  CAUSES  The cause of this condition is not completely known but is thought to be related to  changes in the body's hormones when pregnant. It could be from the high level of the pregnancy hormone or an increase in estrogen in the body.  SIGNS AND SYMPTOMS   Severe nausea and vomiting.  Nausea that does not go away.  Vomiting that does not allow you to keep any food down.  Weight loss and body fluid loss (dehydration).  Having no desire to eat or not liking food you have previously enjoyed. DIAGNOSIS  Your health care provider will do a physical exam and ask you about your symptoms. He or she may also order blood tests and urine tests to make sure something else is not causing the problem.  TREATMENT  You may only need medicine to control the problem. If medicines do not control the nausea and vomiting, you will be treated in the hospital to prevent dehydration, increased acid in the blood (acidosis), weight loss, and changes in the electrolytes in your body that may harm the unborn baby (fetus). You may need IV fluids.  HOME CARE INSTRUCTIONS   Only take over-the-counter or prescription medicines as directed by your health care provider.  Try eating a couple of dry crackers or toast in the morning before getting out of bed.  Avoid foods and smells that upset your stomach.  Avoid fatty and spicy foods.  Eat 5-6 small meals a day.  Do not drink when eating meals. Drink between meals.  For snacks, eat high-protein foods, such as cheese.  Eat or suck on things that have ginger in them. Ginger helps nausea.  Avoid food preparation. The smell of food can spoil your appetite.  Avoid iron pills and iron in your multivitamins until after 3-4 months of being pregnant. However, consult with your health care provider before stopping any prescribed iron pills. SEEK MEDICAL CARE IF:   Your abdominal pain increases.  You have a severe headache.  You have vision problems.  You are losing weight. SEEK IMMEDIATE MEDICAL CARE IF:   You are unable to keep fluids down.  You  vomit blood.  You have constant nausea and vomiting.  You have excessive weakness.  You have extreme thirst.  You have dizziness or fainting.  You have a fever or persistent symptoms for more than 2-3 days.  You have a fever and your symptoms suddenly get worse. MAKE SURE YOU:   Understand these instructions.  Will watch your condition.  Will get help right away if you are not doing well or get worse.   This information is not intended to replace advice given to you by your health care provider. Make sure you discuss any questions you have with your health care provider.   Document Released: 03/30/2005 Document Revised: 01/18/2013 Document Reviewed: 11/09/2012 Elsevier Interactive Patient Education [employer]2016 Elsevier Inc.

## 2015-07-29 ENCOUNTER — Emergency Department (HOSPITAL_COMMUNITY): Payer: Medicaid Other

## 2015-07-29 ENCOUNTER — Encounter (HOSPITAL_COMMUNITY): Payer: Self-pay | Admitting: Emergency Medicine

## 2015-07-29 ENCOUNTER — Emergency Department (HOSPITAL_COMMUNITY)
Admission: EM | Admit: 2015-07-29 | Discharge: 2015-07-29 | Disposition: A | Discharge status: Home / Self Care | Payer: Medicaid Other | Attending: Emergency Medicine | Admitting: Emergency Medicine

## 2015-07-29 DIAGNOSIS — Z3A08 8 weeks gestation of pregnancy: Secondary | ICD-10-CM | POA: Diagnosis not present

## 2015-07-29 DIAGNOSIS — R102 Pelvic and perineal pain: Secondary | ICD-10-CM | POA: Insufficient documentation

## 2015-07-29 DIAGNOSIS — O26899 Other specified pregnancy related conditions, unspecified trimester: Secondary | ICD-10-CM

## 2015-07-29 DIAGNOSIS — Z87891 Personal history of nicotine dependence: Secondary | ICD-10-CM | POA: Insufficient documentation

## 2015-07-29 DIAGNOSIS — O26891 Other specified pregnancy related conditions, first trimester: Secondary | ICD-10-CM | POA: Diagnosis not present

## 2015-07-29 DIAGNOSIS — R112 Nausea with vomiting, unspecified: Secondary | ICD-10-CM

## 2015-07-29 DIAGNOSIS — O210 Mild hyperemesis gravidarum: Secondary | ICD-10-CM | POA: Diagnosis present

## 2015-07-29 DIAGNOSIS — O034 Incomplete spontaneous abortion without complication: Secondary | ICD-10-CM

## 2015-07-29 LAB — WET PREP, GENITAL
CLUE CELLS WET PREP: NONE SEEN
Sperm: NONE SEEN
Trich, Wet Prep: NONE SEEN
Yeast Wet Prep HPF POC: NONE SEEN

## 2015-07-29 LAB — COMPREHENSIVE METABOLIC PANEL
ALT: 14 U/L (ref 14–54)
ANION GAP: 12 (ref 5–15)
AST: 23 U/L (ref 15–41)
Albumin: 4.7 g/dL (ref 3.5–5.0)
Alkaline Phosphatase: 41 U/L (ref 38–126)
BILIRUBIN TOTAL: 0.7 mg/dL (ref 0.3–1.2)
BUN: 6 mg/dL (ref 6–20)
CO2: 23 mmol/L (ref 22–32)
Calcium: 9.4 mg/dL (ref 8.9–10.3)
Chloride: 104 mmol/L (ref 101–111)
Creatinine, Ser: 0.71 mg/dL (ref 0.44–1.00)
GFR calc Af Amer: >60 mL/min (ref >60)
Glucose, Bld: 165 mg/dL — ABNORMAL HIGH (ref 65–99)
POTASSIUM: 3.4 mmol/L — AB (ref 3.5–5.1)
Sodium: 139 mmol/L (ref 135–145)
TOTAL PROTEIN: 8 g/dL (ref 6.5–8.1)

## 2015-07-29 LAB — URINALYSIS, ROUTINE W REFLEX MICROSCOPIC
Bilirubin Urine: NEGATIVE
GLUCOSE, UA: NEGATIVE mg/dL
Hgb urine dipstick: NEGATIVE
Ketones, ur: >80 mg/dL — AB
LEUKOCYTES UA: NEGATIVE
Nitrite: NEGATIVE
PH: 7.5 (ref 5.0–8.0)
Protein, ur: 100 mg/dL — AB
Specific Gravity, Urine: 1.01 (ref 1.005–1.030)

## 2015-07-29 LAB — URINE MICROSCOPIC-ADD ON: RBC / HPF: NONE SEEN RBC/hpf (ref 0–5)

## 2015-07-29 LAB — CBC WITH DIFFERENTIAL/PLATELET
Basophils Absolute: 0 10*3/uL (ref 0.0–0.1)
Basophils Relative: 0 %
Eosinophils Absolute: 0 10*3/uL (ref 0.0–0.7)
Eosinophils Relative: 0 %
HEMATOCRIT: 41.9 % (ref 36.0–46.0)
Hemoglobin: 14.8 g/dL (ref 12.0–15.0)
LYMPHS PCT: 11 %
Lymphs Abs: 1.3 10*3/uL (ref 0.7–4.0)
MCH: 32 pg (ref 26.0–34.0)
MCHC: 35.3 g/dL (ref 30.0–36.0)
MCV: 90.7 fL (ref 78.0–100.0)
MONO ABS: 0.2 10*3/uL (ref 0.1–1.0)
MONOS PCT: 1 %
NEUTROS ABS: 10.5 10*3/uL — AB (ref 1.7–7.7)
Neutrophils Relative %: 88 %
Platelets: 338 10*3/uL (ref 150–400)
RBC: 4.62 MIL/uL (ref 3.87–5.11)
RDW: 12.8 % (ref 11.5–15.5)
WBC: 11.9 10*3/uL — ABNORMAL HIGH (ref 4.0–10.5)

## 2015-07-29 LAB — HCG, QUANTITATIVE, PREGNANCY: HCG, BETA CHAIN, QUANT, S: 71796 m[IU]/mL — AB (ref <5)

## 2015-07-29 MED ORDER — SODIUM CHLORIDE 0.9 % IV BOLUS (SEPSIS)
1000.0000 mL | Freq: Once | INTRAVENOUS | Status: AC
  Administered 2015-07-29: 1000 mL via INTRAVENOUS

## 2015-07-29 MED ORDER — PROMETHAZINE HCL 25 MG RE SUPP: 25.0000 mg | Freq: Four times a day (QID) | RECTAL | Status: DC | PRN

## 2015-07-29 MED ORDER — METOCLOPRAMIDE HCL 5 MG/ML IJ SOLN
10.0000 mg | Freq: Once | INTRAMUSCULAR | Status: AC
  Administered 2015-07-29: 10 mg via INTRAVENOUS
  Filled 2015-07-29: qty 2

## 2015-07-29 MED ORDER — PROMETHAZINE HCL 25 MG RE SUPP
25.0000 mg | Freq: Four times a day (QID) | RECTAL | Status: DC | PRN
  Administered 2015-07-29: 25 mg via RECTAL
  Filled 2015-07-29: qty 1

## 2015-07-29 MED ORDER — POTASSIUM CHLORIDE CRYS ER 20 MEQ PO TBCR
40.0000 meq | EXTENDED_RELEASE_TABLET | Freq: Once | ORAL | Status: AC
  Administered 2015-07-29: 40 meq via ORAL
  Filled 2015-07-29: qty 2

## 2015-07-29 MED ORDER — PROMETHAZINE HCL 25 MG PO TABS
25.0000 mg | ORAL_TABLET | Freq: Four times a day (QID) | ORAL | Status: DC | PRN
Start: 2015-07-29 — End: 2015-08-18

## 2015-07-29 NOTE — ED Provider Notes (Signed)
CSN: [id number]     Arrival date & time 07/29/15  1359 History   First MD Initiated Contact with Patient 07/29/15 1503     Chief Complaint  Patient presents with  . Emesis     (Consider location/radiation/quality/duration/timing/severity/associated sxs/prior Treatment) HPI Comments: G3 P2 at [redacted] weeks gestation presenting with nausea, vomiting, diarrhea that onset last night.  Associated with upper abdominal pain, subjective chills and diarrhea every time that she throws up.  Denies vaginal bleeding or discharge.  Seen in ED 4/7 with similar symptoms.  Has not yet seen OB.  Taking reglan at home without relief.  Denies dysuria or hematuria.  Denies sick contacts or travel. No lower abdominal pain.  NO CP or SOB. Hx substance abuse in past.  Denies any recently. Last used [facility] before pregnancy  Patient is a 30 y.o. female presenting with vomiting. The history is provided by the patient. The history is limited by the condition of the patient.  Emesis Associated symptoms: abdominal pain, arthralgias, chills, diarrhea and myalgias   Associated symptoms: no headaches     Past Medical History  Diagnosis Date  . Substance abuse     opiates, detox 05/2011  . Heart murmur   . Hx of nausea and vomiting   . Rhabdomyolysis   . Myonecrosis   . Hematuria    Past Surgical History  Procedure Laterality Date  . Back surgery      "tumor" removed from her back   Family History  Problem Relation Age of Onset  . Muscular dystrophy Other   . Lupus Other   . Heart disease Other    Social History  Substance Use Topics  . Smoking status: Former Smoker -- 1.00 packs/day for 10 years    Types: Cigarettes  . Smokeless tobacco: Never Used  . Alcohol Use: No     Comment: 3 bottles of wine   OB History    Gravida Para Term Preterm AB TAB SAB Ectopic Multiple Living   Review of Systems  Constitutional: Positive for chills, activity change, appetite change and fatigue. Negative for  fever.  HENT: Negative for congestion.   Respiratory: Negative for cough, chest tightness and shortness of breath.   Cardiovascular: Negative for chest pain.  Gastrointestinal: Positive for nausea, vomiting, abdominal pain and diarrhea.  Genitourinary: Negative for dysuria, hematuria and vaginal bleeding.  Musculoskeletal: Positive for myalgias, back pain and arthralgias.  Skin: Negative for rash.  Neurological: Positive for weakness. Negative for dizziness and headaches.  A complete 10 system review of systems was obtained and all systems are negative except as noted in the HPI and PMH.      Allergies  Hydrocodone and Tomato  Home Medications   Prior to Admission medications   Medication Sig Start Date End Date Taking? Authorizing Provider  metoCLOPramide (REGLAN) 10 MG tablet Take 1 tablet (10 mg total) by mouth every 6 (six) hours as needed for nausea or vomiting. 07/19/15  Yes Lavera Guise, MD  potassium chloride 20 MEQ/15ML (10%) SOLN Take 15 mLs (20 mEq total) by mouth daily. Patient not taking: Reported on 07/29/2015 07/19/15   Lavera Guise, MD   BP 103/68 mmHg  Pulse 90  Temp(Src) 98.8 F (37.1 C) (Oral)  Resp 18  Ht  (1.626 m)  Wt 125 lb (56.7 kg)  BMI 21.45 kg/m2  SpO2 100%  LMP 05/03/2015 Physical Exam  Constitutional: She  is oriented to person, place, and time. She appears well-developed and well-nourished. No distress.  Uncomfortable, dry mucus membranes  HENT:  Head: Normocephalic and atraumatic.  Mouth/Throat: Oropharynx is clear and moist. No oropharyngeal exudate.  Eyes: Conjunctivae and EOM are normal. Pupils are equal, round, and reactive to light.  Neck: Normal range of motion. Neck supple.  No meningismus.  Cardiovascular: Normal rate, regular rhythm, normal heart sounds and intact distal pulses.   No murmur heard. Pulmonary/Chest: Effort normal and breath sounds normal. No respiratory distress.  Abdominal: Soft. There is tenderness. There is no  rebound and no guarding.  Epigastric tenderness, no lower abdominal tenderness  Genitourinary:  Chaperone present. Normal external genitalia.  No discharge or blood in vault. No CMT, no lateralizing adnexal tenderness.  Musculoskeletal: Normal range of motion. She exhibits no edema or tenderness.  No CVAT  Neurological: She is alert and oriented to person, place, and time. No cranial nerve deficit. She exhibits normal muscle tone. Coordination normal.  No ataxia on finger to nose bilaterally. No pronator drift. 5/5 strength throughout. CN 2-12 intact.Equal grip strength. Sensation intact.   Skin: Skin is warm.  Psychiatric: She has a normal mood and affect. Her behavior is normal.  Nursing note and vitals reviewed.   ED Course  Procedures (including critical care time) Labs Review Labs Reviewed  CBC WITH DIFFERENTIAL/PLATELET - Abnormal; Notable for the following:    WBC 11.9 (*)    Neutro Abs 10.5 (*)    All other components within normal limits  COMPREHENSIVE METABOLIC PANEL - Abnormal; Notable for the following:    Potassium 3.4 (*)    Glucose, Bld 165 (*)    All other components within normal limits  URINALYSIS, ROUTINE W REFLEX MICROSCOPIC (NOT AT [facility])  HCG, QUANTITATIVE, PREGNANCY  ABO/RH  WET PREP  (BD AFFIRM) ([place])  GC/CHLAMYDIA PROBE AMP ([place]) NOT AT [facility]    Imaging Review [place] Ob Comp Less 14 Wks  07/29/2015  CLINICAL DATA:  Positive pregnancy test with pelvic pain and vomiting for several weeks EXAM: OBSTETRIC <14 WK US AND TRANSVAGINAL OB US TECHNIQUE: Both transabdominal and transvaginal ultrasound examinations were performed for complete evaluation of the gestation as well as the maternal uterus, adnexal regions, and pelvic cul-de-sac. Transvaginal technique was performed to assess early pregnancy. COMPARISON:  None. FINDINGS: Intrauterine gestational sac: The previously seen gestational sac is no longer present. Heterogeneous endometrium is noted. This  measures approximately 4.6 cm in greatest dimension and demonstrates the vascularity. Maternal uterus/adnexae: The ovaries are within normal limits. No free fluid is seen. IMPRESSION: No definitive gestational sac is noted. The previously seen sac is no longer present. Enlarged heterogeneous endometrium is noted. This raises suspicion for molar pregnancy or possible vascularized retained products of conception following spontaneous abortion. The latter is felt to be less likely. Correlation with the beta HCG quantitative level is recommended. Electronically Signed   By: Alcide CleverMark  Lukens M.D.   On: 07/29/2015 16:58   [place] Ob Transvaginal  07/29/2015  CLINICAL DATA:  Positive pregnancy test with pelvic pain and vomiting for several weeks EXAM: OBSTETRIC <14 WK US AND TRANSVAGINAL OB US TECHNIQUE: Both transabdominal and transvaginal ultrasound examinations were performed for complete evaluation of the gestation as well as the maternal uterus, adnexal regions, and pelvic cul-de-sac. Transvaginal technique was performed to assess early pregnancy. COMPARISON:  None. FINDINGS: Intrauterine gestational sac: The previously seen gestational sac is no longer present. Heterogeneous endometrium is noted. This measures approximately 4.6 cm  in greatest dimension and demonstrates the vascularity. Maternal uterus/adnexae: The ovaries are within normal limits. No free fluid is seen. IMPRESSION: No definitive gestational sac is noted. The previously seen sac is no longer present. Enlarged heterogeneous endometrium is noted. This raises suspicion for molar pregnancy or possible vascularized retained products of conception following spontaneous abortion. The latter is felt to be less likely. Correlation with the beta HCG quantitative level is recommended. Electronically Signed   By: Alcide Clever M.D.   On: 07/29/2015 16:58   I have personally reviewed and evaluated these images and lab results as part of my medical decision-making.    EKG Interpretation   Date/Time:  Monday July 29 2015 15:35:46 EDT Ventricular Rate:  78 PR Interval:  165 QRS Duration: 92 QT Interval:  381 QTC Calculation: 434 R Axis:   173 Text Interpretation:  Sinus arrhythmia S1,S2,S3 pattern Baseline wander in  lead(s) V5 No significant change was found Confirmed by Manus Gunning  MD,  Zamiah Tollett [phone]) on 07/29/2015 4:06:01 PM      MDM   Final diagnoses:  Pelvic pain affecting pregnancy   Nausea and vomiting in setting of pregnancy. Patient with dry mucous membranes but normal vital signs. Abdomen soft.  Labs show hyperglycemia without DKA.  Hypokalemia is improving.  Patient had ultrasound on March 22 that showed possible intrauterine gestational sac. She has not had an ultrasound since then. Ultrasound today shows no gestational sac and the previously seen gestational sac is no longer present. There is a heterogeneous endometrium.  HCG 71k, was 113k on 4/7 D/w Dr. Despina Hidden of OB.  He agrees this is a failed pregnancy.  He doubts molar pregnancy or retained products.  He recommends treatment for nausea and vomiting and follow-up in the OB office in one week to assure that Quant is down trending.  Blood sugar 102 on recheck. No evidence of DKA.  Anion gap normal.  No hx of DM.  Patient tolerating by mouth without additional vomiting. Abdomen is soft. She is given Phenergan tablets and suppositories. Follow-up with obstetrics for repeat Quant this week. She is informed that her pregnancy is not going to be successful. Return precautions discussed.  BP 101/61 mmHg  Pulse 98  Temp(Src) 98.8 F (37.1 C) (Oral)  Resp 18  Ht  (1.626 m)  Wt 125 lb (56.7 kg)  BMI 21.45 kg/m2  SpO2 100%  LMP 05/03/2015   Glynn Octave, MD 07/30/15 [phone]

## 2015-07-29 NOTE — ED Notes (Signed)
Vomiting started last night, vomiting about every hour.  Denies any pain, symptoms or discomfort.

## 2015-07-29 NOTE — Discharge Instructions (Signed)
Nausea and Vomiting As we discussed, this pregnancy is not going to be successful. Follow-up with the [facility] doctor to have your pregnancy hormone rechecked in 3 days. Return to the ED for worsening pain, fever, vomiting or any other concerns. Nausea is a sick feeling that often comes before throwing up (vomiting). Vomiting is a reflex where stomach contents come out of your mouth. Vomiting can cause severe loss of body fluids (dehydration). Children and elderly adults can become dehydrated quickly, especially if they also have diarrhea. Nausea and vomiting are symptoms of a condition or disease. It is important to find the cause of your symptoms. CAUSES   Direct irritation of the stomach lining. This irritation can result from increased acid production (gastroesophageal reflux disease), infection, food poisoning, taking certain medicines (such as nonsteroidal anti-inflammatory drugs), alcohol use, or tobacco use.  Signals from the brain.These signals could be caused by a headache, heat exposure, an inner ear disturbance, increased pressure in the brain from injury, infection, a tumor, or a concussion, pain, emotional stimulus, or metabolic problems.  An obstruction in the gastrointestinal tract (bowel obstruction).  Illnesses such as diabetes, hepatitis, gallbladder problems, appendicitis, kidney problems, cancer, sepsis, atypical symptoms of a heart attack, or eating disorders.  Medical treatments such as chemotherapy and radiation.  Receiving medicine that makes you sleep (general anesthetic) during surgery. DIAGNOSIS Your caregiver may ask for tests to be done if the problems do not improve after a few days. Tests may also be done if symptoms are severe or if the reason for the nausea and vomiting is not clear. Tests may include:  Urine tests.  Blood tests.  Stool tests.  Cultures (to look for evidence of infection).  X-rays or other imaging studies. Test results can help your caregiver  make decisions about treatment or the need for additional tests. TREATMENT You need to stay well hydrated. Drink frequently but in small amounts.You may wish to drink water, sports drinks, clear broth, or eat frozen ice pops or gelatin dessert to help stay hydrated.When you eat, eating slowly may help prevent nausea.There are also some antinausea medicines that may help prevent nausea. HOME CARE INSTRUCTIONS   Take all medicine as directed by your caregiver.  If you do not have an appetite, do not force yourself to eat. However, you must continue to drink fluids.  If you have an appetite, eat a normal diet unless your caregiver tells you differently.  Eat a variety of complex carbohydrates (rice, wheat, potatoes, bread), lean meats, yogurt, fruits, and vegetables.  Avoid high-fat foods because they are more difficult to digest.  Drink enough water and fluids to keep your urine clear or pale yellow.  If you are dehydrated, ask your caregiver for specific rehydration instructions. Signs of dehydration may include:  Severe thirst.  Dry lips and mouth.  Dizziness.  Dark urine.  Decreasing urine frequency and amount.  Confusion.  Rapid breathing or pulse. SEEK IMMEDIATE MEDICAL CARE IF:   You have blood or brown flecks (like coffee grounds) in your vomit.  You have black or bloody stools.  You have a severe headache or stiff neck.  You are confused.  You have severe abdominal pain.  You have chest pain or trouble breathing.  You do not urinate at least once every 8 hours.  You develop cold or clammy skin.  You continue to vomit for longer than 24 to 48 hours.  You have a fever. MAKE SURE YOU:   Understand these instructions.  Will  watch your condition.  Will get help right away if you are not doing well or get worse.   This information is not intended to replace advice given to you by your health care provider. Make sure you discuss any questions you have  with your health care provider.   Document Released: 03/30/2005 Document Revised: 06/22/2011 Document Reviewed: 08/27/2010 Elsevier Interactive Patient Education [employer]2016 Elsevier Inc.

## 2015-07-30 LAB — GC/CHLAMYDIA PROBE AMP (~~LOC~~) NOT AT ARMC
CHLAMYDIA, DNA PROBE: NEGATIVE
NEISSERIA GONORRHEA: NEGATIVE

## 2015-07-30 LAB — CBG MONITORING, ED: GLUCOSE-CAPILLARY: 102 mg/dL — AB (ref 65–99)

## 2015-08-18 ENCOUNTER — Emergency Department (HOSPITAL_COMMUNITY)
Admission: EM | Admit: 2015-08-18 | Discharge: 2015-08-18 | Disposition: A | Discharge status: Home / Self Care | Payer: Medicaid Other | Attending: Emergency Medicine | Admitting: Emergency Medicine

## 2015-08-18 ENCOUNTER — Encounter (HOSPITAL_COMMUNITY): Payer: Self-pay

## 2015-08-18 DIAGNOSIS — Z87891 Personal history of nicotine dependence: Secondary | ICD-10-CM | POA: Diagnosis not present

## 2015-08-18 DIAGNOSIS — R112 Nausea with vomiting, unspecified: Secondary | ICD-10-CM

## 2015-08-18 LAB — CBC WITH DIFFERENTIAL/PLATELET
BASOS ABS: 0 10*3/uL (ref 0.0–0.1)
BASOS PCT: 0 %
EOS ABS: 0 10*3/uL (ref 0.0–0.7)
EOS PCT: 0 %
HEMATOCRIT: 39.1 % (ref 36.0–46.0)
HEMOGLOBIN: 13.5 g/dL (ref 12.0–15.0)
Lymphocytes Relative: 15 %
Lymphs Abs: 1.8 10*3/uL (ref 0.7–4.0)
MCH: 31.9 pg (ref 26.0–34.0)
MCHC: 34.5 g/dL (ref 30.0–36.0)
MCV: 92.4 fL (ref 78.0–100.0)
Monocytes Absolute: 0.4 10*3/uL (ref 0.1–1.0)
Monocytes Relative: 3 %
Neutro Abs: 9.9 10*3/uL — ABNORMAL HIGH (ref 1.7–7.7)
Neutrophils Relative %: 82 %
Platelets: 248 10*3/uL (ref 150–400)
RBC: 4.23 MIL/uL (ref 3.87–5.11)
RDW: 13.2 % (ref 11.5–15.5)
WBC: 12.1 10*3/uL — AB (ref 4.0–10.5)

## 2015-08-18 LAB — COMPREHENSIVE METABOLIC PANEL
ALK PHOS: 40 U/L (ref 38–126)
ALT: 11 U/L — ABNORMAL LOW (ref 14–54)
ANION GAP: 10 (ref 5–15)
AST: 17 U/L (ref 15–41)
Albumin: 4.8 g/dL (ref 3.5–5.0)
BILIRUBIN TOTAL: 0.6 mg/dL (ref 0.3–1.2)
BUN: 7 mg/dL (ref 6–20)
CALCIUM: 9.3 mg/dL (ref 8.9–10.3)
CO2: 22 mmol/L (ref 22–32)
Chloride: 106 mmol/L (ref 101–111)
Creatinine, Ser: 0.53 mg/dL (ref 0.44–1.00)
GFR calc Af Amer: >60 mL/min (ref >60)
GLUCOSE: 122 mg/dL — AB (ref 65–99)
POTASSIUM: 3.3 mmol/L — AB (ref 3.5–5.1)
Sodium: 138 mmol/L (ref 135–145)
Total Protein: 7.9 g/dL (ref 6.5–8.1)

## 2015-08-18 LAB — LIPASE, BLOOD: LIPASE: 16 U/L (ref 11–51)

## 2015-08-18 LAB — HCG, QUANTITATIVE, PREGNANCY: HCG, BETA CHAIN, QUANT, S: 5021 m[IU]/mL — AB (ref <5)

## 2015-08-18 MED ORDER — PROMETHAZINE HCL 25 MG RE SUPP: 25.0000 mg | Freq: Four times a day (QID) | RECTAL | Status: DC | PRN

## 2015-08-18 MED ORDER — PROMETHAZINE HCL 25 MG RE SUPP
25.0000 mg | Freq: Four times a day (QID) | RECTAL | Status: DC | PRN
  Filled 2015-08-18: qty 1

## 2015-08-18 MED ORDER — POTASSIUM CHLORIDE CRYS ER 20 MEQ PO TBCR
40.0000 meq | EXTENDED_RELEASE_TABLET | Freq: Once | ORAL | Status: DC
Start: 2015-08-18 — End: 2015-08-18
  Filled 2015-08-18: qty 2

## 2015-08-18 MED ORDER — ONDANSETRON HCL 4 MG/2ML IJ SOLN
4.0000 mg | Freq: Once | INTRAMUSCULAR | Status: AC
  Administered 2015-08-18: 4 mg via INTRAVENOUS
  Filled 2015-08-18: qty 2

## 2015-08-18 MED ORDER — SODIUM CHLORIDE 0.9 % IV BOLUS (SEPSIS)
1000.0000 mL | Freq: Once | INTRAVENOUS | Status: AC
  Administered 2015-08-18: 1000 mL via INTRAVENOUS

## 2015-08-18 MED ORDER — METOCLOPRAMIDE HCL 5 MG/ML IJ SOLN
10.0000 mg | Freq: Once | INTRAMUSCULAR | Status: AC
  Administered 2015-08-18: 10 mg via INTRAVENOUS
  Filled 2015-08-18: qty 2

## 2015-08-18 MED ORDER — POTASSIUM CHLORIDE CRYS ER 20 MEQ PO TBCR: 40.0000 meq | EXTENDED_RELEASE_TABLET | Freq: Two times a day (BID) | ORAL | Status: DC

## 2015-08-18 NOTE — ED Notes (Signed)
Warm pack given at patient request.   Patient also states "im throwing up too much to take oral medicine"

## 2015-08-18 NOTE — ED Provider Notes (Signed)
CSN: [id number]     Arrival date & time 08/18/15  1734 History   First MD Initiated Contact with Patient 08/18/15 1737     Chief Complaint  Patient presents with  . Emesis     (Consider location/radiation/quality/duration/timing/severity/associated sxs/prior Treatment) Patient is a 30 y.o. female presenting with vomiting.  Emesis Severity:  Moderate Duration:  2 days Timing:  Intermittent Progression:  Worsening Chronicity:  Recurrent Recent urination:  Normal Relieved by:  Nothing Worsened by:  Nothing tried Ineffective treatments:  None tried Associated symptoms: abdominal pain (when vomiting)   Associated symptoms: no chills, no diarrhea, no fever and no URI     Past Medical History  Diagnosis Date  . Substance abuse     opiates, detox 05/2011  . Heart murmur   . Hx of nausea and vomiting   . Rhabdomyolysis   . Myonecrosis   . Hematuria    Past Surgical History  Procedure Laterality Date  . Back surgery      "tumor" removed from her back   Family History  Problem Relation Age of Onset  . Muscular dystrophy Other   . Lupus Other   . Heart disease Other    Social History  Substance Use Topics  . Smoking status: Former Smoker -- 1.00 packs/day for 10 years    Types: Cigarettes  . Smokeless tobacco: Never Used  . Alcohol Use: No     Comment: 3 bottles of wine   OB History    Gravida Para Term Preterm AB TAB SAB Ectopic Multiple Living   3 2 2       2      Review of Systems  Constitutional: Negative for chills.  Eyes: Negative for pain and itching.  Gastrointestinal: Positive for vomiting and abdominal pain (when vomiting). Negative for diarrhea.  Endocrine: Negative for polydipsia and polyuria.  All other systems reviewed and are negative.     Allergies  Hydrocodone and Tomato  Home Medications   Prior to Admission medications   Medication Sig Start Date End Date Taking? Authorizing Provider  metoCLOPramide (REGLAN) 10 MG tablet Take 1 tablet  (10 mg total) by mouth every 6 (six) hours as needed for nausea or vomiting. 07/19/15   Lavera Guiseana Duo Liu, MD  potassium chloride SA (K-DUR,KLOR-CON) 20 MEQ tablet Take 2 tablets (40 mEq total) by mouth 2 (two) times daily. 08/18/15   Marily MemosJason Yaacov Koziol, MD  promethazine (PHENERGAN) 25 MG suppository Place 1 suppository (25 mg total) rectally every 6 (six) hours as needed for nausea or vomiting. 08/18/15   Marily MemosJason Shaneka Efaw, MD   BP 102/52 mmHg  Pulse 90  Temp(Src) 98.7 F (37.1 C) (Oral)  Resp 18  Ht 5\' 4"  (1.626 m)  Wt 120 lb (54.432 kg)  BMI 20.59 kg/m2  SpO2 100%  LMP 05/03/2015 Physical Exam  Constitutional: She appears well-developed and well-nourished.  HENT:  Head: Normocephalic and atraumatic.  Neck: Normal range of motion.  Cardiovascular: Normal rate and regular rhythm.   Pulmonary/Chest: No stridor. No respiratory distress.  Abdominal: Soft. She exhibits no distension. There is no tenderness. There is no rebound and no guarding.  Neurological: She is alert.  Skin: Skin is warm and dry.  Nursing note and vitals reviewed.   ED Course  Procedures (including critical care time) Labs Review Labs Reviewed  HCG, QUANTITATIVE, PREGNANCY - Abnormal; Notable for the following:    hCG, Beta Chain, Quant, S 5021 (*)    All other components within normal limits  CBC  WITH DIFFERENTIAL/PLATELET - Abnormal; Notable for the following:    WBC 12.1 (*)    Neutro Abs 9.9 (*)    All other components within normal limits  COMPREHENSIVE METABOLIC PANEL - Abnormal; Notable for the following:    Potassium 3.3 (*)    Glucose, Bld 122 (*)    ALT 11 (*)    All other components within normal limits  LIPASE, BLOOD  URINALYSIS, ROUTINE W REFLEX MICROSCOPIC (NOT AT [facility])    Imaging Review No results found. I have personally reviewed and evaluated these images and lab results as part of my medical decision-making.   EKG Interpretation None      MDM   Final diagnoses:  Non-intractable vomiting with  nausea, vomiting of unspecified type    30 yo F w/ emesis. H/o same. No abdominal pain. No diarrhea. Labs unremarkable. One episode of vomiting here. Quant is decreasing appropriately. Will need to follow up with a primary provider however patient can't afford it. Labs ok. No concern for intraabdominal pathology. Refilled antiemetics and can call number to obtain PCP follow up.   New Prescriptions: Discharge Medication List as of 08/18/2015  7:55 PM    START taking these medications   Details  potassium chloride SA (K-DUR,KLOR-CON) 20 MEQ tablet Take 2 tablets (40 mEq total) by mouth 2 (two) times daily., Starting 08/18/2015, Until Discontinued, Print        I have personally and contemperaneously reviewed labs and imaging and used in my decision making as above.   A medical screening exam was performed and I feel the patient has had an appropriate workup for their chief complaint at this time and likelihood of emergent condition existing is low and thus workup can continue on an outpatient basis.. Their vital signs are stable. They have been counseled on decision, discharge, follow up and which symptoms necessitate immediate return to the emergency department.  They verbally stated understanding and agreement with plan and discharged in stable condition.      Marily Memos, MD 08/18/15 2125

## 2015-08-18 NOTE — ED Notes (Signed)
Patient verbalizes understanding of discharge instructions, prescriptions, home care and follow up care. Patient out of department at this time. 

## 2015-08-18 NOTE — ED Notes (Signed)
Patient c/o continued emesis even after antiemetic medications. No emesis noted by this nurse in any of the emesis bags, or trash can.

## 2015-08-18 NOTE — ED Notes (Signed)
Decreased PO intake with [facility]

## 2015-08-20 ENCOUNTER — Emergency Department (HOSPITAL_COMMUNITY): Payer: Medicaid Other

## 2015-08-20 ENCOUNTER — Encounter (HOSPITAL_COMMUNITY): Payer: Self-pay | Admitting: *Deleted

## 2015-08-20 ENCOUNTER — Emergency Department (HOSPITAL_COMMUNITY)
Admission: EM | Admit: 2015-08-20 | Discharge: 2015-08-20 | Disposition: A | Discharge status: Home / Self Care | Payer: Medicaid Other | Attending: Emergency Medicine | Admitting: Emergency Medicine

## 2015-08-20 DIAGNOSIS — R5383 Other fatigue: Secondary | ICD-10-CM | POA: Insufficient documentation

## 2015-08-20 DIAGNOSIS — O210 Mild hyperemesis gravidarum: Secondary | ICD-10-CM | POA: Diagnosis present

## 2015-08-20 DIAGNOSIS — R011 Cardiac murmur, unspecified: Secondary | ICD-10-CM | POA: Insufficient documentation

## 2015-08-20 DIAGNOSIS — O9989 Other specified diseases and conditions complicating pregnancy, childbirth and the puerperium: Secondary | ICD-10-CM | POA: Diagnosis not present

## 2015-08-20 DIAGNOSIS — O289 Unspecified abnormal findings on antenatal screening of mother: Secondary | ICD-10-CM | POA: Insufficient documentation

## 2015-08-20 DIAGNOSIS — O2690 Pregnancy related conditions, unspecified, unspecified trimester: Secondary | ICD-10-CM

## 2015-08-20 DIAGNOSIS — Z87891 Personal history of nicotine dependence: Secondary | ICD-10-CM | POA: Insufficient documentation

## 2015-08-20 DIAGNOSIS — Z3A00 Weeks of gestation of pregnancy not specified: Secondary | ICD-10-CM | POA: Diagnosis not present

## 2015-08-20 DIAGNOSIS — R112 Nausea with vomiting, unspecified: Secondary | ICD-10-CM

## 2015-08-20 DIAGNOSIS — Z8739 Personal history of other diseases of the musculoskeletal system and connective tissue: Secondary | ICD-10-CM | POA: Diagnosis not present

## 2015-08-20 LAB — COMPREHENSIVE METABOLIC PANEL
ALT: 11 U/L — ABNORMAL LOW (ref 14–54)
ANION GAP: 10 (ref 5–15)
AST: 16 U/L (ref 15–41)
Albumin: 5.2 g/dL — ABNORMAL HIGH (ref 3.5–5.0)
Alkaline Phosphatase: 41 U/L (ref 38–126)
BUN: 9 mg/dL (ref 6–20)
CHLORIDE: 105 mmol/L (ref 101–111)
CO2: 25 mmol/L (ref 22–32)
Calcium: 9.4 mg/dL (ref 8.9–10.3)
Creatinine, Ser: 0.46 mg/dL (ref 0.44–1.00)
GFR calc non Af Amer: >60 mL/min (ref >60)
Glucose, Bld: 127 mg/dL — ABNORMAL HIGH (ref 65–99)
POTASSIUM: 3.1 mmol/L — AB (ref 3.5–5.1)
SODIUM: 140 mmol/L (ref 135–145)
Total Bilirubin: 0.9 mg/dL (ref 0.3–1.2)
Total Protein: 8 g/dL (ref 6.5–8.1)

## 2015-08-20 LAB — LIPASE, BLOOD: LIPASE: 20 U/L (ref 11–51)

## 2015-08-20 LAB — URINE MICROSCOPIC-ADD ON

## 2015-08-20 LAB — CBC
HEMATOCRIT: 39.3 % (ref 36.0–46.0)
HEMOGLOBIN: 13.7 g/dL (ref 12.0–15.0)
MCH: 31.9 pg (ref 26.0–34.0)
MCHC: 34.9 g/dL (ref 30.0–36.0)
MCV: 91.6 fL (ref 78.0–100.0)
Platelets: 294 10*3/uL (ref 150–400)
RBC: 4.29 MIL/uL (ref 3.87–5.11)
RDW: 13.5 % (ref 11.5–15.5)
WBC: 10.5 10*3/uL (ref 4.0–10.5)

## 2015-08-20 LAB — URINALYSIS, ROUTINE W REFLEX MICROSCOPIC
Bilirubin Urine: NEGATIVE
GLUCOSE, UA: NEGATIVE mg/dL
HGB URINE DIPSTICK: NEGATIVE
Ketones, ur: 15 mg/dL — AB
Nitrite: NEGATIVE
PH: 7.5 (ref 5.0–8.0)
Protein, ur: NEGATIVE mg/dL
SPECIFIC GRAVITY, URINE: 1.021 (ref 1.005–1.030)

## 2015-08-20 LAB — HCG, QUANTITATIVE, PREGNANCY: HCG, BETA CHAIN, QUANT, S: 4169 m[IU]/mL — AB (ref <5)

## 2015-08-20 MED ORDER — METOCLOPRAMIDE HCL 5 MG/ML IJ SOLN
5.0000 mg | Freq: Once | INTRAMUSCULAR | Status: AC
  Administered 2015-08-20: 5 mg via INTRAVENOUS
  Filled 2015-08-20: qty 2

## 2015-08-20 MED ORDER — LIDOCAINE HCL 1 % IJ SOLN
INTRAMUSCULAR | Status: AC
  Administered 2015-08-20: 0.9 mL
  Filled 2015-08-20: qty 20

## 2015-08-20 MED ORDER — CEFTRIAXONE SODIUM 250 MG IJ SOLR
250.0000 mg | Freq: Once | INTRAMUSCULAR | Status: AC
  Administered 2015-08-20: 250 mg via INTRAMUSCULAR
  Filled 2015-08-20: qty 250

## 2015-08-20 MED ORDER — ONDANSETRON HCL 4 MG/2ML IJ SOLN
4.0000 mg | Freq: Once | INTRAMUSCULAR | Status: AC
  Administered 2015-08-20: 4 mg via INTRAVENOUS
  Filled 2015-08-20: qty 2

## 2015-08-20 MED ORDER — SODIUM CHLORIDE 0.9 % IV BOLUS (SEPSIS)
1000.0000 mL | Freq: Once | INTRAVENOUS | Status: AC
  Administered 2015-08-20: 1000 mL via INTRAVENOUS

## 2015-08-20 MED ORDER — METRONIDAZOLE 500 MG PO TABS
2000.0000 mg | ORAL_TABLET | Freq: Once | ORAL | Status: AC
  Administered 2015-08-20: 2000 mg via ORAL
  Filled 2015-08-20: qty 4

## 2015-08-20 MED ORDER — AZITHROMYCIN 250 MG PO TABS
1000.0000 mg | ORAL_TABLET | Freq: Once | ORAL | Status: AC
  Administered 2015-08-20: 1000 mg via ORAL
  Filled 2015-08-20: qty 4

## 2015-08-20 MED ORDER — PROMETHAZINE HCL 25 MG PO TABS: 25.0000 mg | ORAL_TABLET | Freq: Four times a day (QID) | ORAL | Status: AC | PRN

## 2015-08-20 MED ORDER — ONDANSETRON HCL 4 MG/2ML IJ SOLN: 4.0000 mg | Freq: Once | INTRAMUSCULAR | Status: DC

## 2015-08-20 MED ORDER — PROMETHAZINE HCL 25 MG PO TABS: 25.0000 mg | ORAL_TABLET | Freq: Four times a day (QID) | ORAL | Status: DC | PRN

## 2015-08-20 NOTE — Discharge Instructions (Signed)
You were seen and evaluated today for your continued nausea and vomiting. This is secondary to your abnormal pregnancy. It appears that your miscarriage is likely what Scott a molar pregnancy. I talked to the OB/GYN on call. They are going to reach out to you to arrange for an appointment in the next 1-2 days. Their phone number has been provided so you may also call them directly. You need to make sure you follow-up because they will need to do a procedure to help resolve her symptoms and resolved this abnormal pregnancy.  You were also found to have was called Trichomonas in your urine. This is a sexually transmitted disease. You have been treated for this and other sexually transmitted diseases today. Please inform any sexual partners.  Molar Pregnancy A molar pregnancy (hydatidiform mole) is a mass of tissue that grows in the uterus after conception. The mass is created by an egg that was not fertilized correctly and abnormally grows. It is an abnormal pregnancy and does not develop into a fetus. If a molar pregnancy is suspected by your health care provider, treatment is required. CAUSES  Molar pregnancy is caused by an egg that is fertilized incorrectly so that it has abnormal genetic material (chromosomes). This can result in one of 2 types of molar pregnancy:  Complete molar pregnancy--All of the chromosomes in the fertilized egg come from the father; none come from the mother.  Partial molar pregnancy--The fertilized egg has chromosomes from the father and mother, but it has too many chromosomes. RISK FACTORS  Certain risk factors make a molar pregnancy more likely. They include:   Being over age 27 or under age 39.  History of a molar pregnancy in the past (extremely small chance of recurrence). Other possible risk factors include:   Smoking more than 15 cigarettes per day.  History of infertility.  Having a certain blood type (A, B, AB).  Having a vitamin A deficiency.  Using  oral contraceptives. SIGNS AND SYMPTOMS   Vaginal bleeding.  Missed menstrual period.  Uterus grows quicker than normal.  Severe nausea and vomiting.  Severe pressure or pain in the uterus.  Abnormal ovarian cysts (theca lutein cysts).  Discharge from the vagina that looks like grapes.  High blood pressure (early onset of preeclampsia).  Overactive thyroid (hyperthyroidism).  Anemia. DIAGNOSIS  If your health care provider thinks there is a chance of a molar pregnancy, testing will be recommended. Possible tests include:   An ultrasound test.  Blood tests. TREATMENT  Most molar pregnancies end on their own by miscarriage. However, a health care provider needs to make sure that all the abnormal tissue is out of the womb. This can be done with dilation and curettage (D&C) or suction curettage. In this procedure, any remaining molar tissue is removed through the vagina. After diagnosis of a molar pregnancy, the pregnancy hormone levels must be followed until the level is zero. If the pregnancy hormone level does not drop appropriately, chemotherapy may be necessary. Also, you will be given a medicine called Rho (D) immune globulin if you are Rh negative and your sex partner is Rh positive. This helps prevent Rh problems in future pregnancies. HOME CARE INSTRUCTIONS   Avoid getting pregnant for 6-12 months or as directed by your health care provider. Use a reliable form of birth control or do not have sex.  Only take over-the-counter or prescription medicine as directed by your health care provider.  Keep all follow-up appointments and get all suggested  lab tests and ultrasound tests.  Gradually return to normal activities.  Think about joining a support group. Ask for help if you are struggling with grief.   This information is not intended to replace advice given to you by your health care provider. Make sure you discuss any questions you have with your health care  provider.   Document Released: 12/16/2010 Document Revised: 04/20/2014 Document Reviewed: 10/27/2012 Elsevier Interactive Patient Education 2016 [employer]   Trichomoniasis Trichomoniasis is an infection caused by an organism called Trichomonas. The infection can affect both women and men. In women, the outer female genitalia and the vagina are affected. In men, the penis is mainly affected, but the prostate and other reproductive organs can also be involved. Trichomoniasis is a sexually transmitted infection (STI) and is most often passed to another person through sexual contact.  RISK FACTORS  Having unprotected sexual intercourse.  Having sexual intercourse with an infected partner. SIGNS AND SYMPTOMS  Symptoms of trichomoniasis in women include:  Abnormal gray-green frothy vaginal discharge.  Itching and irritation of the vagina.  Itching and irritation of the area outside the vagina. Symptoms of trichomoniasis in men include:   Penile discharge with or without pain.  Pain during urination. This results from inflammation of the urethra. DIAGNOSIS  Trichomoniasis may be found during a Pap test or physical exam. Your health care provider may use one of the following methods to help diagnose this infection:  Testing the pH of the vagina with a test tape.  Using a vaginal swab test that checks for the Trichomonas organism. A test is available that provides results within a few minutes.  Examining a urine sample.  Testing vaginal secretions. Your health care provider may test you for other STIs, including HIV. TREATMENT   You may be given medicine to fight the infection. Women should inform their health care provider if they could be or are pregnant. Some medicines used to treat the infection should not be taken during pregnancy.  Your health care provider may recommend over-the-counter medicines or creams to decrease itching or irritation.  Your sexual partner will need  to be treated if infected.  Your health care provider may test you for infection again 3 months after treatment. HOME CARE INSTRUCTIONS   Take medicines only as directed by your health care provider.  Take over-the-counter medicine for itching or irritation as directed by your health care provider.  Do not have sexual intercourse while you have the infection.  Women should not douche or wear tampons while they have the infection.  Discuss your infection with your partner. Your partner may have gotten the infection from you, or you may have gotten it from your partner.  Have your sex partner get examined and treated if necessary.  Practice safe, informed, and protected sex.  See your health care provider for other STI testing. SEEK MEDICAL CARE IF:   You still have symptoms after you finish your medicine.  You develop abdominal pain.  You have pain when you urinate.  You have bleeding after sexual intercourse.  You develop a rash.  Your medicine makes you sick or makes you throw up (vomit). MAKE SURE YOU:  Understand these instructions.  Will watch your condition.  Will get help right away if you are not doing well or get worse.   This information is not intended to replace advice given to you by your health care provider. Make sure you discuss any questions you have with your health care  provider.   Document Released: 09/23/2000 Document Revised: 04/20/2014 Document Reviewed: 01/09/2013 Elsevier Interactive Patient Education [employer]2016 Elsevier Inc.

## 2015-08-20 NOTE — ED Provider Notes (Signed)
CSN: [id number]     Arrival date & time 08/20/15  4098 History   First MD Initiated Contact with Patient 08/20/15 1136     Chief Complaint  Patient presents with  . Emesis     (Consider location/radiation/quality/duration/timing/severity/associated sxs/prior Treatment) HPI Comments: 30 y.o. Female G5P2 presents for nausea and vomiting.  The patient was diagnosed with a miscarriage in mid April and seen in the [facility] emergency department where she underwent an [place] that showed an abnormal, unviable pregnancy.  She says that she has not been able to follow up outpatient since that time but reports that initially she had some bleeding but recently has not had any vaginal bleeding.  She says that she has had severe nausea and vomiting over this time and feels that the miscarriage is not happening as it should.  She has been seen multiple times in the ER for this issue over this time.  She denies abdominal pain, diarrhea, constipation, fever, chills.     Past Medical History  Diagnosis Date  . Substance abuse     opiates, detox 05/2011  . Heart murmur   . Hx of nausea and vomiting   . Rhabdomyolysis   . Myonecrosis   . Hematuria    Past Surgical History  Procedure Laterality Date  . Back surgery      "tumor" removed from her back   Family History  Problem Relation Age of Onset  . Muscular dystrophy Other   . Lupus Other   . Heart disease Other    Social History  Substance Use Topics  . Smoking status: Former Smoker -- 1.00 packs/day for 10 years    Types: Cigarettes  . Smokeless tobacco: Never Used  . Alcohol Use: No     Comment: 3 bottles of wine   OB History    Gravida Para Term Preterm AB TAB SAB Ectopic Multiple Living   Review of Systems  Constitutional: Positive for fatigue. Negative for fever and chills.  HENT: Negative for congestion and postnasal drip.   Eyes: Negative for visual disturbance.  Respiratory: Negative for cough, chest tightness  and shortness of breath.   Cardiovascular: Negative for chest pain and palpitations.  Gastrointestinal: Positive for nausea and vomiting. Negative for abdominal pain, diarrhea and constipation.  Genitourinary: Negative for dysuria, urgency, frequency and hematuria.  Musculoskeletal: Negative for myalgias and back pain.  Skin: Negative for rash.  Neurological: Negative for weakness and headaches.  Hematological: Does not bruise/bleed easily.      Allergies  Hydrocodone and Tomato  Home Medications   Prior to Admission medications   Medication Sig Start Date End Date Taking? Authorizing Provider  metoCLOPramide (REGLAN) 10 MG tablet Take 1 tablet (10 mg total) by mouth every 6 (six) hours as needed for nausea or vomiting. Patient not taking: Reported on 08/20/2015 07/19/15   Lavera Guise, MD  potassium chloride SA (K-DUR,KLOR-CON) 20 MEQ tablet Take 2 tablets (40 mEq total) by mouth 2 (two) times daily. Patient not taking: Reported on 08/20/2015 08/18/15   Marily Memos, MD  promethazine (PHENERGAN) 25 MG suppository Place 1 suppository (25 mg total) rectally every 6 (six) hours as needed for nausea or vomiting. Patient not taking: Reported on 08/20/2015 08/18/15   Marily Memos, MD   BP 110/66 mmHg  Pulse 81  Temp(Src) 98.1 F (36.7 C) (Oral)  Resp 16  Ht  (1.626 m)  Wt  120 lb (54.432 kg)  BMI 20.59 kg/m2  SpO2 100%  LMP 05/03/2015  Breastfeeding? Unknown Physical Exam  Constitutional: She is oriented to person, place, and time. She appears well-developed and well-nourished. No distress.  HENT:  Head: Normocephalic and atraumatic.  Right Ear: External ear normal.  Left Ear: External ear normal.  Nose: Nose normal.  Mouth/Throat: Oropharynx is clear and moist. No oropharyngeal exudate.  Eyes: EOM are normal. Pupils are equal, round, and reactive to light.  Neck: Normal range of motion. Neck supple.  Cardiovascular: Normal rate, regular rhythm, normal heart sounds and intact distal  pulses.   No murmur heard. Pulmonary/Chest: Effort normal. No respiratory distress. She has no wheezes. She has no rales.  Abdominal: Soft. She exhibits no distension. There is no tenderness.  Musculoskeletal: Normal range of motion. She exhibits no edema or tenderness.  Neurological: She is alert and oriented to person, place, and time.  Skin: Skin is warm and dry. No rash noted. She is not diaphoretic.  Vitals reviewed.   ED Course  Procedures (including critical care time) Labs Review Labs Reviewed  COMPREHENSIVE METABOLIC PANEL - Abnormal; Notable for the following:    Potassium 3.1 (*)    Glucose, Bld 127 (*)    Albumin 5.2 (*)    ALT 11 (*)    All other components within normal limits  URINALYSIS, ROUTINE W REFLEX MICROSCOPIC (NOT AT [facility]) - Abnormal; Notable for the following:    APPearance CLOUDY (*)    Ketones, ur 15 (*)    Leukocytes, UA SMALL (*)    All other components within normal limits  HCG, QUANTITATIVE, PREGNANCY - Abnormal; Notable for the following:    hCG, Beta Chain, Quant, S 4169 (*)    All other components within normal limits  URINE MICROSCOPIC-ADD ON - Abnormal; Notable for the following:    Squamous Epithelial / LPF 6-30 (*)    Bacteria, UA MANY (*)    All other components within normal limits  LIPASE, BLOOD  CBC  GC/CHLAMYDIA PROBE AMP ([place]) NOT AT [facility]    Imaging Review Koreas Ob Comp Less 14 Wks  08/20/2015  CLINICAL DATA:  Nausea and vomiting. Diagnosed with an incomplete miscarriage on 07/31/2015. Quantitative beta HCG 4,169. EXAM: OBSTETRIC <14 WK ULTRASOUND TECHNIQUE: Transabdominal ultrasound was performed for evaluation of the gestation as well as the maternal uterus and adnexal regions. COMPARISON:  07/29/2015. FINDINGS: Intrauterine gestational sac: Not visualized Yolk sac:  Not visualized Embryo:  Not visualized Maternal uterus/adnexae: The endometrial cavity remains expanded with progressively heterogeneous material within the  endometrial cavity. There is prominent blood flow at the periphery of this heterogeneous material without internal blood flow with color Doppler. Normal appearing maternal ovaries. No free peritoneal fluid. IMPRESSION: Continued expansion of the endometrial cavity with progressively heterogeneous material within the endometrial cavity, containing numerous cystic spaces. 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 Electronically Signed   By: Beckie SaltsSteven  Reid M.D.   On: 08/20/2015 16:34   Koreas Ob Transvaginal  08/20/2015  CLINICAL DATA:  Nausea and vomiting. Diagnosed with an incomplete miscarriage on 07/31/2015. Quantitative beta HCG 4,169. EXAM: OBSTETRIC <14 WK ULTRASOUND TECHNIQUE: Transabdominal ultrasound was performed for evaluation of the gestation as well as the maternal uterus and adnexal regions. COMPARISON:  07/29/2015. FINDINGS: Intrauterine gestational sac: Not visualized Yolk sac:  Not visualized Embryo:  Not visualized Maternal uterus/adnexae: The endometrial cavity remains expanded with progressively heterogeneous material within the endometrial cavity. There is prominent blood flow at  the periphery of this heterogeneous material without internal blood flow with color Doppler. Normal appearing maternal ovaries. No free peritoneal fluid. IMPRESSION: Continued expansion of the endometrial cavity with progressively heterogeneous material within the endometrial cavity, containing numerous cystic spaces. 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 Electronically Signed   By: Beckie Salts M.D.   On: 08/20/2015 16:34   I have personally reviewed and evaluated these images and lab results as part of my medical decision-making.   EKG Interpretation None      MDM  Patient was seen and evaluated in stable condition.  Labs with mild hypokalemia.  UA positive for trich and sent for GC/chlamydia as well.  PAtient treated with rocephin, azithromycin, 2 g flagyl.  Korea again  concerning for molar pregnancy.  Discussed case with Dr. Charyl Bigger from [facility] O[facility]YN who said that she would have the office call the patient to arrange follow up in the office and then to arrange D&E.  This was discussed with the patient who expressed understanding and the seriousness of this issue and the need for close follow up.  She said that her grandfather would be able to get her to the appointments.  She was provided the women's clinic office as well.  She was discharged in stable condition with prescription for phenergan.  Strict return precautions given. Final diagnoses:  Abnormal pregnancy, unspecified trimester    1. 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 Intractable nausea    Leta Baptist, MD 08/21/15 [phone]

## 2015-08-20 NOTE — ED Notes (Signed)
Pt reports n/v/d and abd pain x 3 days.  Pt reports she's been having same problem for 2 years now.  No dx.

## 2015-08-20 NOTE — Progress Notes (Signed)
Patient listed as not having a pcp or insurance living in [place].  EDCM provided patient with list of free medical clinics [place], contact information for the [employer] in [place], and contact information for medication assistance in [place].  Patient thankful for resources.  No further EDCM needs at this time.

## 2015-08-21 ENCOUNTER — Telehealth: Payer: Self-pay | Admitting: General Practice

## 2015-08-21 ENCOUNTER — Telehealth (HOSPITAL_COMMUNITY): Payer: Self-pay | Admitting: Obstetrics & Gynecology

## 2015-08-21 NOTE — Telephone Encounter (Signed)
Patient called and left message stating [facility] us regarding follow up. Patient states she was recently told she has a molar pregnancy and she has also been feeling extremely sick recently.

## 2015-08-21 NOTE — Telephone Encounter (Signed)
Telephone call to patient regarding upcoming surgery.  Patient was not in, no voice mail available.  Have attempted to reach patient x 2.  Will continue to try back.  Surgery is posted for 08/23/15 at 1:40pm.  She will need [facility] at 12 noon and be NPO after midnight on 08/22/15.

## 2015-08-21 NOTE — Telephone Encounter (Signed)
Spoke to Dr Erin FullingHarraway Smith, who is contacting [place] to set patient up for surgery in next couple of days. Patient should anticipate phone call with surgery information today. Called patient and discussed with her. Patient verbalized understanding & had no questions

## 2015-08-22 ENCOUNTER — Encounter (HOSPITAL_COMMUNITY): Payer: Self-pay | Admitting: *Deleted

## 2015-08-23 ENCOUNTER — Ambulatory Visit (HOSPITAL_COMMUNITY): Payer: Medicaid Other | Admitting: Anesthesiology

## 2015-08-23 ENCOUNTER — Encounter: Payer: Self-pay | Admitting: Obstetrics & Gynecology

## 2015-08-23 ENCOUNTER — Encounter (HOSPITAL_COMMUNITY): Payer: Self-pay | Admitting: *Deleted

## 2015-08-23 ENCOUNTER — Ambulatory Visit (HOSPITAL_COMMUNITY)
Admission: RE | Admit: 2015-08-23 | Discharge: 2015-08-23 | Disposition: A | Discharge status: Home / Self Care | Payer: Medicaid Other | Source: Ambulatory Visit | Attending: Obstetrics and Gynecology | Admitting: Obstetrics and Gynecology

## 2015-08-23 ENCOUNTER — Encounter (HOSPITAL_COMMUNITY)
Admission: RE | Disposition: A | Discharge status: Home / Self Care | Payer: Self-pay | Source: Ambulatory Visit | Attending: Obstetrics and Gynecology

## 2015-08-23 DIAGNOSIS — F1210 Cannabis abuse, uncomplicated: Secondary | ICD-10-CM | POA: Insufficient documentation

## 2015-08-23 DIAGNOSIS — F419 Anxiety disorder, unspecified: Secondary | ICD-10-CM | POA: Diagnosis not present

## 2015-08-23 DIAGNOSIS — F17210 Nicotine dependence, cigarettes, uncomplicated: Secondary | ICD-10-CM | POA: Diagnosis not present

## 2015-08-23 DIAGNOSIS — M6282 Rhabdomyolysis: Secondary | ICD-10-CM | POA: Insufficient documentation

## 2015-08-23 DIAGNOSIS — O020 Blighted ovum and nonhydatidiform mole: Secondary | ICD-10-CM | POA: Diagnosis present

## 2015-08-23 HISTORY — DX: Anxiety disorder, unspecified: F41.9

## 2015-08-23 HISTORY — PX: DILATION AND EVACUATION: SHX1459

## 2015-08-23 SURGERY — DILATION AND EVACUATION, UTERUS
Anesthesia: General

## 2015-08-23 MED ORDER — PROPOFOL 10 MG/ML IV BOLUS
INTRAVENOUS | Status: AC
  Filled 2015-08-23: qty 20

## 2015-08-23 MED ORDER — GLYCOPYRROLATE 0.2 MG/ML IJ SOLN
INTRAMUSCULAR | Status: DC | PRN
  Administered 2015-08-23: 0.2 mg via INTRAVENOUS
  Administered 2015-08-23 (×2): 0.1 mg via INTRAVENOUS

## 2015-08-23 MED ORDER — DEXAMETHASONE SODIUM PHOSPHATE 10 MG/ML IJ SOLN
INTRAMUSCULAR | Status: DC | PRN
  Administered 2015-08-23: 4 mg via INTRAVENOUS

## 2015-08-23 MED ORDER — FENTANYL CITRATE (PF) 250 MCG/5ML IJ SOLN
INTRAMUSCULAR | Status: AC
  Filled 2015-08-23: qty 5

## 2015-08-23 MED ORDER — DOXYCYCLINE HYCLATE 100 MG IV SOLR
200.0000 mg | INTRAVENOUS | Status: DC
  Filled 2015-08-23: qty 200

## 2015-08-23 MED ORDER — DOXYCYCLINE HYCLATE 100 MG IV SOLR
200.0000 mg | INTRAVENOUS | Status: AC
  Administered 2015-08-23: 200 mg via INTRAVENOUS
  Filled 2015-08-23: qty 200

## 2015-08-23 MED ORDER — LIDOCAINE HCL (CARDIAC) 20 MG/ML IV SOLN
INTRAVENOUS | Status: AC
  Filled 2015-08-23: qty 5

## 2015-08-23 MED ORDER — DEXAMETHASONE SODIUM PHOSPHATE 4 MG/ML IJ SOLN
INTRAMUSCULAR | Status: AC
  Filled 2015-08-23: qty 1

## 2015-08-23 MED ORDER — METOCLOPRAMIDE HCL 5 MG/ML IJ SOLN
10.0000 mg | Freq: Once | INTRAMUSCULAR | Status: AC | PRN
  Administered 2015-08-23: 10 mg via INTRAVENOUS

## 2015-08-23 MED ORDER — SUCCINYLCHOLINE CHLORIDE 20 MG/ML IJ SOLN
INTRAMUSCULAR | Status: DC | PRN
  Administered 2015-08-23: 80 mg via INTRAVENOUS

## 2015-08-23 MED ORDER — ROCURONIUM BROMIDE 100 MG/10ML IV SOLN
INTRAVENOUS | Status: AC
  Filled 2015-08-23: qty 1

## 2015-08-23 MED ORDER — FENTANYL CITRATE (PF) 100 MCG/2ML IJ SOLN
INTRAMUSCULAR | Status: AC
  Filled 2015-08-23: qty 2

## 2015-08-23 MED ORDER — MIDAZOLAM HCL 2 MG/2ML IJ SOLN
INTRAMUSCULAR | Status: DC | PRN
  Administered 2015-08-23: 2 mg via INTRAVENOUS

## 2015-08-23 MED ORDER — FENTANYL CITRATE (PF) 100 MCG/2ML IJ SOLN
INTRAMUSCULAR | Status: DC | PRN
  Administered 2015-08-23: 100 ug via INTRAVENOUS

## 2015-08-23 MED ORDER — HYDROMORPHONE HCL 1 MG/ML IJ SOLN
INTRAMUSCULAR | Status: AC
  Filled 2015-08-23: qty 1

## 2015-08-23 MED ORDER — KETOROLAC TROMETHAMINE 30 MG/ML IJ SOLN
INTRAMUSCULAR | Status: AC
  Filled 2015-08-23: qty 1

## 2015-08-23 MED ORDER — FENTANYL CITRATE (PF) 100 MCG/2ML IJ SOLN
25.0000 ug | INTRAMUSCULAR | Status: DC | PRN
  Administered 2015-08-23 (×2): 50 ug via INTRAVENOUS

## 2015-08-23 MED ORDER — ONDANSETRON HCL 4 MG/2ML IJ SOLN
INTRAMUSCULAR | Status: AC
  Filled 2015-08-23: qty 2

## 2015-08-23 MED ORDER — SCOPOLAMINE 1 MG/3DAYS TD PT72: 1.0000 | MEDICATED_PATCH | Freq: Once | TRANSDERMAL | Status: DC

## 2015-08-23 MED ORDER — MIDAZOLAM HCL 2 MG/2ML IJ SOLN
INTRAMUSCULAR | Status: AC
  Filled 2015-08-23: qty 2

## 2015-08-23 MED ORDER — KETOROLAC TROMETHAMINE 30 MG/ML IJ SOLN
INTRAMUSCULAR | Status: DC | PRN
  Administered 2015-08-23: 30 mg via INTRAVENOUS

## 2015-08-23 MED ORDER — EPHEDRINE 5 MG/ML INJ
INTRAVENOUS | Status: AC
  Filled 2015-08-23: qty 10

## 2015-08-23 MED ORDER — PHENYLEPHRINE 40 MCG/ML (10ML) SYRINGE FOR IV PUSH (FOR BLOOD PRESSURE SUPPORT)
PREFILLED_SYRINGE | INTRAVENOUS | Status: AC
  Filled 2015-08-23: qty 10

## 2015-08-23 MED ORDER — LACTATED RINGERS IV SOLN
INTRAVENOUS | Status: DC
  Administered 2015-08-23 (×2): via INTRAVENOUS

## 2015-08-23 MED ORDER — ROCURONIUM BROMIDE 100 MG/10ML IV SOLN
INTRAVENOUS | Status: DC | PRN
  Administered 2015-08-23: 5 mg via INTRAVENOUS

## 2015-08-23 MED ORDER — EPHEDRINE SULFATE 50 MG/ML IJ SOLN
INTRAMUSCULAR | Status: DC | PRN
  Administered 2015-08-23: 5 mg via INTRAVENOUS

## 2015-08-23 MED ORDER — OXYTOCIN 10 UNIT/ML IJ SOLN
INTRAMUSCULAR | Status: AC
  Filled 2015-08-23: qty 4

## 2015-08-23 MED ORDER — SODIUM CHLORIDE 0.9 % IR SOLN
Status: DC | PRN
  Administered 2015-08-23: 1000 mL

## 2015-08-23 MED ORDER — DEXMEDETOMIDINE BOLUS VIA INFUSION
1.0000 ug/kg | Freq: Once | INTRAVENOUS | Status: AC
  Administered 2015-08-23 (×7): 8 ug via INTRAVENOUS
  Filled 2015-08-23 (×2): qty 56

## 2015-08-23 MED ORDER — GLYCOPYRROLATE 0.2 MG/ML IJ SOLN
INTRAMUSCULAR | Status: AC
  Filled 2015-08-23: qty 1

## 2015-08-23 MED ORDER — PHENYLEPHRINE HCL 10 MG/ML IJ SOLN
INTRAMUSCULAR | Status: DC | PRN
  Administered 2015-08-23 (×2): 40 ug via INTRAVENOUS

## 2015-08-23 MED ORDER — HYDROMORPHONE HCL 1 MG/ML IJ SOLN
1.0000 mg | INTRAMUSCULAR | Status: DC | PRN
Start: 2015-08-23 — End: 2015-08-23
  Administered 2015-08-23 (×2): 1 mg via INTRAVENOUS

## 2015-08-23 MED ORDER — SUCCINYLCHOLINE CHLORIDE 20 MG/ML IJ SOLN
INTRAMUSCULAR | Status: AC
  Filled 2015-08-23: qty 1

## 2015-08-23 MED ORDER — LIDOCAINE HCL (CARDIAC) 20 MG/ML IV SOLN
INTRAVENOUS | Status: DC | PRN
  Administered 2015-08-23: 60 mg via INTRAVENOUS

## 2015-08-23 MED ORDER — LACTATED RINGERS IV SOLN: INTRAVENOUS | Status: DC

## 2015-08-23 MED ORDER — METOCLOPRAMIDE HCL 5 MG/ML IJ SOLN
INTRAMUSCULAR | Status: AC
  Filled 2015-08-23: qty 2

## 2015-08-23 MED ORDER — OXYTOCIN 10 UNIT/ML IJ SOLN
40.0000 [IU] | INTRAVENOUS | Status: DC | PRN
  Administered 2015-08-23: 40 [IU] via INTRAVENOUS

## 2015-08-23 MED ORDER — PROPOFOL 10 MG/ML IV BOLUS
INTRAVENOUS | Status: DC | PRN
  Administered 2015-08-23: 150 mg via INTRAVENOUS

## 2015-08-23 MED ORDER — ONDANSETRON HCL 4 MG/2ML IJ SOLN
INTRAMUSCULAR | Status: DC | PRN
  Administered 2015-08-23: 4 mg via INTRAVENOUS

## 2015-08-23 MED ORDER — IBUPROFEN 600 MG PO TABS: 600.0000 mg | ORAL_TABLET | Freq: Four times a day (QID) | ORAL | Status: AC | PRN

## 2015-08-23 MED ORDER — ACETAMINOPHEN 10 MG/ML IV SOLN
1000.0000 mg | Freq: Once | INTRAVENOUS | Status: AC
  Administered 2015-08-23: 1000 mg via INTRAVENOUS
  Filled 2015-08-23: qty 100

## 2015-08-23 SURGICAL SUPPLY — 19 items
CATH ROBINSON RED A/P 16FR (CATHETERS) ×3 IMPLANT
DECANTER SPIKE VIAL GLASS SM (MISCELLANEOUS) ×3 IMPLANT
GLOVE BIOGEL PI IND STRL 6.5 (GLOVE) ×1 IMPLANT
GLOVE BIOGEL PI IND STRL 7.0 (GLOVE) ×1 IMPLANT
GLOVE BIOGEL PI INDICATOR 6.5 (GLOVE) ×2
GLOVE BIOGEL PI INDICATOR 7.0 (GLOVE) ×2
GLOVE SURG SS PI 6.0 STRL IVOR (GLOVE) ×3 IMPLANT
GOWN STRL REUS W/TWL LRG LVL3 (GOWN DISPOSABLE) ×6 IMPLANT
KIT BERKELEY 1ST TRIMESTER 3/8 (MISCELLANEOUS) ×3 IMPLANT
NS IRRIG 1000ML POUR BTL (IV SOLUTION) ×3 IMPLANT
PACK VAGINAL MINOR WOMEN LF (CUSTOM PROCEDURE TRAY) ×3 IMPLANT
PAD OB MATERNITY 4.3X12.25 (PERSONAL CARE ITEMS) ×3 IMPLANT
PAD PREP 24X48 CUFFED NSTRL (MISCELLANEOUS) ×3 IMPLANT
SET BERKELEY SUCTION TUBING (SUCTIONS) ×5 IMPLANT
TOWEL OR 17X24 6PK STRL BLUE (TOWEL DISPOSABLE) ×6 IMPLANT
VACURETTE 10 RIGID CVD (CANNULA) ×2 IMPLANT
VACURETTE 7MM CVD STRL WRAP (CANNULA) IMPLANT
VACURETTE 8 RIGID CVD (CANNULA) IMPLANT
VACURETTE 9 RIGID CVD (CANNULA) IMPLANT

## 2015-08-23 NOTE — Anesthesia Postprocedure Evaluation (Signed)
Anesthesia Post Note  Patient: Hospital doctorAmber M Rajagopalan  Procedure(s) Performed: Procedure(s) (LRB): DILATATION AND EVACUATION (N/A)  Patient location during evaluation: PACU Anesthesia Type: General Level of consciousness: awake and alert Pain management: pain level controlled Vital Signs Assessment: post-procedure vital signs reviewed and stable Respiratory status: spontaneous breathing, nonlabored ventilation, respiratory function stable and patient connected to nasal cannula oxygen Cardiovascular status: blood pressure returned to baseline and stable Postop Assessment: no signs of nausea or vomiting Anesthetic complications: no     Last Vitals:  Filed Vitals:   08/23/15 1545 08/23/15 1600  BP: 97/58 90/48  Pulse: 85 70  Temp:    Resp: 15 10    Last Pain:  Filed Vitals:   08/23/15 1614  PainSc: 4    Pain Goal:                 Phillips Groutarignan, Liat Mayol

## 2015-08-23 NOTE — H&P (Signed)
Chelsea May is an 30 y.o. female. Unknown [id number] Patient is scheduled for D&C for US diagnosis of probable molar pregnancy, c/w sever nausea and vomiting  Pertinent Gynecological History:  Bleeding: no   Blood transfusions: none Sexually transmitted diseases: no past history Previous GYN Procedures: no         Menstrual History: Menarche age:  Patient's last menstrual period was 05/03/2015.    Past Medical History  Diagnosis Date  . Substance abuse     detox 05/2011 opiates - now marijuana/xanax  . Hx of nausea and vomiting   . Rhabdomyolysis   . Myonecrosis   . Hematuria   . SVD (spontaneous vaginal delivery)     x 2  . Heart murmur     as child, no problem as adult  . Anxiety     no meds    Past Surgical History  Procedure Laterality Date  . Back surgery      "fatty tumor" removed from her back    Family History  Problem Relation Age of Onset  . Muscular dystrophy Other   . Lupus Other   . Heart disease Other     Social History:  reports that she has been smoking Cigarettes.  She has a 10 pack-year smoking history. She has never used smokeless tobacco. She reports that she uses illicit drugs (Marijuana and Other-see comments). She reports that she does not drink alcohol.  Allergies:  Allergies  Allergen Reactions  . Hydrocodone Itching and Nausea And Vomiting    Patient states that she gets a metal taste in her mouth.  . Tomato Other (See Comments)    Too acidic, gives patient sores in mouth     Prescriptions prior to admission  Medication Sig Dispense Refill Last Dose  . promethazine (PHENERGAN) 25 MG tablet Take 1 tablet (25 mg total) by mouth every 6 (six) hours as needed for nausea or vomiting. 12 tablet 0 Past Month at Unknown time  . metoCLOPramide (REGLAN) 10 MG tablet Take 1 tablet (10 mg total) by mouth every 6 (six) hours as needed for nausea or vomiting. (Patient not taking: Reported on 08/20/2015) 30 tablet 0 Completed Course at  Unknown time  . potassium chloride SA (K-DUR,KLOR-CON) 20 MEQ tablet Take 2 tablets (40 mEq total) by mouth 2 (two) times daily. (Patient not taking: Reported on 08/20/2015) 10 tablet 0 Not Taking at Unknown time    Review of Systems  Constitutional: Negative.   Respiratory: Negative.   Gastrointestinal: Positive for nausea and vomiting.  Genitourinary: Negative.     Blood pressure 116/77, pulse 105, temperature 97.7 F (36.5 C), temperature source Oral, resp. rate 20, height 5\' 4"  (1.626 m), weight 55.339 kg (122 lb), last menstrual period 05/03/2015, SpO2 100 %, not currently breastfeeding. Physical Exam  Vitals reviewed. Constitutional: She is oriented to person, place, and time. She appears well-developed. No distress.  Cardiovascular: Normal rate.   Respiratory: Effort normal. No respiratory distress.  GI: She exhibits no distension.  Neurological: She is alert and oriented to person, place, and time.  Skin: Skin is warm and dry.  Psychiatric: She has a normal mood and affect. Her behavior is normal.    No results found for this or any previous visit (from the past 24 hour(s)).  No results found.  Assessment/Plan: Suspected molar pregnancy for suction D&C today.The risks of surgery were discussed in detail with the patient including but not limited to: bleeding which may require transfusion or reoperation; infection which  may require prolonged hospitalization or re-hospitalization and antibiotic therapy; injury to bowel, bladder, ureters and major vessels or other surrounding organs; need for additional procedures including laparotomy; thromboembolic phenomenon, incisional problems and other postoperative or anesthesia complications.  Patient was told that the likelihood that her condition and symptoms will be treated effectively with this surgical management was very high; the postoperative expectations were also discussed in detail. The patient also understands the alternative  treatment options which were discussed in full. All questions were answered.     Chelsea May 08/23/2015, 1:28 PM

## 2015-08-23 NOTE — Transfer of Care (Signed)
Immediate Anesthesia Transfer of Care Note  Patient: Chelsea May  Procedure(s) Performed: Procedure(s) with comments: DILATATION AND EVACUATION (N/A) - Molar Pregnancy   Patient Location: PACU  Anesthesia Type:General  Level of Consciousness: awake, alert , oriented and patient cooperative  Airway & Oxygen Therapy: Patient Spontanous Breathing and Patient connected to nasal cannula oxygen  Post-op Assessment: Report given to RN and Post -op Vital signs reviewed and stable  Post vital signs: Reviewed and stable  Last Vitals:  Filed Vitals:   08/23/15 1245 08/23/15 1444  BP: 116/77   Pulse: 105   Temp: 36.5 C 36.8 C  Resp: 20     Last Pain: There were no vitals filed for this visit.       Complications: No apparent anesthesia complications

## 2015-08-23 NOTE — Anesthesia Procedure Notes (Signed)
Procedure Name: Intubation Date/Time: 08/23/2015 2:11 PM Performed by: Yolonda KidaARVER, Master Touchet L Pre-anesthesia Checklist: Patient identified, Patient being monitored, Emergency Drugs available and Suction available Patient Re-evaluated:Patient Re-evaluated prior to inductionOxygen Delivery Method: Circle system utilized Preoxygenation: Pre-oxygenation with 100% oxygen Intubation Type: IV induction, Rapid sequence and Cricoid Pressure applied Laryngoscope Size: Mac and 3 Grade View: Grade I Tube type: Oral Tube size: 7.0 mm Number of attempts: 1 Airway Equipment and Method: Stylet Placement Confirmation: ETT inserted through vocal cords under direct vision,  breath sounds checked- equal and bilateral,  positive ETCO2 and CO2 detector Secured at: 21 cm Tube secured with: Tape Dental Injury: Teeth and Oropharynx as per pre-operative assessment

## 2015-08-23 NOTE — Anesthesia Preprocedure Evaluation (Addendum)
Anesthesia Evaluation  Patient identified by MRN, date of birth, ID band Patient awake    Reviewed: Allergy & Precautions, NPO status , Patient's Chart, lab work & pertinent test results  Airway Mallampati: II  TM Distance: >3 FB Neck ROM: Full    Dental no notable dental hx.    Pulmonary Current Smoker,    Pulmonary exam normal breath sounds clear to auscultation       Cardiovascular negative cardio ROS Normal cardiovascular exam Rhythm:Regular Rate:Normal     Neuro/Psych negative neurological ROS  negative psych ROS   GI/Hepatic negative GI ROS, (+)     substance abuse (h/o heroin use. clean x 3 years. off suboxone x1 year)  IV drug use,   Endo/Other  negative endocrine ROS  Renal/GU negative Renal ROS  negative genitourinary   Musculoskeletal negative musculoskeletal ROS (+)   Abdominal   Peds negative pediatric ROS (+)  Hematology negative hematology ROS (+)   Anesthesia Other Findings   Reproductive/Obstetrics (+) Pregnancy                           Anesthesia Physical Anesthesia Plan  ASA: II  Anesthesia Plan: General   Post-op Pain Management:    Induction: Intravenous, Rapid sequence and Cricoid pressure planned  Airway Management Planned: Oral ETT  Additional Equipment:   Intra-op Plan:   Post-operative Plan: Extubation in OR  Informed Consent: I have reviewed the patients History and Physical, chart, labs and discussed the procedure including the risks, benefits and alternatives for the proposed anesthesia with the patient or authorized representative who has indicated his/her understanding and acceptance.   Dental advisory given  Plan Discussed with: CRNA  Anesthesia Plan Comments: (Will use precedex to attenuate pain response)       Anesthesia Quick Evaluation

## 2015-08-23 NOTE — Discharge Instructions (Signed)
Dilation and Curettage or Vacuum Curettage Dilation and curettage (D&C) and vacuum curettage are minor procedures. A D&C involves stretching (dilation) the cervix and scraping (curettage) the inside lining of the womb (uterus). During a D&C, tissue is gently scraped from the inside lining of the uterus. During a vacuum curettage, the lining and tissue in the uterus are removed with the use of gentle suction.  Curettage may be performed to either diagnose or treat a problem. As a diagnostic procedure, curettage is performed to examine tissues from the uterus. A diagnostic curettage may be performed for the following symptoms:   Irregular bleeding in the uterus.   Bleeding with the development of clots.   Spotting between menstrual periods.   Prolonged menstrual periods.   Bleeding after menopause.   No menstrual period (amenorrhea).   A change in size and shape of the uterus.  As a treatment procedure, curettage may be performed for the following reasons:   Removal of an IUD (intrauterine device).   Removal of retained placenta after giving birth. Retained placenta can cause an infection or bleeding severe enough to require transfusions.   Abortion.   Miscarriage.   Removal of polyps inside the uterus.   Removal of uncommon types of noncancerous lumps (fibroids).  LET [facility] CARE PROVIDER KNOW ABOUT:   Any allergies you have.   All medicines you are taking, including vitamins, herbs, eye drops, creams, and over-the-counter medicines.   Previous problems you or members of your family have had with the use of anesthetics.   Any blood disorders you have.   Previous surgeries you have had.   Medical conditions you have. RISKS AND COMPLICATIONS  Generally, this is a safe procedure. However, as with any procedure, complications can occur. Possible complications include:  Excessive bleeding.   Infection of the uterus.   Damage to the cervix.    Development of scar tissue (adhesions) inside the uterus, later causing abnormal amounts of menstrual bleeding.   Complications from the general anesthetic, if a general anesthetic is used.   Putting a hole (perforation) in the uterus. This is rare.  BEFORE THE PROCEDURE   Eat and drink before the procedure only as directed by your health care provider.   Arrange for someone to take you home.  PROCEDURE  This procedure usually takes about 15-30 minutes.  You will be given one of the following:  A medicine that numbs the area in and around the cervix (local anesthetic).   A medicine to make you sleep through the procedure (general anesthetic).  You will lie on your back with your legs in stirrups.   A warm metal or plastic instrument (speculum) will be placed in your vagina to keep it open and to allow the health care provider to see the cervix.  There are two ways in which your cervix can be softened and dilated. These include:   Taking a medicine.   Having thin rods (laminaria) inserted into your cervix.   A curved tool (curette) will be used to scrape cells from the inside lining of the uterus. In some cases, gentle suction is applied with the curette. The curette will then be removed.  AFTER THE PROCEDURE   You will rest in the recovery area until you are stable and are ready to go home.   You may feel sick to your stomach (nauseous) or throw up (vomit) if you were given a general anesthetic.   You may have a sore throat if a tube  was placed in your throat during general anesthesia.   You may have light cramping and bleeding. This may last for 2 days to 2 weeks after the procedure.   Your uterus needs to make a new lining after the procedure. This may make your next period late.   This information is not intended to replace advice given to you by your health care provider. Make sure you discuss any questions you have with your health care provider.    Document Released: 03/30/2005 Document Revised: 11/30/2012 Document Reviewed: 10/27/2012 Elsevier Interactive Patient Education 2016 [employer]  DISCHARGE INSTRUCTIONS: D&C / D&E The following instructions have been prepared to help you care for yourself upon your return home.   Personal hygiene:  Use sanitary pads for vaginal drainage, not tampons.  Shower the day after your procedure.  NO tub baths, pools or Jacuzzis for 2-3 weeks.  Wipe front to back after using the bathroom.  Activity and limitations:  Do NOT drive or operate any equipment for 24 hours. The effects of anesthesia are still present and drowsiness may result.  Do NOT rest in bed all day.  Walking is encouraged.  Walk up and down stairs slowly.  You may resume your normal activity in one to two days or as indicated by your physician.  Sexual activity: NO intercourse for at least 2 weeks after the procedure, or as indicated by your physician.  Diet: Eat a light meal as desired this evening. You may resume your usual diet tomorrow.  Return to work: You may resume your work activities in one to two days or as indicated by your doctor.  What to expect after your surgery: Expect to have vaginal bleeding/discharge for 2-3 days and spotting for up to 10 days. It is not unusual to have soreness for up to 1-2 weeks. You may have a slight burning sensation when you urinate for the first day. Mild cramps may continue for a couple of days. You may have a regular period in 2-6 weeks.  Call your doctor for any of the following:  Excessive vaginal bleeding, saturating and changing one pad every hour.  Inability to urinate 6 hours after discharge from hospital.  Pain not relieved by pain medication.  Fever of 100.4 F or greater.  Unusual vaginal discharge or odor.   Call for an appointment:    Patients signature: ______________________  Nurses signature ________________________  Support person's  signature_______________________    Post Anesthesia Home Care Instructions  Activity: Get plenty of rest for the remainder of the day. A responsible adult should stay with you for 24 hours following the procedure.  For the next 24 hours, DO NOT: -Drive a car -Advertising copywriterperate machinery -Drink alcoholic beverages -Take any medication unless instructed by your physician -Make any legal decisions or sign important papers.  Meals: Start with liquid foods such as gelatin or soup. Progress to regular foods as tolerated. Avoid greasy, spicy, heavy foods. If nausea and/or vomiting occur, drink only clear liquids until the nausea and/or vomiting subsides. Call your physician if vomiting continues.  Special Instructions/Symptoms: Your throat may feel dry or sore from the anesthesia or the breathing tube placed in your throat during surgery. If this causes discomfort, gargle with warm salt water. The discomfort should disappear within 24 hours.  If you had a scopolamine patch placed behind your ear for the management of post- operative nausea and/or vomiting:  1. The medication in the patch is effective for 72 hours, after which it should  be removed.  Wrap patch in a tissue and discard in the trash. Wash hands thoroughly with soap and water. 2. You may remove the patch earlier than 72 hours if you experience unpleasant side effects which may include dry mouth, dizziness or visual disturbances. 3. Avoid touching the patch. Wash your hands with soap and water after contact with the patch.

## 2015-08-23 NOTE — Op Note (Signed)
Donda M Burstein PROCEDURE DATE: 08/23/2015  PREOPERATIVE DIAGNOSIS: molar pregnancy  POSTOPERATIVE DIAGNOSIS: The same PROCEDURE:     Dilation and Evacuation SURGEON:   Adam PhenixJames G Jarely Juncaj, MD   INDICATIONS: 30 y.o. [phone] with molar pregnancy at 11.[redacted] weeks gestation, needing surgical completion.  Risks of surgery were discussed with the patient including but not limited to: bleeding which may require transfusion; infection which may require antibiotics; injury to uterus or surrounding organs; need for additional procedures including laparotomy or laparoscopy; possibility of intrauterine scarring which may impair future fertility; and other postoperative/anesthesia complications. Written informed consent was obtained.    FINDINGS:  A 6 week size uterus, moderate amounts of products of conception, specimen sent to pathology.  ANESTHESIA:    Monitored intravenous sedation, paracervical block. INTRAVENOUS FLUIDS:  1000 ml of LR ESTIMATED BLOOD LOSS:  Less than 20 ml. SPECIMENS:  Products of conception sent to pathology COMPLICATIONS:  None immediate.  PROCEDURE DETAILS:  The patient received intravenous Doxycycline while in the preoperative area.  She was then taken to the operating room where monitored intravenous sedation was administered and was found to be adequate.  After an adequate timeout was performed, she was placed in the dorsal lithotomy position and examined; then prepped and draped in the sterile manner.   Her bladder was catheterized for an unmeasured amount of clear, yellow urine. A vaginal speculum was then placed in the patient's vagina and a single tooth tenaculum was applied to the anterior lip of the cervix. The cervix was gently dilated to accommodate a 10 mm suction curette that was gently advanced to the uterine fundus.  The suction device was then activated and curette slowly rotated to clear the uterus of products of conception.   There was minimal bleeding noted and the  tenaculum removed with good hemostasis noted.   All instruments were removed from the patient's vagina.  Sponge and instrument counts were correct times two  The patient tolerated the procedure well and was taken to the recovery area awake, and in stable condition.  The patient will be discharged to home as per PACU criteria.  Routine postoperative instructions given.  She was prescribed Tramadol, Ibuprofen and Colace.  She will follow up in the clinic on 2 for postoperative evaluation and repeat HCG.   Adam PhenixJames G Alexiana Laverdure, MD, FACOG Attending Obstetrician & Gynecologist Faculty Practice, [facility] [place]

## 2015-08-26 ENCOUNTER — Encounter (HOSPITAL_COMMUNITY): Payer: Self-pay | Admitting: Obstetrics & Gynecology

## 2015-09-12 ENCOUNTER — Telehealth: Payer: Self-pay | Admitting: *Deleted

## 2015-09-12 ENCOUNTER — Ambulatory Visit: Payer: Self-pay | Admitting: Obstetrics & Gynecology

## 2015-09-12 NOTE — Telephone Encounter (Signed)
Per Dr Debroah LoopArnold, called patient re: missed f/u appointment this morning, does she want to reschedule?. Her phone number rang once and then heard message stating no voice mail was set up. Will try again later.

## 2015-09-12 DEATH — deceased

## 2015-09-13 NOTE — Telephone Encounter (Signed)
Attempted to contact pt VM box not set up.  Letter sent.

## 2015-09-16 ENCOUNTER — Other Ambulatory Visit: Payer: Self-pay | Admitting: *Deleted
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
## 2018-01-03 IMAGING — US US OB COMP LESS 14 WK
1 series · 14 of 28 positions shown · non-contrast
Comparison: None.

CLINICAL DATA: Vomiting, pregnant

EXAM:
OBSTETRIC <14 WK US AND TRANSVAGINAL OB US
TECHNIQUE: Both transabdominal and transvaginal ultrasound examinations were
performed for complete evaluation of the gestation as well as the
maternal uterus, adnexal regions, and pelvic cul-de-sac.
Transvaginal technique was performed to assess early pregnancy.

[Series 1: us ob comp less 14 wk · 0.25mm/px · 14 of 58 slices shown]
[im 3/58]
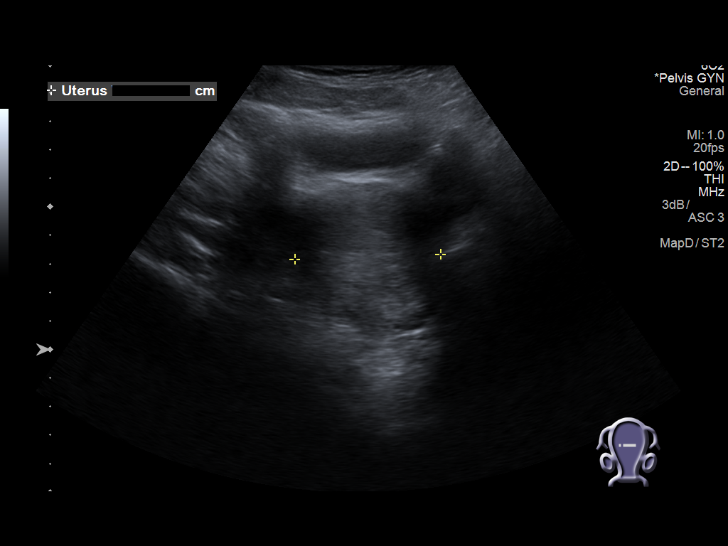
[im 7/58]
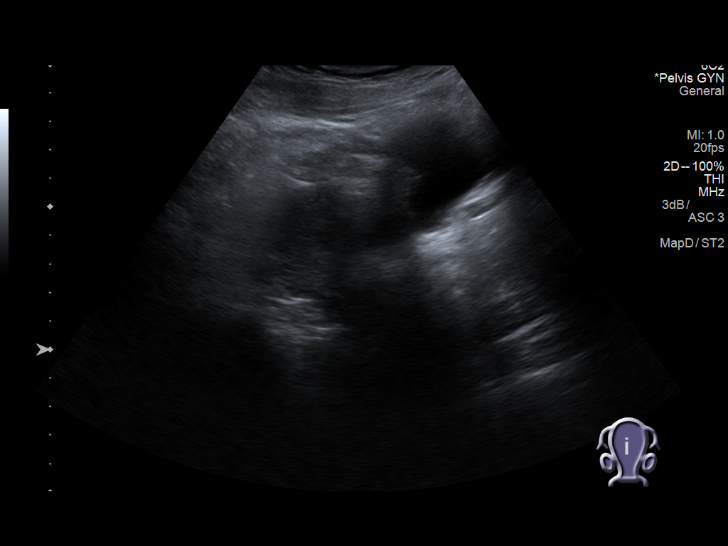
[im 11/58]
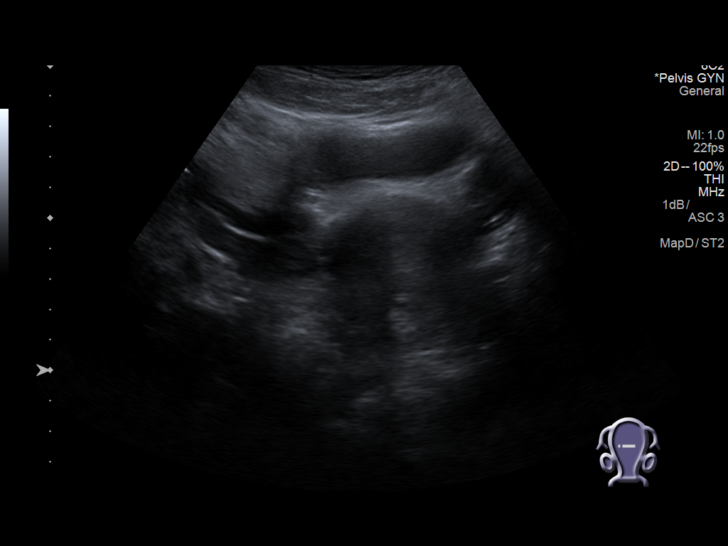
[im 15/58]
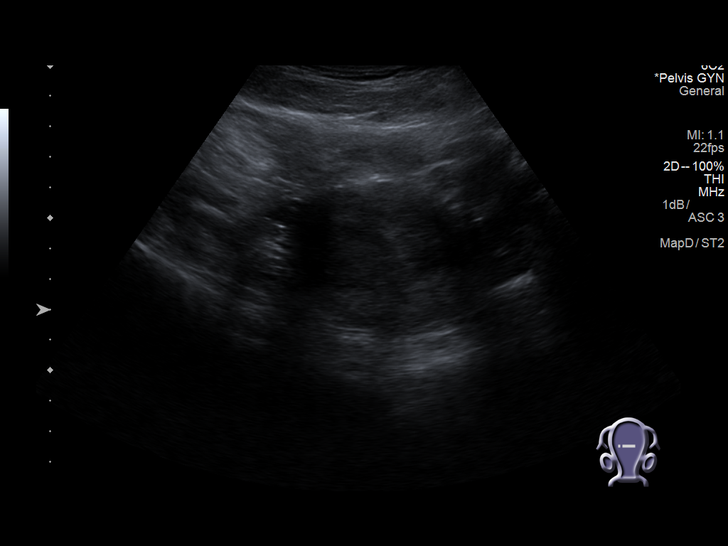
[im 20/58]
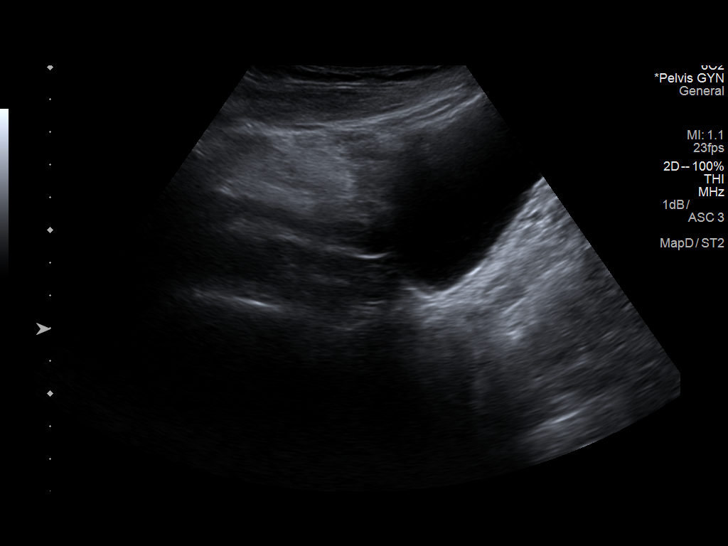
[im 24/58]
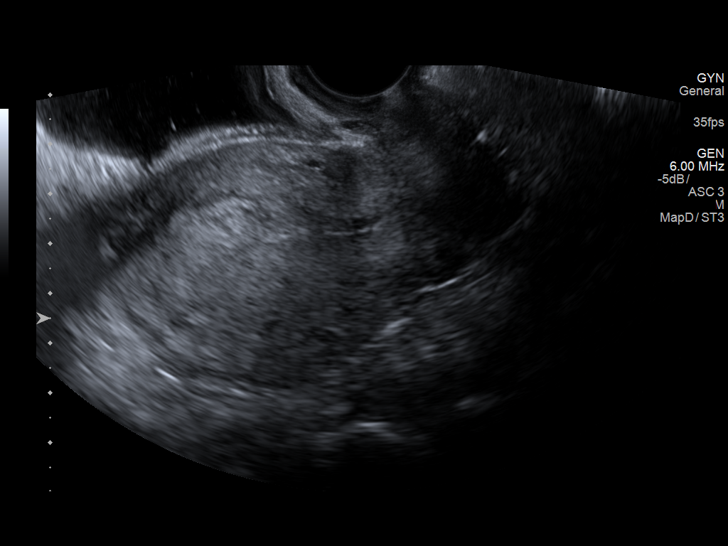
[im 28/58]
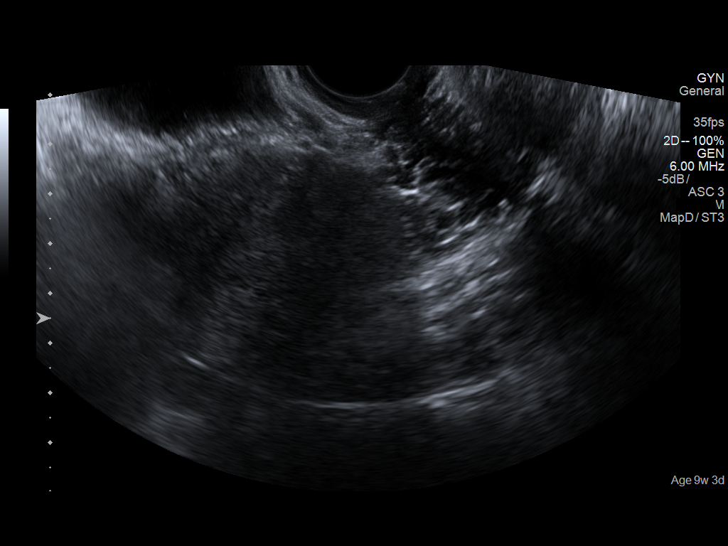
[im 32/58]
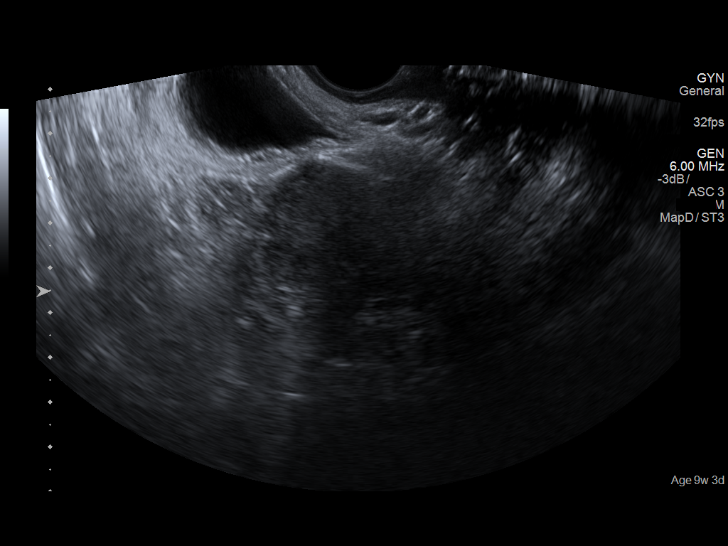
[im 36/58]
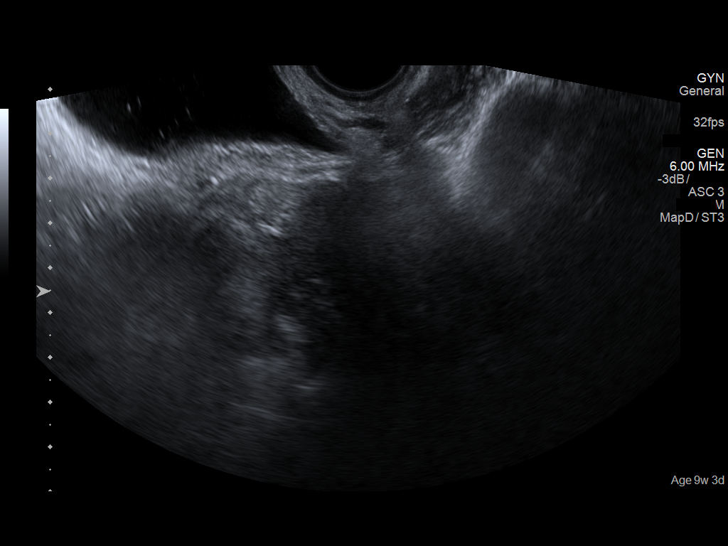
[im 41/58]
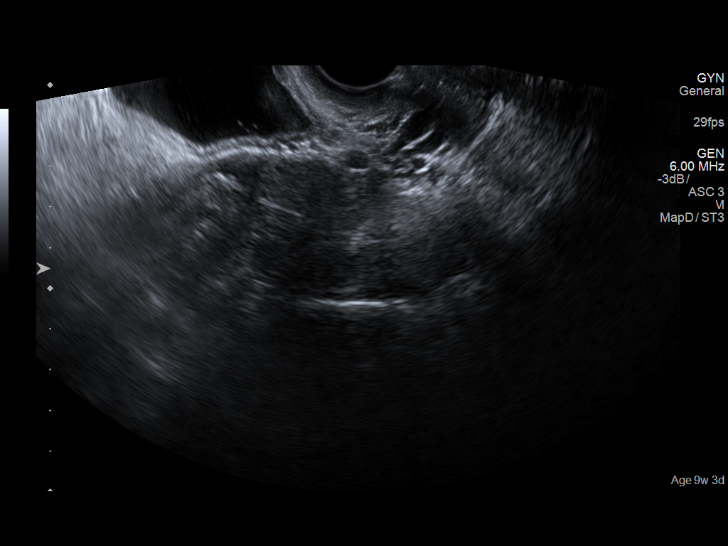
[im 45/58]
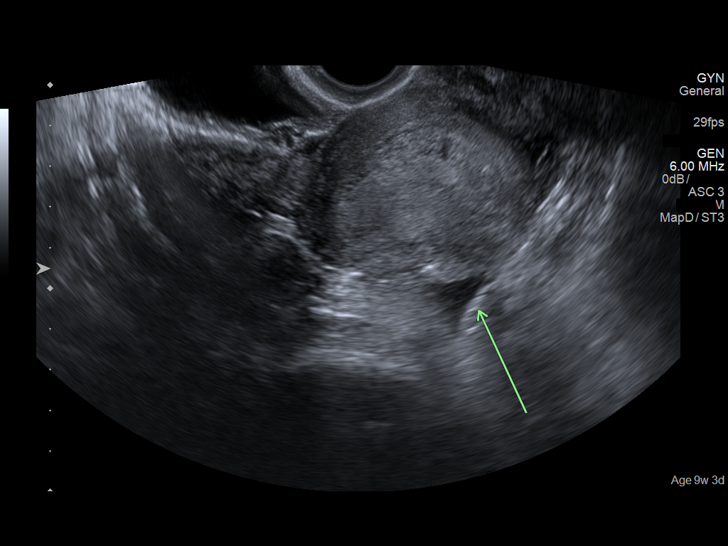
[im 49/58]
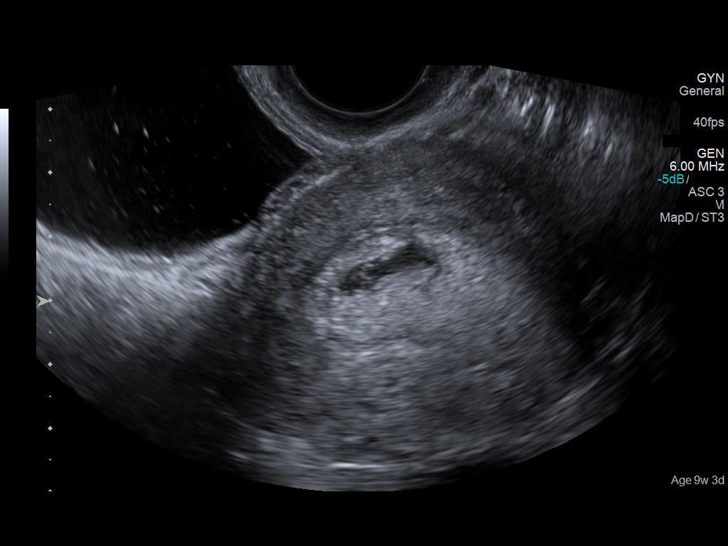
[im 53/58]
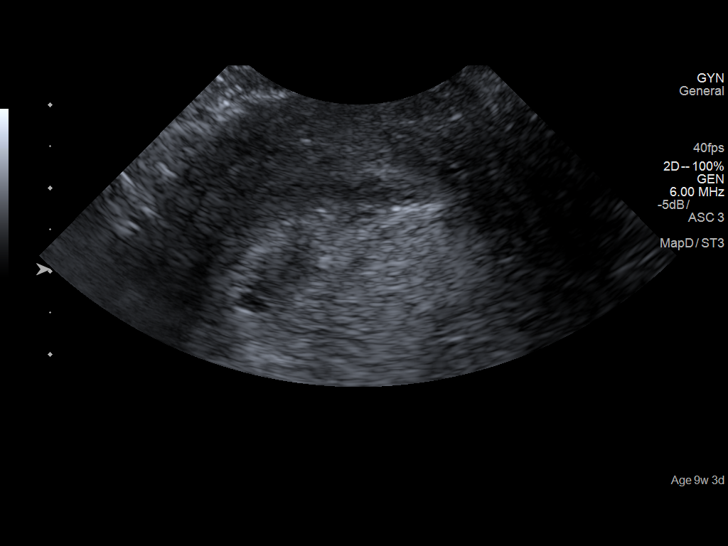
[im 58/58]
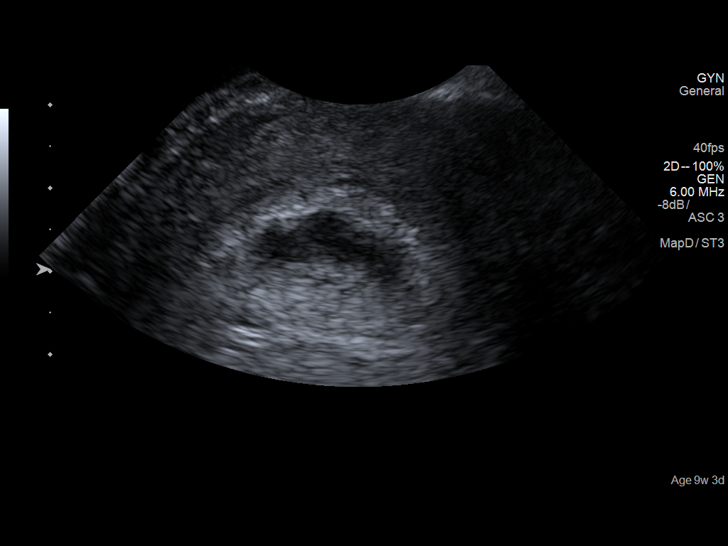

[14 of 28 positions shown; findings below may reference images not displayed]

FINDINGS: Intrauterine gestational sac: Visualized/normal in shape.

Yolk sac:  Present

Embryo:  Not present

Cardiac Activity: Not present

MSD: 15.2  mm   6 w   2  d

Subchorionic hemorrhage:  None visualized.

Maternal uterus/adnexae: No adnexal mass. Normal bilateral ovaries.
Small pelvic free fluid.
IMPRESSION: 1. Probable early intrauterine gestational sac, but fetal pole or
cardiac activity yet visualized. Recommend follow-up quantitative
B-HCG levels and follow-up US in 14 days to confirm and assess
viability. This recommendation follows SRU consensus guidelines:
Diagnostic Criteria for Nonviable Pregnancy Early in the First
Trimester. N Engl J Med 8678; [DATE].
